# Patient Record
Sex: Female | Born: 1935 | Race: White | Hispanic: No | Marital: Single | Smoking: Former smoker
Health system: Southern US, Community
[De-identification: ages and names within clinical notes are randomized; demographics above are authoritative.]

## PROBLEM LIST (undated history)

## (undated) DIAGNOSIS — E079 Disorder of thyroid, unspecified: Secondary | ICD-10-CM

## (undated) DIAGNOSIS — T4145XA Adverse effect of unspecified anesthetic, initial encounter: Secondary | ICD-10-CM

## (undated) DIAGNOSIS — J9610 Chronic respiratory failure, unspecified whether with hypoxia or hypercapnia: Secondary | ICD-10-CM

## (undated) DIAGNOSIS — J189 Pneumonia, unspecified organism: Secondary | ICD-10-CM

## (undated) DIAGNOSIS — G10 Huntington's disease: Secondary | ICD-10-CM

## (undated) DIAGNOSIS — I4891 Unspecified atrial fibrillation: Secondary | ICD-10-CM

## (undated) DIAGNOSIS — K219 Gastro-esophageal reflux disease without esophagitis: Secondary | ICD-10-CM

## (undated) DIAGNOSIS — T8859XA Other complications of anesthesia, initial encounter: Secondary | ICD-10-CM

## (undated) HISTORY — PX: TONSILLECTOMY: SUR1361

## (undated) HISTORY — PX: APPENDECTOMY: SHX54

## (undated) HISTORY — DX: Disorder of thyroid, unspecified: E07.9

## (undated) HISTORY — PX: HEMORRHOID SURGERY: SHX153

## (undated) HISTORY — PX: ABDOMINAL HYSTERECTOMY: SHX81

## (undated) HISTORY — DX: Gastro-esophageal reflux disease without esophagitis: K21.9

## (undated) HISTORY — PX: COLON SURGERY: SHX602

---

## 1997-10-24 ENCOUNTER — Encounter (HOSPITAL_COMMUNITY): Admission: RE | Admit: 1997-10-24 | Discharge: 1998-01-22 | Payer: Self-pay | Admitting: Cardiology

## 1998-01-23 ENCOUNTER — Encounter (HOSPITAL_COMMUNITY): Admission: RE | Admit: 1998-01-23 | Discharge: 1998-04-23 | Payer: Self-pay | Admitting: Cardiology

## 1998-06-13 ENCOUNTER — Other Ambulatory Visit: Admission: RE | Admit: 1998-06-13 | Discharge: 1998-06-13 | Payer: Self-pay | Admitting: *Deleted

## 1998-08-03 ENCOUNTER — Other Ambulatory Visit: Admission: RE | Admit: 1998-08-03 | Discharge: 1998-08-03 | Payer: Self-pay | Admitting: Urology

## 1999-06-25 ENCOUNTER — Other Ambulatory Visit: Admission: RE | Admit: 1999-06-25 | Discharge: 1999-06-25 | Payer: Self-pay | Admitting: *Deleted

## 2001-04-28 ENCOUNTER — Ambulatory Visit (HOSPITAL_COMMUNITY): Admission: RE | Admit: 2001-04-28 | Discharge: 2001-04-28 | Payer: Self-pay | Admitting: Gastroenterology

## 2001-06-21 ENCOUNTER — Emergency Department (HOSPITAL_COMMUNITY): Admission: EM | Admit: 2001-06-21 | Discharge: 2001-06-21 | Payer: Self-pay | Admitting: Emergency Medicine

## 2001-06-21 ENCOUNTER — Encounter: Payer: Self-pay | Admitting: Emergency Medicine

## 2002-09-24 ENCOUNTER — Emergency Department (HOSPITAL_COMMUNITY): Admission: EM | Admit: 2002-09-24 | Discharge: 2002-09-25 | Payer: Self-pay | Admitting: Emergency Medicine

## 2002-09-24 ENCOUNTER — Encounter: Payer: Self-pay | Admitting: Emergency Medicine

## 2002-12-07 ENCOUNTER — Encounter: Admission: RE | Admit: 2002-12-07 | Discharge: 2002-12-07 | Payer: Self-pay | Admitting: Geriatric Medicine

## 2002-12-07 ENCOUNTER — Encounter: Payer: Self-pay | Admitting: Geriatric Medicine

## 2003-06-10 ENCOUNTER — Emergency Department (HOSPITAL_COMMUNITY): Admission: EM | Admit: 2003-06-10 | Discharge: 2003-06-11 | Payer: Self-pay | Admitting: Emergency Medicine

## 2005-12-23 ENCOUNTER — Encounter: Admission: RE | Admit: 2005-12-23 | Discharge: 2005-12-23 | Payer: Self-pay | Admitting: Geriatric Medicine

## 2007-02-16 ENCOUNTER — Ambulatory Visit (HOSPITAL_COMMUNITY): Admission: RE | Admit: 2007-02-16 | Discharge: 2007-02-16 | Payer: Self-pay | Admitting: Geriatric Medicine

## 2007-06-01 ENCOUNTER — Encounter: Admission: RE | Admit: 2007-06-01 | Discharge: 2007-06-01 | Payer: Self-pay | Admitting: Geriatric Medicine

## 2009-03-01 ENCOUNTER — Encounter: Admission: RE | Admit: 2009-03-01 | Discharge: 2009-03-01 | Payer: Self-pay | Admitting: Geriatric Medicine

## 2009-04-12 ENCOUNTER — Encounter: Admission: RE | Admit: 2009-04-12 | Discharge: 2009-04-12 | Payer: Self-pay | Admitting: Internal Medicine

## 2009-06-21 ENCOUNTER — Emergency Department (HOSPITAL_COMMUNITY): Admission: EM | Admit: 2009-06-21 | Discharge: 2009-06-21 | Payer: Self-pay | Admitting: Emergency Medicine

## 2010-02-25 ENCOUNTER — Emergency Department (HOSPITAL_BASED_OUTPATIENT_CLINIC_OR_DEPARTMENT_OTHER): Admission: EM | Admit: 2010-02-25 | Discharge: 2010-02-25 | Payer: Self-pay | Admitting: Emergency Medicine

## 2010-02-25 ENCOUNTER — Ambulatory Visit: Payer: Self-pay | Admitting: Diagnostic Radiology

## 2010-12-13 NOTE — Procedures (Signed)
[facility] [place]  Patient:    Sydney [name], Sydney [name] Visit Number: [id number] MRN: [id number]          Service Type: Attending:  Verlin Grills, M.D. Dictated by:   Verlin Grills, M.D. Proc. Date: 04/28/01   CC:         Hal T. Stoneking, M.D.  Julieanne Manson, M.D.  Gita Kudo, M.D.   Procedure Report  PROCEDURE:  Colonoscopy.  INDICATIONS FOR PROCEDURE:  The patient (date of birth, May 20, 1935) is a 76 year old female with Huntington chorea, and chronic atrial fibrillation. She chronically takes Coumadin.  In April 1999, she underwent a segmental sigmoid colon resection to remove a tubulovillous adenoma of the sigmoid colon.  She is due for surveillance colonoscopy with polypectomy to prevent colon cancer.  ENDOSCOPIST:  Verlin Grills, M.D.  PREMEDICATION:  Fentanyl 50 mcg and Versed 5 mg.  ENDOSCOPE:  Olympus pediatric colonoscope.  DESCRIPTION OF PROCEDURE:  After obtaining informed consent, the patient was placed in the left lateral decubitus position.  I administered intravenous Versed and intravenous fentanyl to achieve conscious sedation for the procedure.  The patients blood pressure, oxygen saturation, and cardiac rhythm were monitored throughout the procedure, and documented in the medical record.  Anal inspection was normal.  Digital rectal exam was normal.  The Olympus pediatric video colonoscope was introduced into the rectum and easily advanced to the cecum.  Colonic preparation was excellent for the exam today.  The patient consumed a Fleets Phospho-Soda prep.  Rectum normal.  Sigmoid colon and descending colon normal.  The surgical anastomosis was easily identified and appeared completely normal.  Splenic flexure normal.  Transverse colon normal.  Hepatic flexure normal.  Ascending colon normal.  Cecum and ileocecal valve normal.  ASSESSMENT:  Normal proctocolonoscopy to the cecum, post  removal of a sigmoid tubulovillous adenoma in April 1999.  RECOMMENDATIONS:  Repeat colonoscopy in approximately five years. Dictated by:   Verlin Grills, M.D. Attending:  Verlin Grills, M.D. DD:  04/28/01 TD:  04/28/01 Job: [id number]

## 2011-05-10 ENCOUNTER — Encounter: Payer: Self-pay | Admitting: *Deleted

## 2011-05-10 ENCOUNTER — Emergency Department (HOSPITAL_BASED_OUTPATIENT_CLINIC_OR_DEPARTMENT_OTHER)
Admission: EM | Admit: 2011-05-10 | Discharge: 2011-05-10 | Disposition: A | Discharge status: Home / Self Care | Payer: Medicare Other | Attending: Emergency Medicine | Admitting: Emergency Medicine

## 2011-05-10 DIAGNOSIS — I4891 Unspecified atrial fibrillation: Secondary | ICD-10-CM | POA: Insufficient documentation

## 2011-05-10 DIAGNOSIS — Z79899 Other long term (current) drug therapy: Secondary | ICD-10-CM | POA: Insufficient documentation

## 2011-05-10 DIAGNOSIS — S61209A Unspecified open wound of unspecified finger without damage to nail, initial encounter: Secondary | ICD-10-CM | POA: Insufficient documentation

## 2011-05-10 DIAGNOSIS — S61219A Laceration without foreign body of unspecified finger without damage to nail, initial encounter: Secondary | ICD-10-CM

## 2011-05-10 DIAGNOSIS — Y92009 Unspecified place in unspecified non-institutional (private) residence as the place of occurrence of the external cause: Secondary | ICD-10-CM | POA: Insufficient documentation

## 2011-05-10 DIAGNOSIS — W268XXA Contact with other sharp object(s), not elsewhere classified, initial encounter: Secondary | ICD-10-CM | POA: Insufficient documentation

## 2011-05-10 DIAGNOSIS — G10 Huntington's disease: Secondary | ICD-10-CM | POA: Insufficient documentation

## 2011-05-10 HISTORY — DX: Huntington's disease: G10

## 2011-05-10 HISTORY — DX: Unspecified atrial fibrillation: I48.91

## 2011-05-10 MED ORDER — "THROMBI-PAD 3""X3"" EX PADS"
MEDICATED_PAD | CUTANEOUS | Status: AC
  Filled 2011-05-10: qty 1

## 2011-05-10 MED ORDER — ACETAMINOPHEN 325 MG PO TABS
ORAL_TABLET | ORAL | Status: AC
  Administered 2011-05-10: 23:00:00
  Filled 2011-05-10: qty 2

## 2011-05-10 MED ORDER — "THROMBI-PAD 3""X3"" EX PADS"
1.0000 | MEDICATED_PAD | Freq: Once | CUTANEOUS | Status: AC
  Administered 2011-05-10: 1 via TOPICAL

## 2011-05-10 NOTE — ED Provider Notes (Signed)
Medical screening examination/treatment/procedure(s) were conducted as a shared visit with non-physician practitioner(s) and myself.  I personally evaluated the patient during the encounter  Patient with general choreoathetotic movements.  Hanley Seamen, MD 05/10/11 2325

## 2011-05-10 NOTE — ED Provider Notes (Signed)
History     CSN: [id number] Arrival date & time: 05/10/2011 10:02 PM  Chief Complaint  Patient presents with  . Finger Injury    (Consider location/radiation/quality/duration/timing/severity/associated sxs/prior treatment) HPI Comments: Caregiver states that she was trying to cut the pts nails and the patient moved and she cut the top of the finger  Patient is a 76 y.o. female presenting with skin laceration. The history is provided by the patient and a caregiver. No language interpreter was used.  Laceration  The incident occurred 1 to 2 hours ago. The laceration is located on the left hand. The laceration is 1 cm in size. Injury mechanism: having fingernails cut. The pain is mild. The pain has been constant since onset. She reports no foreign bodies present. Her tetanus status is UTD.    Past Medical History  Diagnosis Date  . Huntington disease   . Atrial fibrillation     Past Surgical History  Procedure Date  . Colon surgery   . Abdominal hysterectomy   . Hemorrhoid surgery   . Tonsillectomy   . Appendectomy     No family history on file.  History  Substance Use Topics  . Smoking status: Former Games developer  . Smokeless tobacco: Not on file  . Alcohol Use:     OB History    Grav Para Term Preterm Abortions TAB SAB Ect Mult Living                  Review of Systems  Constitutional: Negative.   Respiratory: Negative.   Cardiovascular: Negative.   Skin:       Skin laceration to left pinky finger    Allergies  Codeine and Penicillins cross reactors  Home Medications   Current Outpatient Rx  Name Route Sig Dispense Refill  . ALPRAZOLAM 0.25 MG PO TABS Oral Take 0.25 mg by mouth every 8 (eight) hours as needed. For anxiety     . ASPIRIN EC 81 MG PO TBEC Oral Take 81 mg by mouth daily.      Marland Kitchen CLONAZEPAM 0.5 MG PO TABS Oral Take 0.5 mg by mouth at bedtime.      Marland Kitchen COENZYME Q10 200 MG PO CAPS Oral Take 200 mg by mouth daily.      Marland Kitchen DIGOXIN 0.25 MG PO TABS Oral  Take 250 mcg by mouth daily.      Marland Kitchen ESTROGENS CONJUGATED 0.625 MG PO TABS Oral Take 0.625 mg by mouth daily. Take daily for 25 days.     Marland Kitchen FLUOXETINE HCL 20 MG PO CAPS Oral Take 20 mg by mouth daily.      Marland Kitchen HALOPERIDOL 0.5 MG PO TABS Oral Take 0.5 mg by mouth 2 (two) times daily.      . IBUPROFEN 800 MG PO TABS Oral Take 800 mg by mouth every 8 (eight) hours as needed. For pain     . LANSOPRAZOLE 30 MG PO CPDR Oral Take 30 mg by mouth daily.      Marland Kitchen LEVOTHYROXINE SODIUM 50 MCG PO TABS Oral Take 50 mcg by mouth daily.      Marland Kitchen MECLIZINE HCL 12.5 MG PO TABS Oral Take 12.5 mg by mouth 3 (three) times daily as needed. For vertigo        BP 116/67  Pulse 78  Temp(Src) 98.3 F (36.8 C) (Oral)  Resp 20  Ht 5\' 6"  (1.676 m)  Wt 110 lb (49.896 kg)  BMI 17.75 kg/m2  SpO2 97%  Physical Exam  Nursing note  and vitals reviewed. Constitutional: She appears well-developed and well-nourished.  HENT:  Head: Normocephalic.  Cardiovascular: Normal rate and regular rhythm.   Pulmonary/Chest: Effort normal and breath sounds normal.  Musculoskeletal:       Pt has jerking movements  Skin:       Pt has a laceration to the tip of the left fingernail    ED Course  LACERATION REPAIR Date/Time: 05/10/2011 10:46 PM Performed by: Teressa Lower Authorized by: Teressa Lower Consent: Verbal consent obtained. Risks and benefits: risks, benefits and alternatives were discussed Consent given by: patient Patient understanding: patient states understanding of the procedure being performed Body area: upper extremity Location details: left hand Laceration length: 1 cm Foreign bodies: no foreign bodies Tendon involvement: none Irrigation solution: saline Skin closure: glue and Steri-Strips   (including critical care time)  Labs Reviewed - No data to display No results found.   No diagnosis found.    MDM  Wound repaired without any problem        Teressa Lower, NP 05/10/11 2301

## 2011-05-10 NOTE — ED Notes (Signed)
Family member was cutting pt's fingernails and pt jerked, causing her to clip the end of her finger. Would not stop bleeding. EMS came and bandaged finger.

## 2011-09-23 ENCOUNTER — Emergency Department (HOSPITAL_COMMUNITY)

## 2011-09-23 ENCOUNTER — Inpatient Hospital Stay (HOSPITAL_COMMUNITY)
Admission: EM | Admit: 2011-09-23 | Discharge: 2011-10-02 | DRG: 444 | Disposition: A | Discharge status: Hospice | Attending: Family Medicine | Admitting: Family Medicine

## 2011-09-23 ENCOUNTER — Encounter (HOSPITAL_COMMUNITY): Payer: Self-pay | Admitting: Emergency Medicine

## 2011-09-23 ENCOUNTER — Other Ambulatory Visit: Payer: Self-pay

## 2011-09-23 DIAGNOSIS — R110 Nausea: Secondary | ICD-10-CM | POA: Diagnosis present

## 2011-09-23 DIAGNOSIS — F29 Unspecified psychosis not due to a substance or known physiological condition: Secondary | ICD-10-CM | POA: Diagnosis not present

## 2011-09-23 DIAGNOSIS — I4891 Unspecified atrial fibrillation: Secondary | ICD-10-CM | POA: Diagnosis present

## 2011-09-23 DIAGNOSIS — R627 Adult failure to thrive: Secondary | ICD-10-CM | POA: Diagnosis present

## 2011-09-23 DIAGNOSIS — J9610 Chronic respiratory failure, unspecified whether with hypoxia or hypercapnia: Secondary | ICD-10-CM | POA: Diagnosis present

## 2011-09-23 DIAGNOSIS — J189 Pneumonia, unspecified organism: Secondary | ICD-10-CM

## 2011-09-23 DIAGNOSIS — K8010 Calculus of gallbladder with chronic cholecystitis without obstruction: Secondary | ICD-10-CM | POA: Diagnosis present

## 2011-09-23 DIAGNOSIS — G10 Huntington's disease: Secondary | ICD-10-CM | POA: Diagnosis present

## 2011-09-23 DIAGNOSIS — Z87440 Personal history of urinary (tract) infections: Secondary | ICD-10-CM

## 2011-09-23 DIAGNOSIS — K5289 Other specified noninfective gastroenteritis and colitis: Secondary | ICD-10-CM | POA: Diagnosis present

## 2011-09-23 DIAGNOSIS — K560 Paralytic ileus: Secondary | ICD-10-CM | POA: Diagnosis not present

## 2011-09-23 DIAGNOSIS — K8000 Calculus of gallbladder with acute cholecystitis without obstruction: Principal | ICD-10-CM | POA: Diagnosis present

## 2011-09-23 DIAGNOSIS — R42 Dizziness and giddiness: Secondary | ICD-10-CM | POA: Diagnosis present

## 2011-09-23 DIAGNOSIS — Z66 Do not resuscitate: Secondary | ICD-10-CM | POA: Diagnosis present

## 2011-09-23 DIAGNOSIS — J690 Pneumonitis due to inhalation of food and vomit: Secondary | ICD-10-CM | POA: Diagnosis present

## 2011-09-23 DIAGNOSIS — D72829 Elevated white blood cell count, unspecified: Secondary | ICD-10-CM | POA: Diagnosis present

## 2011-09-23 HISTORY — DX: Other complications of anesthesia, initial encounter: T88.59XA

## 2011-09-23 HISTORY — DX: Chronic respiratory failure, unspecified whether with hypoxia or hypercapnia: J96.10

## 2011-09-23 HISTORY — DX: Adverse effect of unspecified anesthetic, initial encounter: T41.45XA

## 2011-09-23 LAB — TROPONIN I: Troponin I: <0.3 ng/mL (ref <0.30)

## 2011-09-23 LAB — URINALYSIS, ROUTINE W REFLEX MICROSCOPIC
Bilirubin Urine: NEGATIVE
Glucose, UA: NEGATIVE mg/dL
Urobilinogen, UA: 0.2 mg/dL (ref 0.0–1.0)
pH: 8 (ref 5.0–8.0)

## 2011-09-23 LAB — CBC
Hemoglobin: 13.7 g/dL (ref 12.0–15.0)
MCHC: 33.5 g/dL (ref 30.0–36.0)
MCV: 91.7 fL (ref 78.0–100.0)
Platelets: 372 10*3/uL (ref 150–400)
RDW: 13.5 % (ref 11.5–15.5)

## 2011-09-23 LAB — COMPREHENSIVE METABOLIC PANEL
AST: 10 U/L (ref 0–37)
CO2: 33 mEq/L — ABNORMAL HIGH (ref 19–32)
Calcium: 9.6 mg/dL (ref 8.4–10.5)
Chloride: 96 mEq/L (ref 96–112)
Glucose, Bld: 103 mg/dL — ABNORMAL HIGH (ref 70–99)
Sodium: 138 mEq/L (ref 135–145)
Total Bilirubin: 0.3 mg/dL (ref 0.3–1.2)

## 2011-09-23 LAB — DIFFERENTIAL
Basophils Relative: 0 % (ref 0–1)
Eosinophils Relative: 0 % (ref 0–5)
Lymphocytes Relative: 6 % — ABNORMAL LOW (ref 12–46)

## 2011-09-23 LAB — URINE MICROSCOPIC-ADD ON

## 2011-09-23 MED ORDER — IPRATROPIUM BROMIDE 0.02 % IN SOLN: 0.5000 mg | RESPIRATORY_TRACT | Status: DC | PRN

## 2011-09-23 MED ORDER — ONDANSETRON HCL 4 MG/2ML IJ SOLN
4.0000 mg | Freq: Once | INTRAMUSCULAR | Status: AC
  Administered 2011-09-23: 4 mg via INTRAVENOUS
  Filled 2011-09-23: qty 2

## 2011-09-23 MED ORDER — ALPRAZOLAM 0.25 MG PO TABS
0.2500 mg | ORAL_TABLET | Freq: Three times a day (TID) | ORAL | Status: DC | PRN
  Administered 2011-09-24 – 2011-09-28 (×3): 0.25 mg via ORAL
  Filled 2011-09-23 (×3): qty 1

## 2011-09-23 MED ORDER — ONDANSETRON 8 MG PO TBDP
8.0000 mg | ORAL_TABLET | Freq: Once | ORAL | Status: AC
  Administered 2011-09-23: 8 mg via ORAL
  Filled 2011-09-23: qty 1

## 2011-09-23 MED ORDER — CLONAZEPAM 1 MG PO TABS
1.0000 mg | ORAL_TABLET | Freq: Every day | ORAL | Status: DC
  Administered 2011-09-24 – 2011-10-01 (×8): 1 mg via ORAL
  Filled 2011-09-23 (×8): qty 1

## 2011-09-23 MED ORDER — SODIUM CHLORIDE 0.9 % IV BOLUS (SEPSIS)
500.0000 mL | Freq: Once | INTRAVENOUS | Status: AC
  Administered 2011-09-23: 500 mL via INTRAVENOUS

## 2011-09-23 MED ORDER — DIGOXIN 250 MCG PO TABS
250.0000 ug | ORAL_TABLET | Freq: Every day | ORAL | Status: DC
  Administered 2011-09-24 – 2011-10-02 (×9): 250 ug via ORAL
  Filled 2011-09-23 (×10): qty 1

## 2011-09-23 MED ORDER — FLUOXETINE HCL 20 MG PO CAPS
20.0000 mg | ORAL_CAPSULE | Freq: Every day | ORAL | Status: DC
  Administered 2011-09-24 – 2011-10-02 (×9): 20 mg via ORAL
  Filled 2011-09-23 (×10): qty 1

## 2011-09-23 MED ORDER — SENNOSIDES-DOCUSATE SODIUM 8.6-50 MG PO TABS
2.0000 | ORAL_TABLET | Freq: Two times a day (BID) | ORAL | Status: DC
Start: 2011-09-23 — End: 2011-10-02
  Administered 2011-09-24 – 2011-10-02 (×17): 2 via ORAL
  Filled 2011-09-23 (×21): qty 2

## 2011-09-23 MED ORDER — ASPIRIN EC 81 MG PO TBEC
81.0000 mg | DELAYED_RELEASE_TABLET | Freq: Every day | ORAL | Status: DC
  Administered 2011-09-24 – 2011-10-02 (×8): 81 mg via ORAL
  Filled 2011-09-23 (×10): qty 1

## 2011-09-23 MED ORDER — POTASSIUM CHLORIDE IN NACL 20-0.9 MEQ/L-% IV SOLN
INTRAVENOUS | Status: DC
  Administered 2011-09-24: 75 mL via INTRAVENOUS
  Administered 2011-09-24 – 2011-09-25 (×3): via INTRAVENOUS
  Administered 2011-09-25: 100 mL/h via INTRAVENOUS
  Administered 2011-09-26: 09:00:00 via INTRAVENOUS
  Filled 2011-09-23 (×8): qty 1000

## 2011-09-23 MED ORDER — ACETAMINOPHEN 325 MG PO TABS
650.0000 mg | ORAL_TABLET | Freq: Once | ORAL | Status: AC
  Administered 2011-09-23: 650 mg via ORAL
  Filled 2011-09-23: qty 2

## 2011-09-23 MED ORDER — ENOXAPARIN SODIUM 40 MG/0.4ML ~~LOC~~ SOLN
40.0000 mg | Freq: Every day | SUBCUTANEOUS | Status: DC
  Administered 2011-09-24 (×2): 40 mg via SUBCUTANEOUS
  Filled 2011-09-23 (×4): qty 0.4

## 2011-09-23 MED ORDER — SCOPOLAMINE 1 MG/3DAYS TD PT72
1.0000 | MEDICATED_PATCH | TRANSDERMAL | Status: DC
  Administered 2011-09-24 – 2011-09-29 (×3): 1.5 mg via TRANSDERMAL
  Filled 2011-09-23 (×5): qty 1

## 2011-09-23 MED ORDER — MORPHINE SULFATE 2 MG/ML IJ SOLN
2.0000 mg | Freq: Once | INTRAMUSCULAR | Status: AC
  Administered 2011-09-23: 2 mg via INTRAVENOUS
  Filled 2011-09-23: qty 1

## 2011-09-23 MED ORDER — POLYETHYLENE GLYCOL 3350 17 G PO PACK
17.0000 g | PACK | Freq: Every day | ORAL | Status: DC | PRN
  Administered 2011-09-28: 17 g via ORAL
  Filled 2011-09-23 (×2): qty 1

## 2011-09-23 MED ORDER — NITROGLYCERIN 0.4 MG SL SUBL: 0.4000 mg | SUBLINGUAL_TABLET | SUBLINGUAL | Status: DC | PRN

## 2011-09-23 MED ORDER — HALOPERIDOL 0.5 MG PO TABS
0.5000 mg | ORAL_TABLET | Freq: Two times a day (BID) | ORAL | Status: DC
  Administered 2011-09-24 – 2011-09-29 (×12): 0.5 mg via ORAL
  Filled 2011-09-23 (×14): qty 1

## 2011-09-23 MED ORDER — IBUPROFEN 800 MG PO TABS
800.0000 mg | ORAL_TABLET | Freq: Three times a day (TID) | ORAL | Status: DC | PRN
  Administered 2011-09-25 – 2011-09-26 (×2): 800 mg via ORAL
  Filled 2011-09-23 (×3): qty 1

## 2011-09-23 MED ORDER — ALBUTEROL SULFATE (5 MG/ML) 0.5% IN NEBU: 2.5000 mg | INHALATION_SOLUTION | RESPIRATORY_TRACT | Status: DC | PRN

## 2011-09-23 MED ORDER — MECLIZINE HCL 25 MG PO TABS
25.0000 mg | ORAL_TABLET | Freq: Three times a day (TID) | ORAL | Status: DC | PRN
  Administered 2011-09-27: 25 mg via ORAL
  Filled 2011-09-23 (×2): qty 1

## 2011-09-23 MED ORDER — ONDANSETRON HCL 4 MG/2ML IJ SOLN
4.0000 mg | Freq: Four times a day (QID) | INTRAMUSCULAR | Status: DC | PRN
Start: 2011-09-23 — End: 2011-10-02
  Administered 2011-09-24 – 2011-10-01 (×4): 4 mg via INTRAVENOUS
  Filled 2011-09-23 (×4): qty 2

## 2011-09-23 MED ORDER — LEVOFLOXACIN IN D5W 750 MG/150ML IV SOLN
750.0000 mg | INTRAVENOUS | Status: DC
  Administered 2011-09-23 – 2011-09-25 (×3): 750 mg via INTRAVENOUS
  Filled 2011-09-23 (×4): qty 150

## 2011-09-23 MED ORDER — LEVOTHYROXINE SODIUM 50 MCG PO TABS
50.0000 ug | ORAL_TABLET | Freq: Every day | ORAL | Status: DC
  Administered 2011-09-24 – 2011-10-02 (×8): 50 ug via ORAL
  Filled 2011-09-23 (×10): qty 1

## 2011-09-23 MED ORDER — PANTOPRAZOLE SODIUM 40 MG PO TBEC
40.0000 mg | DELAYED_RELEASE_TABLET | Freq: Every day | ORAL | Status: DC
  Administered 2011-09-24 – 2011-10-02 (×8): 40 mg via ORAL
  Filled 2011-09-23 (×10): qty 1

## 2011-09-23 MED ORDER — ESTROGENS CONJUGATED 0.625 MG PO TABS
0.6250 mg | ORAL_TABLET | Freq: Every day | ORAL | Status: DC
  Administered 2011-09-26 – 2011-10-02 (×7): 0.625 mg via ORAL
  Filled 2011-09-23 (×8): qty 1

## 2011-09-23 MED ORDER — IOHEXOL 300 MG/ML  SOLN
80.0000 mL | Freq: Once | INTRAMUSCULAR | Status: AC | PRN
  Administered 2011-09-23: 80 mL via INTRAVENOUS

## 2011-09-23 MED ORDER — ONDANSETRON 8 MG PO TBDP
8.0000 mg | ORAL_TABLET | Freq: Three times a day (TID) | ORAL | Status: DC | PRN
  Filled 2011-09-23: qty 1

## 2011-09-23 NOTE — ED Notes (Signed)
1st attempt made to call report to 3E. RN unavailable at this time. Plan to call back in 5-10 minutes.

## 2011-09-23 NOTE — ED Provider Notes (Signed)
History     CSN: [id number]  Arrival date & time 09/23/11  1523   First MD Initiated Contact with Patient 09/23/11 1625      Chief Complaint  Patient presents with  . Nausea    vomitting     Patient is a 77 y.o. female presenting with vomiting. The history is provided by the patient and a caregiver.  Emesis  This is a new problem. The current episode started yesterday. The problem occurs 5 to 10 times per day. The problem has been gradually worsening. The emesis has an appearance of stomach contents. The maximum temperature recorded prior to her arrival was 100 to 100.9 F. Associated symptoms include abdominal pain, chills and cough. Pertinent negatives include no diarrhea.  Patient arrives from home She has h/o huntington disease, she is on hospice and is a DNR She has a home caregiver She has had vomiting, low grade fever for past 24 hours She also reports abdominal pain  Past Medical History  Diagnosis Date  . Huntington disease   . Atrial fibrillation     Past Surgical History  Procedure Date  . Colon surgery   . Abdominal hysterectomy   . Hemorrhoid surgery   . Tonsillectomy   . Appendectomy     No family history on file.  History  Substance Use Topics  . Smoking status: Former Games developer  . Smokeless tobacco: Not on file  . Alcohol Use:     OB History    Grav Para Term Preterm Abortions TAB SAB Ect Mult Living                  Review of Systems  Constitutional: Positive for chills.  Respiratory: Positive for cough.   Gastrointestinal: Positive for vomiting and abdominal pain. Negative for diarrhea.  All other systems reviewed and are negative.    Allergies  Codeine and Penicillins cross reactors  Home Medications   Current Outpatient Rx  Name Route Sig Dispense Refill  . ALPRAZOLAM 0.25 MG PO TABS Oral Take 0.25 mg by mouth every 8 (eight) hours as needed. For anxiety     . ASPIRIN EC 81 MG PO TBEC Oral Take 81 mg by mouth daily.      Marland Kitchen  CLONAZEPAM 0.5 MG PO TABS Oral Take 1 mg by mouth at bedtime.     Marland Kitchen DIGOXIN 0.25 MG PO TABS Oral Take 250 mcg by mouth daily.      Marland Kitchen ESTROGENS CONJUGATED 0.625 MG PO TABS Oral Take 0.625 mg by mouth See admin instructions. Taken daily for first 25 days of each month.    . FLUOXETINE HCL 20 MG PO CAPS Oral Take 20 mg by mouth daily.      Marland Kitchen HALOPERIDOL 0.5 MG PO TABS Oral Take 0.5 mg by mouth 2 (two) times daily.      . IBUPROFEN 800 MG PO TABS Oral Take 800 mg by mouth every 8 (eight) hours as needed. For pain     . LANSOPRAZOLE 30 MG PO CPDR Oral Take 30 mg by mouth 2 (two) times daily.    Marland Kitchen LEVOTHYROXINE SODIUM 50 MCG PO TABS Oral Take 50 mcg by mouth daily.      Marland Kitchen MECLIZINE HCL 25 MG PO TABS Oral Take 25 mg by mouth 3 (three) times daily as needed. For dizziness/nausea.    Marland Kitchen NITROGLYCERIN 0.4 MG SL SUBL Sublingual Place 0.4 mg under the tongue every 5 (five) minutes x 3 doses as needed.    Marland Kitchen  ONDANSETRON 8 MG PO TBDP Oral Take 8 mg by mouth every 8 (eight) hours as needed. For nausea.    Marland Kitchen POLYETHYLENE GLYCOL 3350 PO PACK Oral Take 17 g by mouth daily as needed. For constipation.    Marland Kitchen PROMETHAZINE HCL 25 MG RE SUPP Rectal Place 25 mg rectally every 8 (eight) hours as needed. For nausea.    . SCOPOLAMINE BASE 1.5 MG TD PT72 Transdermal Place 1 patch onto the skin every 3 (three) days.    Bernadette Hoit SODIUM 8.6-50 MG PO TABS Oral Take 2 tablets by mouth 2 (two) times daily.      BP 143/80  Pulse 87  Temp 100.1 F (37.8 C)  Resp 20  SpO2 91% BP 137/81  Pulse 80  Temp(Src) 99 F (37.2 C) (Axillary)  Resp 20  SpO2 98%   Physical Exam CONSTITUTIONAL: Well developed/well nourished HEAD AND FACE: Normocephalic/atraumatic EYES: EOMI/PERRL, no scleral icterus ENMT: Mucous membranes dry NECK: supple no meningeal signs CV: no loud murmurs noted LUNGS: Lungs are clear to auscultation bilaterally, no apparent distress ABDOMEN: soft, diffuse tenderness noted with voluntary  guarding.  No rigidity is noted GU:no cva tenderness NEURO: Pt is awake/alert, moves all extremitiesx4 EXTREMITIES: pulses normal, full ROM SKIN: warm, color normal PSYCH:flat affect  ED Course  Procedures   Labs Reviewed  CBC - Abnormal; Notable for the following:    WBC 21.0 (*)    All other components within normal limits  DIFFERENTIAL - Abnormal; Notable for the following:    Neutrophils Relative 88 (*)    Neutro Abs 18.5 (*)    Lymphocytes Relative 6 (*)    Monocytes Absolute 1.2 (*)    All other components within normal limits  COMPREHENSIVE METABOLIC PANEL  LIPASE, BLOOD  URINALYSIS, ROUTINE W REFLEX MICROSCOPIC  DIGOXIN LEVEL   Dg Chest 2 View  09/23/2011  *RADIOLOGY REPORT*  Clinical Data: Confusion and weakness.  CHEST - 2 VIEW  Comparison: None.  Findings: There is cardiomegaly.  Lung volumes are low with small bilateral pleural effusions basilar airspace disease, worse on the left.  No pneumothorax is identified.  IMPRESSION: Small bilateral pleural effusions and basilar airspace disease, worse on the left, could reflect atelectasis or pneumonia.  Original Report Authenticated By: Bernadene Bell. D'ALESSIO, M.D.    5:39 PM Pt poor historian but has had vomiting/abd pain for past 24 hrs and her abdomen continues to be tender Will get CT imaging Patient/caregiver updated  9:29 PM CT shows no acute findings, abd soft and vomiting improved She did have pneumonia on CXR Will admit, pt requests admission Pt stabilized in the ED Pt lives at home, this is likely community acquired pneumonia   MDM  Nursing notes reviewed and considered in documentation All labs/vitals reviewed and considered xrays reviewed and considered      Date: 09/23/2011  Rate: 76  Rhythm: normal sinus rhythm  QRS Axis: normal  Intervals: normal  ST/T Wave abnormalities: ST depressions laterally  Conduction Disutrbances:none  Narrative Interpretation:   Old EKG Reviewed: none available at  time of interpretation      Joya Gaskins, MD 09/23/11 2130

## 2011-09-23 NOTE — ED Notes (Signed)
Per EMS-N/V for past 24 hours-unable to keep anything down-was given 4 suppositories since midnight by family with no relief

## 2011-09-23 NOTE — Progress Notes (Signed)
ANTIBIOTIC CONSULT NOTE - INITIAL  Pharmacy Consult for levofloxacin Indication: pneumonia  Allergies  Allergen Reactions  . Codeine Itching  . Penicillins Cross Reactors Hives    Patient Measurements: Height: 5\' 6"  (167.6 cm) Weight: 127 lb (57.607 kg) IBW/kg (Calculated) : 59.3  Adjusted Body Weight:   Vital Signs: Temp: 98.4 F (36.9 C) (02/26 2326) Temp src: Oral (02/26 2326) BP: 119/64 mmHg (02/26 2326) Pulse Rate: 73  (02/26 2326) Intake/Output from previous day:   Intake/Output from this shift:    Labs:  Basename 09/23/11 1550  WBC 21.0*  HGB 13.7  PLT 372  LABCREA --  CREATININE 0.67   Estimated Creatinine Clearance: 55.3 ml/min (by C-G formula based on Cr of 0.67). No results found for this basename: VANCOTROUGH:2,VANCOPEAK:2,VANCORANDOM:2,GENTTROUGH:2,GENTPEAK:2,GENTRANDOM:2,TOBRATROUGH:2,TOBRAPEAK:2,TOBRARND:2,AMIKACINPEAK:2,AMIKACINTROU:2,AMIKACIN:2, in the last 72 hours   Microbiology: No results found for this or any previous visit (from the past 720 hour(s)).  Medical History: Past Medical History  Diagnosis Date  . Huntington disease   . Atrial fibrillation   . Chronic respiratory failure     nocturnal oxygen  . Complication of anesthesia     felt surgery with epidural    Medications:  Anti-infectives     Start     Dose/Rate Route Frequency Ordered Stop   09/23/11 2030   Levofloxacin (LEVAQUIN) IVPB 750 mg        750 mg 100 mL/hr over 90 Minutes Intravenous Every 24 hours 09/23/11 2018 10/01/11 2029         Assessment: Patient with PNA.  MD order for levofloxacin per pharmacy.  CrCl > 53mL/min  Goal of Therapy:  Levofloxacin for 8 days, dose based on renal function  Plan:  Levofloxacin 750mg  iv q24hr x 8 days, follow up plan with MD   Sydney [name], Sydney [name] 09/23/2011,11:49 PM

## 2011-09-23 NOTE — ED Notes (Signed)
Admitting RN at bedside at this time.

## 2011-09-23 NOTE — ED Notes (Signed)
Patient transported to CT 

## 2011-09-23 NOTE — H&P (Addendum)
PCP:   Dr. Langston Masker Neurologist [place] walker at [facility] Complaint:  Nausea and vomiting  HPI: This is a 77 year old female with known history of Huntington's disease. Approximately 3 weeks ago she developed nausea and vomiting and dizziness. It has been intermittent. She was diagnosed with a UTI and treated with 10 days of Cipro. There was a question of aspiration with her emesis and her Huntington's disease. As stated her nausea and vomiting has been intermittent, however, last night it came back very severe. It is bilious in nature. Patient has no diarrhea. She does complain of stomach pain generalized. There is report of subjective fevers and chills. She was prescribed to meclizine, Zofran and Phenergan suppositories by her PCP. These are not controlling her symptoms. History provided by the patient and her power of attorney Eugenio Hoes [phone].  Review of Systems:  anorexia, fever, weight loss,, vision loss, decreased hearing, hoarseness, chest pain, syncope, dyspnea on exertion, peripheral edema, balance deficits, hemoptysis, abdominal pain, melena, hematochezia, severe indigestion/heartburn, hematuria, incontinence, genital sores, muscle weakness, suspicious skin lesions, transient blindness, difficulty walking, depression, unusual weight change, abnormal bleeding, enlarged lymph nodes, angioedema, and breast masses.  Past Medical History: Past Medical History  Diagnosis Date  . Huntington disease   . Atrial fibrillation   . Chronic respiratory failure     nocturnal oxygen  . Complication of anesthesia     felt surgery with epidural   Past Surgical History  Procedure Date  . Colon surgery   . Abdominal hysterectomy   . Hemorrhoid surgery   . Tonsillectomy   . Appendectomy     Medications: Prior to Admission medications   Medication Sig Start Date End Date Taking? Authorizing Provider  ALPRAZolam (XANAX) 0.25 MG tablet Take 0.25 mg by mouth every 8 (eight) hours  as needed. For anxiety    Yes Historical Provider, MD  aspirin EC 81 MG tablet Take 81 mg by mouth daily.     Yes Historical Provider, MD  clonazePAM (KLONOPIN) 0.5 MG tablet Take 1 mg by mouth at bedtime.    Yes Historical Provider, MD  digoxin (LANOXIN) 0.25 MG tablet Take 250 mcg by mouth daily.     Yes Historical Provider, MD  estrogens, conjugated, (PREMARIN) 0.625 MG tablet Take 0.625 mg by mouth See admin instructions. Taken daily for first 25 days of each month.   Yes Historical Provider, MD  FLUoxetine (PROZAC) 20 MG capsule Take 20 mg by mouth daily.     Yes Historical Provider, MD  haloperidol (HALDOL) 0.5 MG tablet Take 0.5 mg by mouth 2 (two) times daily.     Yes Historical Provider, MD  ibuprofen (ADVIL,MOTRIN) 800 MG tablet Take 800 mg by mouth every 8 (eight) hours as needed. For pain    Yes Historical Provider, MD  lansoprazole (PREVACID) 30 MG capsule Take 30 mg by mouth 2 (two) times daily.   Yes Historical Provider, MD  levothyroxine (SYNTHROID, LEVOTHROID) 50 MCG tablet Take 50 mcg by mouth daily.     Yes Historical Provider, MD  meclizine (ANTIVERT) 25 MG tablet Take 25 mg by mouth 3 (three) times daily as needed. For dizziness/nausea.   Yes Historical Provider, MD  nitroGLYCERIN (NITROSTAT) 0.4 MG SL tablet Place 0.4 mg under the tongue every 5 (five) minutes x 3 doses as needed.   Yes Historical Provider, MD  ondansetron (ZOFRAN-ODT) 8 MG disintegrating tablet Take 8 mg by mouth every 8 (eight) hours as needed. For nausea.   Yes  Historical Provider, MD  polyethylene glycol (MIRALAX / GLYCOLAX) packet Take 17 g by mouth daily as needed. For constipation.   Yes Historical Provider, MD  promethazine (PHENERGAN) 25 MG suppository Place 25 mg rectally every 8 (eight) hours as needed. For nausea.   Yes Historical Provider, MD  scopolamine (TRANSDERM-SCOP) 1.5 MG Place 1 patch onto the skin every 3 (three) days.   Yes Historical Provider, MD  senna-docusate (SENOKOT-S) 8.6-50 MG per  tablet Take 2 tablets by mouth 2 (two) times daily.   Yes Historical Provider, MD    Allergies:   Allergies  Allergen Reactions  . Codeine Itching  . Penicillins Cross Reactors Hives    Social History:  reports that she quit smoking about 13 years ago. She has never used smokeless tobacco. She reports that she does not drink alcohol or use illicit drugs. patient lives at home with her power of attorney, she is completely wheelchair-bound, she uses nocturnal oxygen  Family History: Family History  Problem Relation Age of Onset  . Huntington's disease    . Diabetes type II    . Coronary artery disease      Physical Exam: Filed Vitals:   09/23/11 1535 09/23/11 1830  BP: 143/80 137/81  Pulse: 87 80  Temp: 100.1 F (37.8 C) 99 F (37.2 C)  TempSrc:  Axillary  Resp: 20   SpO2: 91% 98%    General:  Alert and oriented times three, well developed and nourished, no acute distress Eyes: PERRLA, pink conjunctiva, no scleral icterus ENT: Moist oral mucosa, neck supple, no thyromegaly Lungs: clear to ascultation, no wheeze, no crackles, no use of accessory muscles Cardiovascular: regular rate and rhythm, no regurgitation, no gallops, no murmurs. No carotid bruits, no JVD Abdomen: soft, positive BS, nonspecific tenderness to palpation, no organomegaly, not an acute abdomen GU: not examined Neuro: CN II - XII grossly intact, sensation intact, Musculoskeletal: strength 5/5 all extremities, no clubbing, cyanosis or edema Skin: no rash, no subcutaneous crepitation, no decubitus Psych: appropriate patient   Labs on Admission:   [facility] 09/23/11 1550  NA 138  K 3.9  CL 96  CO2 33*  GLUCOSE 103*  BUN 15  CREATININE 0.67  CALCIUM 9.6  MG --  PHOS --    Basename 09/23/11 1550  AST 10  ALT 11  ALKPHOS 129*  BILITOT 0.3  PROT 7.6  ALBUMIN 3.4*    Basename 09/23/11 1550  LIPASE 14  AMYLASE --    Basename 09/23/11 1550  WBC 21.0*  NEUTROABS 18.5*  HGB 13.7  HCT  40.9  MCV 91.7  PLT 372    Basename 09/23/11 1802  CKTOTAL --  CKMB --  CKMBINDEX --  TROPONINI <0.30   No components found with this basename: POCBNP:3 No results found for this basename: DDIMER:2 in the last 72 hours No results found for this basename: HGBA1C:2 in the last 72 hours No results found for this basename: CHOL:2,HDL:2,LDLCALC:2,TRIG:2,CHOLHDL:2,LDLDIRECT:2 in the last 72 hours No results found for this basename: TSH,T4TOTAL,FREET3,T3FREE,THYROIDAB in the last 72 hours No results found for this basename: VITAMINB12:2,FOLATE:2,FERRITIN:2,TIBC:2,IRON:2,RETICCTPCT:2 in the last 72 hours  Micro Results: No results found for this or any previous visit (from the past 240 hour(s)). Results for SHEKIRA, DRUMMER (MRN [id number]) as of 09/23/2011 21:53  Ref. Range 09/23/2011 19:11  Color, Urine Latest Range: YELLOW  YELLOW  APPearance Latest Range: CLEAR  CLOUDY (A)  Specific Gravity, Urine Latest Range: 1.005-1.030  1.025  pH Latest Range: 5.0-8.0  8.0  Glucose, UA Latest Range: NEGATIVE mg/dL NEGATIVE  Bilirubin Urine Latest Range: NEGATIVE  NEGATIVE  Ketones, ur Latest Range: NEGATIVE mg/dL NEGATIVE  Protein Latest Range: NEGATIVE mg/dL 30 (A)  Urobilinogen, UA Latest Range: 0.0-1.0 mg/dL 0.2  Nitrite Latest Range: NEGATIVE  NEGATIVE  Leukocytes, UA Latest Range: NEGATIVE  NEGATIVE  Hgb urine dipstick Latest Range: NEGATIVE  TRACE (A)  WBC, UA Latest Range: <3 WBC/hpf 0-2  RBC / HPF Latest Range: <3 RBC/hpf 0-2  Squamous Epithelial / LPF Latest Range: RARE  MANY (A)  Bacteria, UA Latest Range: RARE  RARE    Radiological Exams on Admission: Dg Chest 2 View  09/23/2011  *RADIOLOGY REPORT*  Clinical Data: Confusion and weakness.  CHEST - 2 VIEW  Comparison: None.  Findings: There is cardiomegaly.  Lung volumes are low with small bilateral pleural effusions basilar airspace disease, worse on the left.  No pneumothorax is identified.  IMPRESSION: Small bilateral pleural  effusions and basilar airspace disease, worse on the left, could reflect atelectasis or pneumonia.  Original Report Authenticated By: Bernadene Bell. Maricela Curet, M.D.   Ct Abdomen Pelvis W Contrast  09/23/2011  *RADIOLOGY REPORT*  Clinical Data: Abdominal pain, nausea and vomiting, history of Huntington's disease, hysterectomy, appendectomy  CT ABDOMEN AND PELVIS WITH CONTRAST  Technique:  Multidetector CT imaging of the abdomen and pelvis was performed following the standard protocol during bolus administration of intravenous contrast.  Contrast:  75 ml Omnipaque-300  Comparison: CT abdomen pelvis - 12/07/2002; pelvic ultrasound - 06/01/2007.  Findings:  Normal hepatic contour.  7 mm hypoattenuating lesion within the right lobe of the liver (image 6, series 2) is too small to accurately characterize though favored to represent a hepatic cyst. The gallbladder and cystic duct are distended (the cystic duct measures approximately 1.3 mm in greatest coronal dimension - image 16, series five), however there is no gallbladder wall thickening or pericholecystic fluid.  No radiopaque gallstones.  The common bile duct is of normal caliber measuring 6 mm in diameter (image 33, series 2). No ascites.  There is symmetric enhancement and excretion of the bilateral kidneys.  No urinary obstruction.  No renal lesions.  The bilateral adrenal glands, pancreas and spleen are normal.  Ingested enteric contrast extends to the level of the mid small bowel.  There is mild dilatation of the proximal small bowel with index loop within the left mid hemiabdomen measuring 2.6 mm in diameter (image 46, series 2) without definite evidence of enteric obstruction.  Moderate colonic stool burden.  Scattered colonic diverticulosis without evidence of diverticulitis.  No pneumoperitoneum, pneumatosis or portal venous gas.  Scattered atherosclerotic calcifications of a normal caliber abdominal aorta.  The major branch vessels of the abdominal aorta,  including the IMA, are patent.  No retroperitoneal, mesenteric, pelvic or inguinal lymphadenopathy.  Interval development of bilateral cystic adnexal lesions with left adnexal lesion measuring approximately 4.1 x 2.4 cm (image 73, series 2) and right adnexal lesion measuring approximate 1.8 x 2.1 cm.  Post hysterectomy.  Surgical clips are again seen within the presacral space.  No free fluid in the pelvis.  Limited visualization of the lower thorax demonstrates small bilateral pleural effusions and bibasilar opacities, possibly atelectasis.  Possible air space opacity within the medial segment of the right middle lobe (image six, series four) worrisome for infection.  There is a possible 1.0 x 0.6 cm nodule within the basilar aspect of the right lower lobe (image five), not definitely seen on prior remote examination.  Stable  findings of pectus deformity of the visualized thorax.  No acute aggressive osseous abnormalities.  Degenerative change of the L5 - S1.  IMPRESSION: 1.  Mild dilatation of the proximal small bowel without definite evidence of enteric obstruction. 2.  The gallbladder and cystic duct are distended but otherwise normal.  This may be secondary to reported history of patient's fasting state.  Further evaluation may be obtained with a gallbladder ultrasound as clinically indicated. 3.  Interval development of bilateral cystic adnexal lesions which is an atypical finding in this postmenopausal female.  Further evaluation with pelvic ultrasound may be obtained as clinically indicated.  4.  Colonic diverticulosis without evidence of diverticulitis.  5. Small bilateral effusions with possible airspace opacities within the middle lobe, worrisome for infection.  Clinical correlation is advised. 6.  Possible 1 cm nodule within the basilar aspect of the right lower lobe.  Further evaluation with dedicated chest CT may be obtained as clinically indicated.  Original Report Authenticated By: Waynard Reeds,  M.D.    Assessment/Plan Present on Admission:  .pneumonia -possible aspiration Blood cultures and urine cultures ordered Antibiotics ordered Oxygen and nebulizers as needed Gastroenteritis Antiemetics ordered when necessary IV Protonix ordered Right upper quadrant ultrasound to evaluate gallbladder Huntington's disease Atrial fibrillation Chronic respiratory failure nocturnal oxygen Stable resume home medications Nutrition Will order a regular diet - chopped Patient needs complete assistance with feeding Aspiration precautions  DO NOT RESUSCITATE DVT prophylaxis Team 2/Dr. Rolena Infante, Isidoro Santillana 09/23/2011, 9:53 PM

## 2011-09-23 NOTE — Progress Notes (Signed)
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  Pt is DNR code.    Pt alert & oriented, lying in bed, with complaints of nausea.   2 friends including [name]/PCG present.   Pt to be admitted.  Dr. Pete Glatter is PCP.  Please call [facility] @ [phone] with any needs.  Thank you.  Joneen Boers, RN  [facility]  Hospice Liaison

## 2011-09-24 ENCOUNTER — Inpatient Hospital Stay (HOSPITAL_COMMUNITY)

## 2011-09-24 DIAGNOSIS — R109 Unspecified abdominal pain: Secondary | ICD-10-CM

## 2011-09-24 DIAGNOSIS — R42 Dizziness and giddiness: Secondary | ICD-10-CM | POA: Diagnosis present

## 2011-09-24 DIAGNOSIS — R112 Nausea with vomiting, unspecified: Secondary | ICD-10-CM

## 2011-09-24 DIAGNOSIS — K8010 Calculus of gallbladder with chronic cholecystitis without obstruction: Secondary | ICD-10-CM | POA: Diagnosis present

## 2011-09-24 LAB — PROTIME-INR
INR: 0.97 (ref 0.00–1.49)
Prothrombin Time: 13.1 seconds (ref 11.6–15.2)

## 2011-09-24 LAB — BASIC METABOLIC PANEL
Chloride: 100 mEq/L (ref 96–112)
GFR calc Af Amer: >90 mL/min (ref >90)
GFR calc non Af Amer: 81 mL/min — ABNORMAL LOW (ref >90)
Potassium: 3.7 mEq/L (ref 3.5–5.1)
Sodium: 136 mEq/L (ref 135–145)

## 2011-09-24 LAB — HEPATIC FUNCTION PANEL
ALT: 9 U/L (ref 0–35)
AST: 8 U/L (ref 0–37)
Albumin: 2.8 g/dL — ABNORMAL LOW (ref 3.5–5.2)
Alkaline Phosphatase: 117 U/L (ref 39–117)
Total Protein: 6.5 g/dL (ref 6.0–8.3)

## 2011-09-24 LAB — CBC
HCT: 37.5 % (ref 36.0–46.0)
Hemoglobin: 12.1 g/dL (ref 12.0–15.0)
MCHC: 32.3 g/dL (ref 30.0–36.0)
RBC: 4.09 MIL/uL (ref 3.87–5.11)
WBC: 13.2 10*3/uL — ABNORMAL HIGH (ref 4.0–10.5)

## 2011-09-24 LAB — APTT: aPTT: 37 seconds (ref 24–37)

## 2011-09-24 MED ORDER — ENSURE IMMUNE HEALTH PO LIQD
237.0000 mL | Freq: Three times a day (TID) | ORAL | Status: DC
  Administered 2011-09-24: 13:00:00 via ORAL
  Administered 2011-09-26 – 2011-10-02 (×9): 237 mL via ORAL

## 2011-09-24 NOTE — Progress Notes (Signed)
[facility] [place]  Appears pt has Cholelelithiasis  Causing her N&V, fever  So this will be an unrelated admission as her hospice diagnosis is [place]'s Chorea per Dr Alphonsus Sias.  Pt is sitting up in bed with POA Eugenio Hoes and another friend present. She has had no more vomiting since last evening in the ED after receiving IV Zofran. She is eating small amts and drinking Ensure and tolerating this. Temp this AM 98.2. WBC last night 21,000 down this morning to 13.2. Abd U/S reveals Cholelithiasis with gallbladder wall thickening and mild intrahepatic biliary duct dilitation. She also has mild PNA and UTI which is causing some urinary retention requiring I&O cath this AM and nurse is calling MD now about placing a foley as she has bladder distention and cannot void. She denies any pain except to say "my pee box is broken". Family and pt awaiting return of FPTS to discuss U/S results and discuss POC. Will continue to follow pt and please call with any updates or concerns. [phone]

## 2011-09-24 NOTE — Progress Notes (Signed)
[facility] MSW note: MSW met with Patient(pt), poa/hcpoa-[name], and sue's sister -Dois Davenport. Pt denied pain. Pt fell asleep during visit. Pt is DNR. Pt lives with Fannie Knee. Fannie Knee has has cared for pt for several years as pt's condition has declined. Fannie Knee has reported that pt has had reported some recent problems with pt swallowing at times. MSW explored care plans. Fannie Knee would like to take pt home if she is able to care for pt. Fannie Knee asked about the appropriateness of pt having a catheter at home. MSW encouraged Fannie Knee to discuss this with the doctor. MSW to follow up on care issues and options. MSW offered support and reassurance of continued hospice support.  Elijio Miles, MSW

## 2011-09-24 NOTE — Progress Notes (Signed)
Patient continues to have difficulty urinating despite I+O cath done earlier.  Called MD to notify.  Place foley and if patient has residual keep foley cath in place.

## 2011-09-24 NOTE — Progress Notes (Addendum)
INITIAL ADULT NUTRITION ASSESSMENT Date: 09/24/2011   Time: 10:39 AM Reason for Assessment: Nutrition risk, consult, dysphagia   ASSESSMENT: Female 77 y.o.  Dx: Nausea and vomiting  Hx:  Past Medical History  Diagnosis Date  . Huntington disease   . Atrial fibrillation   . Chronic respiratory failure     nocturnal oxygen  . Complication of anesthesia     felt surgery with epidural   Related Meds:  Scheduled Meds:   . acetaminophen  650 mg Oral Once  . aspirin EC  81 mg Oral Daily  . clonazePAM  1 mg Oral QHS  . digoxin  250 mcg Oral Daily  . enoxaparin  40 mg Subcutaneous QHS  . estrogens (conjugated)  0.625 mg Oral Daily  . FLUoxetine  20 mg Oral Daily  . haloperidol  0.5 mg Oral BID  . levofloxacin (LEVAQUIN) IV  750 mg Intravenous Q24H  . levothyroxine  50 mcg Oral Daily  .  morphine injection  2 mg Intravenous Once  . ondansetron  4 mg Intravenous Once  . ondansetron  8 mg Oral Once  . pantoprazole  40 mg Oral Daily  . scopolamine  1 patch Transdermal Q72H  . senna-docusate  2 tablet Oral BID  . sodium chloride  500 mL Intravenous Once   Continuous Infusions:   . 0.9 % NaCl with KCl 20 mEq / L 75 mL/hr at 09/24/11 0109   PRN Meds:.albuterol, ALPRAZolam, ibuprofen, iohexol, ipratropium, meclizine, nitroGLYCERIN, ondansetron (ZOFRAN) IV, ondansetron, polyethylene glycol  Ht: 5\' 6"  (167.6 cm)  Wt: 127 lb (57.607 kg)  Ideal Wt: 59kg % Ideal Wt: 97  Usual Wt: 54.5kg % Usual Wt: 105  Body mass index is 20.50 kg/(m^2).  Food/Nutrition Related Hx: Pt from home with hospice. Pt unable to answer questions appropriately. Contacted POA to obtain nutrition history, Eugenio Hoes. Fannie Knee reports pt with on/off appetite/intake at home, usual weight of 120 pounds, eating what she wants to at home, no thickened liquids, typically soft foods as pt with only a few teeth. Fannie Knee states pt with 3 week history of intermittent nausea and vomiting with dizziness. Fannie Knee states pt on Ensure  at home, drinks 2-3/day. Fannie Knee states pt weighed 92 pounds last year but has been gaining weight r/t limited mobility. Pt with dysphagia from Huntington disease. RN reports pt had some coughing with breakfast but no difficulty observing when pt taking medications. No vomiting so far today.   [place] of abdomen on 2/27 showed: 1. Cholelithiasis with edema in the gallbladder fossa and  gallbladder wall thickening. Although no sonographic Murphy's sign  is present, acute cholecystitis is considered.  2. Mild intrahepatic biliary duct dilatation.  3. Small right pleural effusion.  CT of abdomen/pelvis on 2/26 showed: 1. Mild dilatation of the proximal small bowel without definite  evidence of enteric obstruction.  2. The gallbladder and cystic duct are distended but otherwise  normal. This may be secondary to reported history of patient's  fasting state. Further evaluation may be obtained with a  gallbladder ultrasound as clinically indicated.  3. Interval development of bilateral cystic adnexal lesions which  is an atypical finding in this postmenopausal female. Further  evaluation with pelvic ultrasound may be obtained as clinically  indicated.  4. Colonic diverticulosis without evidence of diverticulitis.  5. Small bilateral effusions with possible airspace opacities  within the middle lobe, worrisome for infection. Clinical  correlation is advised.  6. Possible 1 cm nodule within the basilar aspect of the right  lower lobe. Further evaluation with dedicated chest CT may be  obtained as clinically indicated.  Labs:  CMP     Component Value Date/Time   NA 136 09/24/2011 0325   K 3.7 09/24/2011 0325   CL 100 09/24/2011 0325   CO2 31 09/24/2011 0325   GLUCOSE 101* 09/24/2011 0325   BUN 12 09/24/2011 0325   CREATININE 0.74 09/24/2011 0325   CALCIUM 8.6 09/24/2011 0325   PROT 7.6 09/23/2011 1550   ALBUMIN 3.4* 09/23/2011 1550   AST 10 09/23/2011 1550   ALT 11 09/23/2011 1550   ALKPHOS 129*  09/23/2011 1550   BILITOT 0.3 09/23/2011 1550   GFRNONAA 81* 09/24/2011 0325   GFRAA >90 09/24/2011 0325    Intake/Output Summary (Last 24 hours) at 09/24/11 1048 Last data filed at 09/24/11 0935  Gross per 24 hour  Intake    120 ml  Output    800 ml  Net   -680 ml   Last BM - 09/23/11  Diet Order: Dysphagia 3, nectar thick   Supplements/Tube Feeding:  IVF:    0.9 % NaCl with KCl 20 mEq / L Last Rate: 75 mL/hr at 09/24/11 0109    Estimated Nutritional Needs:   Kcal:1600-1850 Protein:70-90g Fluid:1.6-1.8L  NUTRITION DIAGNOSIS: -Inadequate oral intake (NI-2.1).  Status: Ongoing -Pt underweight for age and build AEB BMI of 20.5, pt appears cachetic   RELATED TO: on/off appetite, nausea/vomiting on admission  AS EVIDENCE BY: <50% meal intake   MONITORING/EVALUATION(Goals): Nutrition per pt and POA wishes.   EDUCATION NEEDS: -No education needs identified at this time  INTERVENTION: Chocolate Ensure Immune Health TID. PO intake as desired by pt. Diet per MD, if comfort feeds desired pt may enjoy thin liquids as she was not on thickened liquids at home, MD to discuss with POA.   Dietitian #: [phone]  DOCUMENTATION CODES Per approved criteria  -Underweight    Marshall Cork 09/24/2011, 10:39 AM

## 2011-09-24 NOTE — Progress Notes (Signed)
Pt had a small incontinent episode. Did I&O cath with UOP. Eugene Garnet [facility] 09/24/2011 6:24 AM

## 2011-09-24 NOTE — Progress Notes (Signed)
Pt is more confused than when originally assessed on the floor. She is disoriented to place, time and situation. Pt says she feels the urge to void but is unable to. She states that she forgets how to void. Bladder scan showed >765mL. Will notify on call provider and continue to monitor pt. Eugene Garnet [facility] 09/24/2011 4:12 AM

## 2011-09-24 NOTE — Progress Notes (Signed)
PCP: STONEKING,HAL THOMAS, MD, MD  Brief HPI:  This is a 77-year-old female with known history of Huntington's disease. Approximately 3 weeks ago she developed nausea and vomiting and dizziness. It has been intermittent. She was diagnosed with a UTI and treated with 10 days of Cipro. There was a question of aspiration with her emesis and her Huntington's disease. As stated her nausea and vomiting has been intermittent, however, last night it came back very severe. It is bilious in nature. Patient has no diarrhea. She does complain of stomach pain generalized. There is report of subjective fevers and chills. She was prescribed to meclizine, Zofran and Phenergan suppositories by her PCP. These are not controlling her symptoms. History provided by the patient and her power of attorney [name] Andrews [phone].  Past medical history:  Past Medical History   Diagnosis  Date   .  Huntington disease    .  Atrial fibrillation    .  Chronic respiratory failure      nocturnal oxygen   .  Complication of anesthesia      felt surgery with epidural    Past Surgical History   Procedure  Date   .  Colon surgery    .  Abdominal hysterectomy    .  Hemorrhoid surgery    .  Tonsillectomy    .  Appendectomy      Consultants: [facility]  Procedures: None so far  Subjective: Patient complaining of dizziness, more specifically she feels as if the room is spinning around. She does have a history of vertigo. Also with nausea. No diarrhea. She has abdominal pain as well.  Objective: Vital signs in last 24 hours: Temp:  [97.7 F (36.5 C)-99 F (37.2 C)] 98.4 F (36.9 C) (02/27 1353) Pulse Rate:  [63-104] 69  (02/27 1353) Resp:  [16-20] 18  (02/27 1353) BP: (118-140)/(61-81) 140/79 mmHg (02/27 1353) SpO2:  [91 %-99 %] 95 % (02/27 1353) Weight:  [57.607 kg (127 lb)] 57.607 kg (127 lb) (02/26 2325) Weight change:  Last BM Date: 09/23/11  Intake/Output from previous day: 02/26 0701 - 02/27 0700 In: -  Out:  800 [Urine:800] Intake/Output this shift: Total I/O In: 360 [P.O.:360] Out: -   General appearance: alert, cooperative and no distress Eyes: conjunctivae/corneas clear. PERRL, EOM's intact.  Resp: clear to auscultation bilaterally Cardio: regular rate and rhythm, S1, S2 normal, no murmur, click, rub or gallop GI: Soft, tender in RUQ with possible positive Murphy's sign. No masses or organomegaly. Extremities: extremities normal, atraumatic, no cyanosis or edema Pulses: 2+ and symmetric Skin: Skin color, texture, turgor normal. No rashes or lesions Lymph nodes: Cervical, supraclavicular, and axillary nodes normal. Neurologic: Grossly normal. Has huntington chorea.  Lab Results:  Basename 09/24/11 0325 09/23/11 1550  WBC 13.2* 21.0*  HGB 12.1 13.7  HCT 37.5 40.9  PLT 339 372   BMET  Basename 09/24/11 0325 09/23/11 1550  NA 136 138  K 3.7 3.9  CL 100 96  CO2 31 33*  GLUCOSE 101* 103*  BUN 12 15  CREATININE 0.74 0.67  CALCIUM 8.6 9.6    Studies/Results: Dg Chest 2 View  09/23/2011  *RADIOLOGY REPORT*  Clinical Data: Confusion and weakness.  CHEST - 2 VIEW  Comparison: None.  Findings: There is cardiomegaly.  Lung volumes are low with small bilateral pleural effusions basilar airspace disease, worse on the left.  No pneumothorax is identified.  IMPRESSION: Small bilateral pleural effusions and basilar airspace disease, worse on the left, could reflect   atelectasis or pneumonia.  Original Report Authenticated By: THOMAS L. D'ALESSIO, M.D.   Us Abdomen Complete  09/24/2011   *RADIOLOGY REPORT*  Clinical Data:  Nausea and vomiting.  COMPLETE ABDOMINAL ULTRASOUND  Comparison:  CT abdomen pelvis 09/23/2011 and ultrasound abdomen 12/23/2005.  Findings:  Gallbladder:  Shadowing echogenic stones measure up to 7 mm. Gallbladder wall measures 5 mm.  Edema is seen in the gallbladder fossa.  No sonographic Murphy's sign.  Common bile duct:  Measures 6 mm, within normal limits for age.   Liver:  Subtle intrahepatic biliary duct dilatation.  Parenchymal echogenicity is otherwise uniform.  IVC:  Appears normal.  Pancreas:  No focal abnormality seen.  Spleen:  Measures 6.7 cm, negative.  Right Kidney:  Measures 10.5 cm with uniform parenchymal echogenicity.  A 7 mm cyst is seen in the upper pole.  No hydronephrosis.  Left Kidney:  Measures 10.1 cm with uniform parenchymal echogenicity.  No hydronephrosis.  Abdominal aorta:  Distal abdominal aorta is obscured by bowel gas. There is atherosclerotic plaque.  No aneurysm in the visualized portions.  Additional finding:  Small right pleural effusion.  IMPRESSION:  1.  Cholelithiasis with edema in the gallbladder fossa and gallbladder wall thickening.  Although no sonographic Murphy's sign is present, acute cholecystitis is considered. 2.  Mild intrahepatic biliary duct dilatation. 3.  Small right pleural effusion.  Original Report Authenticated By: MELINDA A. BLIETZ, M.D.   Ct Abdomen Pelvis W Contrast  09/23/2011  *RADIOLOGY REPORT*  Clinical Data: Abdominal pain, nausea and vomiting, history of Huntington's disease, hysterectomy, appendectomy  CT ABDOMEN AND PELVIS WITH CONTRAST  Technique:  Multidetector CT imaging of the abdomen and pelvis was performed following the standard protocol during bolus administration of intravenous contrast.  Contrast:  75 ml Omnipaque-300  Comparison: CT abdomen pelvis - 12/07/2002; pelvic ultrasound - 06/01/2007.  Findings:  Normal hepatic contour.  7 mm hypoattenuating lesion within the right lobe of the liver (image 6, series 2) is too small to accurately characterize though favored to represent a hepatic cyst. The gallbladder and cystic duct are distended (the cystic duct measures approximately 1.3 mm in greatest coronal dimension - image 16, series five), however there is no gallbladder wall thickening or pericholecystic fluid.  No radiopaque gallstones.  The common bile duct is of normal caliber measuring 6 mm in  diameter (image 33, series 2). No ascites.  There is symmetric enhancement and excretion of the bilateral kidneys.  No urinary obstruction.  No renal lesions.  The bilateral adrenal glands, pancreas and spleen are normal.  Ingested enteric contrast extends to the level of the mid small bowel.  There is mild dilatation of the proximal small bowel with index loop within the left mid hemiabdomen measuring 2.6 mm in diameter (image 46, series 2) without definite evidence of enteric obstruction.  Moderate colonic stool burden.  Scattered colonic diverticulosis without evidence of diverticulitis.  No pneumoperitoneum, pneumatosis or portal venous gas.  Scattered atherosclerotic calcifications of a normal caliber abdominal aorta.  The major branch vessels of the abdominal aorta, including the IMA, are patent.  No retroperitoneal, mesenteric, pelvic or inguinal lymphadenopathy.  Interval development of bilateral cystic adnexal lesions with left adnexal lesion measuring approximately 4.1 x 2.4 cm (image 73, series 2) and right adnexal lesion measuring approximate 1.8 x 2.1 cm.  Post hysterectomy.  Surgical clips are again seen within the presacral space.  No free fluid in the pelvis.  Limited visualization of the lower thorax demonstrates small bilateral pleural   effusions and bibasilar opacities, possibly atelectasis.  Possible air space opacity within the medial segment of the right middle lobe (image six, series four) worrisome for infection.  There is a possible 1.0 x 0.6 cm nodule within the basilar aspect of the right lower lobe (image five), not definitely seen on prior remote examination.  Stable findings of pectus deformity of the visualized thorax.  No acute aggressive osseous abnormalities.  Degenerative change of the L5 - S1.  IMPRESSION: 1.  Mild dilatation of the proximal small bowel without definite evidence of enteric obstruction. 2.  The gallbladder and cystic duct are distended but otherwise normal.  This  may be secondary to reported history of patient's fasting state.  Further evaluation may be obtained with a gallbladder ultrasound as clinically indicated. 3.  Interval development of bilateral cystic adnexal lesions which is an atypical finding in this postmenopausal female.  Further evaluation with pelvic ultrasound may be obtained as clinically indicated.  4.  Colonic diverticulosis without evidence of diverticulitis.  5. Small bilateral effusions with possible airspace opacities within the middle lobe, worrisome for infection.  Clinical correlation is advised. 6.  Possible 1 cm nodule within the basilar aspect of the right lower lobe.  Further evaluation with dedicated chest CT may be obtained as clinically indicated.  Original Report Authenticated By: JOHN A. WATTS V, M.D.    Medications:  Scheduled:    . acetaminophen  650 mg Oral Once  . aspirin EC  81 mg Oral Daily  . clonazePAM  1 mg Oral QHS  . digoxin  250 mcg Oral Daily  . enoxaparin  40 mg Subcutaneous QHS  . estrogens (conjugated)  0.625 mg Oral Daily  . feeding supplement  237 mL Oral TID WC  . FLUoxetine  20 mg Oral Daily  . haloperidol  0.5 mg Oral BID  . levofloxacin (LEVAQUIN) IV  750 mg Intravenous Q24H  . levothyroxine  50 mcg Oral Daily  .  morphine injection  2 mg Intravenous Once  . ondansetron  4 mg Intravenous Once  . ondansetron  8 mg Oral Once  . pantoprazole  40 mg Oral Daily  . scopolamine  1 patch Transdermal Q72H  . senna-docusate  2 tablet Oral BID  . sodium chloride  500 mL Intravenous Once    Assessment/Plan:  Principal Problem:  *Cholecystitis with cholelithiasis Active Problems:  Aspiration pneumonia  Huntington's disease  Atrial fibrillation  Vertigo    1. Cholecystitis with Cholelithiasis: Will consult Gen surgery. NPO. Continue Levaquin for now. Check LFT's.  2. Dizzines: Likely due to Vertigo. Meclizine.  3. Atrial Fibrillation: rate controlled. Continue Digoxin. Not on  anticoagulation.  4. H/O Huntington's Disease: Under hospice care.  5. Aspiration Pneumonia: Stable  6. DNR  Discussed with Dr. Stoneking per patient request.    LOS: 1 day   Colin Ellers  [facility] Pager [phone] 09/24/2011, 4:41 PM    

## 2011-09-24 NOTE — Progress Notes (Signed)
[facility] Chaplain Visit: Met with pt and family (POA Fannie Knee and her sister Dois Davenport, along with Sandra's daughter April).  Pt in bed awake and pleasantly interactive with her usual short replies and comments, smiling and appearing comfortable.  Discussed how she was feeling miserable before, including irritability, from infection.  Pt and family told of possibility of having gall bladder surgery, and were glad to have prayer for wisdom about care decisions and for comfort.  Pt also agreed to have church updated about hospitalization. and call was made to assoc pastor Salvatore Marvel. Lovenia Shuck, [facility] Chaplain

## 2011-09-24 NOTE — Progress Notes (Signed)
UR completed 

## 2011-09-24 NOTE — Consult Note (Signed)
(Delayed entry- pt seen from 5:20-6:05pm)  Reason for Consult:Cholecystitis Referring Physician: Dr Clearnce Sorrel Sydney [name] is an 77 y.o. female.  HPI: 77 yo WF with Huntington disease who was admitted yesterday with nausea, vomiting, abdominal pain, leukocytosis, aspiration pneumonia, and failure to thrive was found to have radiological evidence of acute cholecystitis. Pt had a change in her chronic medical condition around three weeks ago when she started developing intermittent nausea, vomiting and vertigo. She was diagnosed with an UTI and given a course of antibiotics.  She has had ongoing intermittent episodes of vomiting. Her POA thinks she probably aspirated some of her emesis several times because she noticed that the pt would cough/choke/breath-in when she would vomit. Apparently several days ago she began having abdominal pain mainly around her umbilicus.  Her POA states that the pt generally doesn't complain about problems unless she is specifically asked questions.  The discomfort is constant. It is in her upper abdomen and right side now. She has had pain in the past - this is different. She generally has a hard time having stools. She is on stool softners and routinely takes a laxative. No melena, hematochezia, acholic stools, jaundice. +subjective fever and chills. Some NSAID use. Some gradual wt loss.   Past Medical History  Diagnosis Date  . Huntington disease   . Atrial fibrillation   . Chronic respiratory failure     nocturnal oxygen  . Complication of anesthesia     felt surgery with epidural    Past Surgical History  Procedure Date  . Colon surgery   . Abdominal hysterectomy   . Hemorrhoid surgery   . Tonsillectomy   . Appendectomy     Family History  Problem Relation Age of Onset  . Huntington's disease    . Diabetes type II    . Coronary artery disease      Social History:  reports that she quit smoking about 13 years ago. She has never used smokeless  tobacco. She reports that she does not drink alcohol or use illicit drugs.  Allergies:  Allergies  Allergen Reactions  . Codeine Itching  . Penicillins Cross Reactors Hives    Medications:reviewed  Results for orders placed during the hospital encounter of 09/23/11 (from the past 48 hour(s))  CBC     Status: Abnormal   Collection Time   09/23/11  3:50 PM      Component Value Range Comment   WBC 21.0 (*) 4.0 - 10.5 (K/uL)    RBC 4.46  3.87 - 5.11 (MIL/uL)    Hemoglobin 13.7  12.0 - 15.0 (g/dL)    HCT 40.9  81.1 - 91.4 (%)    MCV 91.7  78.0 - 100.0 (fL)    MCH 30.7  26.0 - 34.0 (pg)    MCHC 33.5  30.0 - 36.0 (g/dL)    RDW 78.2  95.6 - 21.3 (%)    Platelets 372  150 - 400 (K/uL)   DIFFERENTIAL     Status: Abnormal   Collection Time   09/23/11  3:50 PM      Component Value Range Comment   Neutrophils Relative 88 (*) 43 - 77 (%)    Neutro Abs 18.5 (*) 1.7 - 7.7 (K/uL)    Lymphocytes Relative 6 (*) 12 - 46 (%)    Lymphs Abs 1.2  0.7 - 4.0 (K/uL)    Monocytes Relative 6  3 - 12 (%)    Monocytes Absolute 1.2 (*) 0.1 - 1.0 (K/uL)  Eosinophils Relative 0  0 - 5 (%)    Eosinophils Absolute 0.0  0.0 - 0.7 (K/uL)    Basophils Relative 0  0 - 1 (%)    Basophils Absolute 0.0  0.0 - 0.1 (K/uL)   COMPREHENSIVE METABOLIC PANEL     Status: Abnormal   Collection Time   09/23/11  3:50 PM      Component Value Range Comment   Sodium 138  135 - 145 (mEq/L)    Potassium 3.9  3.5 - 5.1 (mEq/L)    Chloride 96  96 - 112 (mEq/L)    CO2 33 (*) 19 - 32 (mEq/L)    Glucose, Bld 103 (*) 70 - 99 (mg/dL)    BUN 15  6 - 23 (mg/dL)    Creatinine, Ser 1.61  0.50 - 1.10 (mg/dL)    Calcium 9.6  8.4 - 10.5 (mg/dL)    Total Protein 7.6  6.0 - 8.3 (g/dL)    Albumin 3.4 (*) 3.5 - 5.2 (g/dL)    AST 10  0 - 37 (U/L)    ALT 11  0 - 35 (U/L)    Alkaline Phosphatase 129 (*) 39 - 117 (U/L)    Total Bilirubin 0.3  0.3 - 1.2 (mg/dL)    GFR calc non Af Amer 84 (*) >90 (mL/min)    GFR calc Af Amer >90  >90  (mL/min)   LIPASE, BLOOD     Status: Normal   Collection Time   09/23/11  3:50 PM      Component Value Range Comment   Lipase 14  11 - 59 (U/L)   DIGOXIN LEVEL     Status: Normal   Collection Time   09/23/11  3:50 PM      Component Value Range Comment   Digoxin Level 1.4  0.8 - 2.0 (ng/mL)   TROPONIN I     Status: Normal   Collection Time   09/23/11  6:02 PM      Component Value Range Comment   Troponin I <0.30  <0.30 (ng/mL)   URINALYSIS, ROUTINE W REFLEX MICROSCOPIC     Status: Abnormal   Collection Time   09/23/11  7:11 PM      Component Value Range Comment   Color, Urine YELLOW  YELLOW     APPearance CLOUDY (*) CLEAR     Specific Gravity, Urine 1.025  1.005 - 1.030     pH 8.0  5.0 - 8.0     Glucose, UA NEGATIVE  NEGATIVE (mg/dL)    Hgb urine dipstick TRACE (*) NEGATIVE     Bilirubin Urine NEGATIVE  NEGATIVE     Ketones, ur NEGATIVE  NEGATIVE (mg/dL)    Protein, ur 30 (*) NEGATIVE (mg/dL)    Urobilinogen, UA 0.2  0.0 - 1.0 (mg/dL)    Nitrite NEGATIVE  NEGATIVE     Leukocytes, UA NEGATIVE  NEGATIVE    URINE MICROSCOPIC-ADD ON     Status: Abnormal   Collection Time   09/23/11  7:11 PM      Component Value Range Comment   Squamous Epithelial / LPF MANY (*) RARE     WBC, UA 0-2  <3 (WBC/hpf)    RBC / HPF 0-2  <3 (RBC/hpf)    Bacteria, UA RARE  RARE    BASIC METABOLIC PANEL     Status: Abnormal   Collection Time   09/24/11  3:25 AM      Component Value Range Comment   Sodium 136  135 - 145 (mEq/L)    Potassium 3.7  3.5 - 5.1 (mEq/L)    Chloride 100  96 - 112 (mEq/L)    CO2 31  19 - 32 (mEq/L)    Glucose, Bld 101 (*) 70 - 99 (mg/dL)    BUN 12  6 - 23 (mg/dL)    Creatinine, Ser 4.09  0.50 - 1.10 (mg/dL)    Calcium 8.6  8.4 - 10.5 (mg/dL)    GFR calc non Af Amer 81 (*) >90 (mL/min)    GFR calc Af Amer >90  >90 (mL/min)   CBC     Status: Abnormal   Collection Time   09/24/11  3:25 AM      Component Value Range Comment   WBC 13.2 (*) 4.0 - 10.5 (K/uL)    RBC 4.09  3.87 -  5.11 (MIL/uL)    Hemoglobin 12.1  12.0 - 15.0 (g/dL)    HCT 81.1  91.4 - 78.2 (%)    MCV 91.7  78.0 - 100.0 (fL)    MCH 29.6  26.0 - 34.0 (pg)    MCHC 32.3  30.0 - 36.0 (g/dL)    RDW 95.6  21.3 - 08.6 (%)    Platelets 339  150 - 400 (K/uL)   HEPATIC FUNCTION PANEL     Status: Abnormal   Collection Time   09/24/11  5:18 PM      Component Value Range Comment   Total Protein 6.5  6.0 - 8.3 (g/dL)    Albumin 2.8 (*) 3.5 - 5.2 (g/dL)    AST 8  0 - 37 (U/L)    ALT 9  0 - 35 (U/L)    Alkaline Phosphatase 117  39 - 117 (U/L)    Total Bilirubin 0.2 (*) 0.3 - 1.2 (mg/dL)    Bilirubin, Direct <5.7  0.0 - 0.3 (mg/dL)    Indirect Bilirubin NOT CALCULATED  0.3 - 0.9 (mg/dL)   PROTIME-INR     Status: Normal   Collection Time   09/24/11  5:18 PM      Component Value Range Comment   Prothrombin Time 13.1  11.6 - 15.2 (seconds)    INR 0.97  0.00 - 1.49    APTT     Status: Normal   Collection Time   09/24/11  5:18 PM      Component Value Range Comment   aPTT 37  24 - 37 (seconds)     Dg Chest 2 View  09/23/2011  *RADIOLOGY REPORT*  Clinical Data: Confusion and weakness.  CHEST - 2 VIEW  Comparison: None.  Findings: There is cardiomegaly.  Lung volumes are low with small bilateral pleural effusions basilar airspace disease, worse on the left.  No pneumothorax is identified.  IMPRESSION: Small bilateral pleural effusions and basilar airspace disease, worse on the left, could reflect atelectasis or pneumonia.  Original Report Authenticated By: Bernadene Bell. Maricela Curet, M.D.   US Abdomen Complete  09/24/2011   *RADIOLOGY REPORT*  Clinical Data:  Nausea and vomiting.  COMPLETE ABDOMINAL ULTRASOUND  Comparison:  CT abdomen pelvis 09/23/2011 and ultrasound abdomen 12/23/2005.  Findings:  Gallbladder:  Shadowing echogenic stones measure up to 7 mm. Gallbladder wall measures 5 mm.  Edema is seen in the gallbladder fossa.  No sonographic Murphy's sign.  Common bile duct:  Measures 6 mm, within normal limits for age.   Liver:  Subtle intrahepatic biliary duct dilatation.  Parenchymal echogenicity is otherwise uniform.  IVC:  Appears normal.  Pancreas:  No  focal abnormality seen.  Spleen:  Measures 6.7 cm, negative.  Right Kidney:  Measures 10.5 cm with uniform parenchymal echogenicity.  A 7 mm cyst is seen in the upper pole.  No hydronephrosis.  Left Kidney:  Measures 10.1 cm with uniform parenchymal echogenicity.  No hydronephrosis.  Abdominal aorta:  Distal abdominal aorta is obscured by bowel gas. There is atherosclerotic plaque.  No aneurysm in the visualized portions.  Additional finding:  Small right pleural effusion.  IMPRESSION:  1.  Cholelithiasis with edema in the gallbladder fossa and gallbladder wall thickening.  Although no sonographic Murphy's sign is present, acute cholecystitis is considered. 2.  Mild intrahepatic biliary duct dilatation. 3.  Small right pleural effusion.  Original Report Authenticated By: Reyes Ivan, M.D.   Ct Abdomen Pelvis W Contrast  09/23/2011  *RADIOLOGY REPORT*  Clinical Data: Abdominal pain, nausea and vomiting, history of Huntington's disease, hysterectomy, appendectomy  CT ABDOMEN AND PELVIS WITH CONTRAST  Technique:  Multidetector CT imaging of the abdomen and pelvis was performed following the standard protocol during bolus administration of intravenous contrast.  Contrast:  75 ml Omnipaque-300  Comparison: CT abdomen pelvis - 12/07/2002; pelvic ultrasound - 06/01/2007.  Findings:  Normal hepatic contour.  7 mm hypoattenuating lesion within the right lobe of the liver (image 6, series 2) is too small to accurately characterize though favored to represent a hepatic cyst. The gallbladder and cystic duct are distended (the cystic duct measures approximately 1.3 mm in greatest coronal dimension - image 16, series five), however there is no gallbladder wall thickening or pericholecystic fluid.  No radiopaque gallstones.  The common bile duct is of normal caliber measuring 6 mm in  diameter (image 33, series 2). No ascites.  There is symmetric enhancement and excretion of the bilateral kidneys.  No urinary obstruction.  No renal lesions.  The bilateral adrenal glands, pancreas and spleen are normal.  Ingested enteric contrast extends to the level of the mid small bowel.  There is mild dilatation of the proximal small bowel with index loop within the left mid hemiabdomen measuring 2.6 mm in diameter (image 46, series 2) without definite evidence of enteric obstruction.  Moderate colonic stool burden.  Scattered colonic diverticulosis without evidence of diverticulitis.  No pneumoperitoneum, pneumatosis or portal venous gas.  Scattered atherosclerotic calcifications of a normal caliber abdominal aorta.  The major branch vessels of the abdominal aorta, including the IMA, are patent.  No retroperitoneal, mesenteric, pelvic or inguinal lymphadenopathy.  Interval development of bilateral cystic adnexal lesions with left adnexal lesion measuring approximately 4.1 x 2.4 cm (image 73, series 2) and right adnexal lesion measuring approximate 1.8 x 2.1 cm.  Post hysterectomy.  Surgical clips are again seen within the presacral space.  No free fluid in the pelvis.  Limited visualization of the lower thorax demonstrates small bilateral pleural effusions and bibasilar opacities, possibly atelectasis.  Possible air space opacity within the medial segment of the right middle lobe (image six, series four) worrisome for infection.  There is a possible 1.0 x 0.6 cm nodule within the basilar aspect of the right lower lobe (image five), not definitely seen on prior remote examination.  Stable findings of pectus deformity of the visualized thorax.  No acute aggressive osseous abnormalities.  Degenerative change of the L5 - S1.  IMPRESSION: 1.  Mild dilatation of the proximal small bowel without definite evidence of enteric obstruction. 2.  The gallbladder and cystic duct are distended but otherwise normal.  This  may be  secondary to reported history of patient's fasting state.  Further evaluation may be obtained with a gallbladder ultrasound as clinically indicated. 3.  Interval development of bilateral cystic adnexal lesions which is an atypical finding in this postmenopausal female.  Further evaluation with pelvic ultrasound may be obtained as clinically indicated.  4.  Colonic diverticulosis without evidence of diverticulitis.  5. Small bilateral effusions with possible airspace opacities within the middle lobe, worrisome for infection.  Clinical correlation is advised. 6.  Possible 1 cm nodule within the basilar aspect of the right lower lobe.  Further evaluation with dedicated chest CT may be obtained as clinically indicated.  Original Report Authenticated By: Waynard Reeds, M.D.    Review of Systems  Constitutional: Positive for weight loss.  HENT: Negative for hearing loss.        Some voice changes.   Eyes: Negative for blurred vision and double vision.       Some vision changes  Respiratory: Positive for cough. Negative for wheezing.        On O2 at night  Cardiovascular: Negative for chest pain and palpitations.       Gets short of breath if lays flat; +DOE  Gastrointestinal: Positive for nausea, vomiting, abdominal pain and constipation (on chronic stool softners, will need to take miralax frequently). Negative for diarrhea, blood in stool and melena.  Genitourinary: Negative for dysuria.       Has some urinary infrequency-no incontinence  Musculoskeletal:       Huntington disease; very limited mobility, primarily in wheelchair.   Skin: Negative for itching and rash.  Neurological: Positive for weakness. Negative for seizures, loss of consciousness and headaches.       Has some speech difficulty. Questionable aspiration over the past several weeks with the multitude episodes of emesis  Psychiatric/Behavioral: Positive for depression.   Blood pressure 140/79, pulse 69, temperature 98.4 F  (36.9 C), temperature source Oral, resp. rate 18, height 5\' 6"  (1.676 m), weight 127 lb (57.607 kg), SpO2 95.00%. Physical Exam  Vitals reviewed. Constitutional: She appears cachectic. She is cooperative. No distress.  HENT:  Head: Normocephalic and atraumatic.  Right Ear: External ear normal.  Left Ear: External ear normal.  Eyes: Conjunctivae are normal. No scleral icterus.  Neck: No JVD present. No tracheal deviation present.  Cardiovascular: Normal rate, regular rhythm and normal heart sounds.   Respiratory: Effort normal and breath sounds normal. No respiratory distress. She has no wheezes.  GI: Soft. Bowel sounds are normal. She exhibits no distension. There is tenderness in the right upper quadrant and epigastric area. There is no rigidity, no rebound and no guarding. No hernia.    Musculoskeletal: She exhibits no edema and no tenderness.       Has some contracture in rt hand and left foot; moves all extremities  Neurological: She is alert. She exhibits abnormal muscle tone.       +chorea; grossly moves all extremities.   Skin: Skin is warm and dry. She is not diaphoretic. No erythema.  Psychiatric: Her behavior is normal. Thought content normal.    Assessment/Plan: Patient Active Problem List  Diagnoses  . Aspiration pneumonia  . Huntington's disease  . Atrial fibrillation  . Cholecystitis with cholelithiasis  . Vertigo  B/l adnexal cysts   Exam and radiological findings are consistent with acute cholecystitis. It is unclear exactly how long the patient's symptoms have been ongoing. This is a difficult situation given the patient's underlying Huntington's disease and chronic respiratory  issues which pose an increased risk for general anesthesia (prolonged vent dependence).   We discussed gallbladder disease. We discussed non-operative (such as the use of a percutaneous cholecystostomy tube) and operative management.   I discussed laparoscopic cholecystectomy and open  cholecystectomy in detail.  The patient was given a diagram detailing the procedure.  We discussed the risks and benefits of a laparoscopic cholecystectomy including, but not limited to bleeding, infection, injury to surrounding structures such as the intestine or liver, bile leak, retained gallstones, need to convert to an open procedure, prolonged diarrhea, blood clots such as  DVT, common bile duct injury, anesthesia risks, and possible need for additional procedures.    Given the unknown duration of her symptoms I think there is a significant chance of having to convert to an open procedure. She is also at increased risk for bile duct injury, bile leak due to the infection, and anesthesia risks. With her underlying Huntington's disease it would be ideal to avoid a paralytic agent during surgery which could make it difficult to achieve adequate pneumoperitoneum.  Her anesthesia risk and overall risk is also increased by her current aspiration pneumonia.  Therefore, I recommend placement of a percutaneous cholecystostomy tube by IR. The drain would stay in long term. (these drains typically stay in longer than 12 weeks in order to let the gallbladder and surrounding inflammation decrease).  If the patient could be 'optimized' (resolution of pneumonia, adequate nutritional status, etc) during this time then it is possible she would be a candidate for cholecystectomy.    i will order percutaneous drain of gallbladder in the morning. Npo after midnight.  Mary Sella. Andrey Campanile, MD, FACS General, Bariatric, & Minimally Invasive Surgery [facility], [place]   [facility] M 09/24/2011, 10:41 PM

## 2011-09-25 ENCOUNTER — Inpatient Hospital Stay (HOSPITAL_COMMUNITY)

## 2011-09-25 LAB — COMPREHENSIVE METABOLIC PANEL
AST: 7 U/L (ref 0–37)
Albumin: 2.5 g/dL — ABNORMAL LOW (ref 3.5–5.2)
Alkaline Phosphatase: 99 U/L (ref 39–117)
Chloride: 103 mEq/L (ref 96–112)
Potassium: 3.8 mEq/L (ref 3.5–5.1)
Total Bilirubin: 0.3 mg/dL (ref 0.3–1.2)
Total Protein: 6 g/dL (ref 6.0–8.3)

## 2011-09-25 LAB — CBC
Platelets: 330 10*3/uL (ref 150–400)
RDW: 13.9 % (ref 11.5–15.5)
WBC: 9 10*3/uL (ref 4.0–10.5)

## 2011-09-25 MED ORDER — HYDROMORPHONE HCL PF 1 MG/ML IJ SOLN
1.0000 mg | INTRAMUSCULAR | Status: DC | PRN
  Administered 2011-09-25 – 2011-09-27 (×3): 1 mg via INTRAVENOUS
  Filled 2011-09-25 (×4): qty 1

## 2011-09-25 MED ORDER — MEPERIDINE HCL 25 MG/ML IJ SOLN
INTRAMUSCULAR | Status: AC | PRN
  Administered 2011-09-25: 25 mg via INTRAVENOUS

## 2011-09-25 MED ORDER — MIDAZOLAM HCL 5 MG/5ML IJ SOLN
INTRAMUSCULAR | Status: AC | PRN
  Administered 2011-09-25: 1 mg via INTRAVENOUS

## 2011-09-25 MED ORDER — CIPROFLOXACIN IN D5W 400 MG/200ML IV SOLN
400.0000 mg | Freq: Once | INTRAVENOUS | Status: AC
  Administered 2011-09-25: 400 mg via INTRAVENOUS

## 2011-09-25 MED ORDER — ENOXAPARIN SODIUM 40 MG/0.4ML ~~LOC~~ SOLN
40.0000 mg | Freq: Every day | SUBCUTANEOUS | Status: DC
  Administered 2011-09-25 – 2011-10-01 (×7): 40 mg via SUBCUTANEOUS
  Filled 2011-09-25 (×9): qty 0.4

## 2011-09-25 MED ORDER — FENTANYL CITRATE 0.05 MG/ML IJ SOLN
INTRAMUSCULAR | Status: AC | PRN
  Administered 2011-09-25: 50 ug via INTRAVENOUS

## 2011-09-25 NOTE — H&P (View-Only) (Signed)
PCP: Ginette Otto, MD, MD  Brief HPI:  This is a 77 year old female with known history of Huntington's disease. Approximately 3 weeks ago she developed nausea and vomiting and dizziness. It has been intermittent. She was diagnosed with a UTI and treated with 10 days of Cipro. There was a question of aspiration with her emesis and her Huntington's disease. As stated her nausea and vomiting has been intermittent, however, last night it came back very severe. It is bilious in nature. Patient has no diarrhea. She does complain of stomach pain generalized. There is report of subjective fevers and chills. She was prescribed to meclizine, Zofran and Phenergan suppositories by her PCP. These are not controlling her symptoms. History provided by the patient and her power of attorney Eugenio Hoes [phone].  Past medical history:  Past Medical History   Diagnosis  Date   .  Huntington disease    .  Atrial fibrillation    .  Chronic respiratory failure      nocturnal oxygen   .  Complication of anesthesia      felt surgery with epidural    Past Surgical History   Procedure  Date   .  Colon surgery    .  Abdominal hysterectomy    .  Hemorrhoid surgery    .  Tonsillectomy    .  Appendectomy      Consultants: [facility]  Procedures: None so far  Subjective: Patient complaining of dizziness, more specifically she feels as if the room is spinning around. She does have a history of vertigo. Also with nausea. No diarrhea. She has abdominal pain as well.  Objective: Vital signs in last 24 hours: Temp:  [97.7 F (36.5 C)-99 F (37.2 C)] 98.4 F (36.9 C) (02/27 1353) Pulse Rate:  [63-104] 69  (02/27 1353) Resp:  [16-20] 18  (02/27 1353) BP: (118-140)/(61-81) 140/79 mmHg (02/27 1353) SpO2:  [91 %-99 %] 95 % (02/27 1353) Weight:  [57.607 kg (127 lb)] 57.607 kg (127 lb) (02/26 2325) Weight change:  Last BM Date: 09/23/11  Intake/Output from previous day: 02/26 0701 - 02/27 0700 In: -  Out:  800 [Urine:800] Intake/Output this shift: Total I/O In: 360 [P.O.:360] Out: -   General appearance: alert, cooperative and no distress Eyes: conjunctivae/corneas clear. PERRL, EOM's intact.  Resp: clear to auscultation bilaterally Cardio: regular rate and rhythm, S1, S2 normal, no murmur, click, rub or gallop GI: Soft, tender in RUQ with possible positive Murphy's sign. No masses or organomegaly. Extremities: extremities normal, atraumatic, no cyanosis or edema Pulses: 2+ and symmetric Skin: Skin color, texture, turgor normal. No rashes or lesions Lymph nodes: Cervical, supraclavicular, and axillary nodes normal. Neurologic: Grossly normal. Has huntington chorea.  Lab Results:  Basename 09/24/11 0325 09/23/11 1550  WBC 13.2* 21.0*  HGB 12.1 13.7  HCT 37.5 40.9  PLT 339 372   BMET  Basename 09/24/11 0325 09/23/11 1550  NA 136 138  K 3.7 3.9  CL 100 96  CO2 31 33*  GLUCOSE 101* 103*  BUN 12 15  CREATININE 0.74 0.67  CALCIUM 8.6 9.6    Studies/Results: Dg Chest 2 View  09/23/2011  *RADIOLOGY REPORT*  Clinical Data: Confusion and weakness.  CHEST - 2 VIEW  Comparison: None.  Findings: There is cardiomegaly.  Lung volumes are low with small bilateral pleural effusions basilar airspace disease, worse on the left.  No pneumothorax is identified.  IMPRESSION: Small bilateral pleural effusions and basilar airspace disease, worse on the left, could reflect  atelectasis or pneumonia.  Original Report Authenticated By: Bernadene Bell. Maricela Curet, M.D.   US Abdomen Complete  09/24/2011   *RADIOLOGY REPORT*  Clinical Data:  Nausea and vomiting.  COMPLETE ABDOMINAL ULTRASOUND  Comparison:  CT abdomen pelvis 09/23/2011 and ultrasound abdomen 12/23/2005.  Findings:  Gallbladder:  Shadowing echogenic stones measure up to 7 mm. Gallbladder wall measures 5 mm.  Edema is seen in the gallbladder fossa.  No sonographic Murphy's sign.  Common bile duct:  Measures 6 mm, within normal limits for age.   Liver:  Subtle intrahepatic biliary duct dilatation.  Parenchymal echogenicity is otherwise uniform.  IVC:  Appears normal.  Pancreas:  No focal abnormality seen.  Spleen:  Measures 6.7 cm, negative.  Right Kidney:  Measures 10.5 cm with uniform parenchymal echogenicity.  A 7 mm cyst is seen in the upper pole.  No hydronephrosis.  Left Kidney:  Measures 10.1 cm with uniform parenchymal echogenicity.  No hydronephrosis.  Abdominal aorta:  Distal abdominal aorta is obscured by bowel gas. There is atherosclerotic plaque.  No aneurysm in the visualized portions.  Additional finding:  Small right pleural effusion.  IMPRESSION:  1.  Cholelithiasis with edema in the gallbladder fossa and gallbladder wall thickening.  Although no sonographic Murphy's sign is present, acute cholecystitis is considered. 2.  Mild intrahepatic biliary duct dilatation. 3.  Small right pleural effusion.  Original Report Authenticated By: Reyes Ivan, M.D.   Ct Abdomen Pelvis W Contrast  09/23/2011  *RADIOLOGY REPORT*  Clinical Data: Abdominal pain, nausea and vomiting, history of Huntington's disease, hysterectomy, appendectomy  CT ABDOMEN AND PELVIS WITH CONTRAST  Technique:  Multidetector CT imaging of the abdomen and pelvis was performed following the standard protocol during bolus administration of intravenous contrast.  Contrast:  75 ml Omnipaque-300  Comparison: CT abdomen pelvis - 12/07/2002; pelvic ultrasound - 06/01/2007.  Findings:  Normal hepatic contour.  7 mm hypoattenuating lesion within the right lobe of the liver (image 6, series 2) is too small to accurately characterize though favored to represent a hepatic cyst. The gallbladder and cystic duct are distended (the cystic duct measures approximately 1.3 mm in greatest coronal dimension - image 16, series five), however there is no gallbladder wall thickening or pericholecystic fluid.  No radiopaque gallstones.  The common bile duct is of normal caliber measuring 6 mm in  diameter (image 33, series 2). No ascites.  There is symmetric enhancement and excretion of the bilateral kidneys.  No urinary obstruction.  No renal lesions.  The bilateral adrenal glands, pancreas and spleen are normal.  Ingested enteric contrast extends to the level of the mid small bowel.  There is mild dilatation of the proximal small bowel with index loop within the left mid hemiabdomen measuring 2.6 mm in diameter (image 46, series 2) without definite evidence of enteric obstruction.  Moderate colonic stool burden.  Scattered colonic diverticulosis without evidence of diverticulitis.  No pneumoperitoneum, pneumatosis or portal venous gas.  Scattered atherosclerotic calcifications of a normal caliber abdominal aorta.  The major branch vessels of the abdominal aorta, including the IMA, are patent.  No retroperitoneal, mesenteric, pelvic or inguinal lymphadenopathy.  Interval development of bilateral cystic adnexal lesions with left adnexal lesion measuring approximately 4.1 x 2.4 cm (image 73, series 2) and right adnexal lesion measuring approximate 1.8 x 2.1 cm.  Post hysterectomy.  Surgical clips are again seen within the presacral space.  No free fluid in the pelvis.  Limited visualization of the lower thorax demonstrates small bilateral pleural  effusions and bibasilar opacities, possibly atelectasis.  Possible air space opacity within the medial segment of the right middle lobe (image six, series four) worrisome for infection.  There is a possible 1.0 x 0.6 cm nodule within the basilar aspect of the right lower lobe (image five), not definitely seen on prior remote examination.  Stable findings of pectus deformity of the visualized thorax.  No acute aggressive osseous abnormalities.  Degenerative change of the L5 - S1.  IMPRESSION: 1.  Mild dilatation of the proximal small bowel without definite evidence of enteric obstruction. 2.  The gallbladder and cystic duct are distended but otherwise normal.  This  may be secondary to reported history of patient's fasting state.  Further evaluation may be obtained with a gallbladder ultrasound as clinically indicated. 3.  Interval development of bilateral cystic adnexal lesions which is an atypical finding in this postmenopausal female.  Further evaluation with pelvic ultrasound may be obtained as clinically indicated.  4.  Colonic diverticulosis without evidence of diverticulitis.  5. Small bilateral effusions with possible airspace opacities within the middle lobe, worrisome for infection.  Clinical correlation is advised. 6.  Possible 1 cm nodule within the basilar aspect of the right lower lobe.  Further evaluation with dedicated chest CT may be obtained as clinically indicated.  Original Report Authenticated By: Waynard Reeds, M.D.    Medications:  Scheduled:    . acetaminophen  650 mg Oral Once  . aspirin EC  81 mg Oral Daily  . clonazePAM  1 mg Oral QHS  . digoxin  250 mcg Oral Daily  . enoxaparin  40 mg Subcutaneous QHS  . estrogens (conjugated)  0.625 mg Oral Daily  . feeding supplement  237 mL Oral TID WC  . FLUoxetine  20 mg Oral Daily  . haloperidol  0.5 mg Oral BID  . levofloxacin (LEVAQUIN) IV  750 mg Intravenous Q24H  . levothyroxine  50 mcg Oral Daily  .  morphine injection  2 mg Intravenous Once  . ondansetron  4 mg Intravenous Once  . ondansetron  8 mg Oral Once  . pantoprazole  40 mg Oral Daily  . scopolamine  1 patch Transdermal Q72H  . senna-docusate  2 tablet Oral BID  . sodium chloride  500 mL Intravenous Once    Assessment/Plan:  Principal Problem:  *Cholecystitis with cholelithiasis Active Problems:  Aspiration pneumonia  Huntington's disease  Atrial fibrillation  Vertigo    1. Cholecystitis with Cholelithiasis: Will consult Gen surgery. NPO. Continue Levaquin for now. Check LFT's.  2. Dizzines: Likely due to Vertigo. Meclizine.  3. Atrial Fibrillation: rate controlled. Continue Digoxin. Not on  anticoagulation.  4. H/O Huntington's Disease: Under hospice care.  5. Aspiration Pneumonia: Stable  6. DNR  Discussed with Dr. Pete Glatter per patient request.    LOS: 1 day   [facility]  [facility] Pager [phone] 09/24/2011, 4:41 PM

## 2011-09-25 NOTE — Procedures (Signed)
Successful perc cholecystostomy No comp Stable Full report to follow in PACS

## 2011-09-25 NOTE — Progress Notes (Signed)
[facility] MSW note: MSW met with caregiver/POA-[name]. Pt slept during visit. Pt had cholecystectomy drain placed today. Pt appeared comfortable. No grimace on face. MSW explored with caregiver care options and issues. MSW educated POA on snf or hospice home (if medically appropriate). POA unclear at this time what she will do at time of hospital discharge. POA aware of continued hospice services at home if pt goes home. POA's main goal is for pt's comfort and no suffering. MSW provided active listening as POA shared her grief/loss issues.  Elijio Miles, MSW

## 2011-09-25 NOTE — Progress Notes (Signed)
Patient ID: Sydney [name], female   DOB: 04-14-1935, 77 y.o.   MRN: [id number]    Subjective: Pt is getting her bed changed right now.  She is very aggitated.  Denies abdominal pain, but then says she hurts everywhere.  Objective: Vital signs in last 24 hours: Temp:  [98.4 F (36.9 C)-99.5 F (37.5 C)] 98.9 F (37.2 C) (02/28 0623) Pulse Rate:  [68-75] 68  (02/28 0623) Resp:  [16-18] 16  (02/28 0623) BP: (140-145)/(78-79) 145/78 mmHg (02/28 0623) SpO2:  [92 %-95 %] 92 % (02/28 0623) Last BM Date: 09/23/11  Intake/Output from previous day: 02/27 0701 - 02/28 0700 In: 960 [P.O.:360; I.V.:600] Out: 1525 [Urine:1525] Intake/Output this shift:    PE: Abd: soft, mild RUQ tenderness, +BS, ND  Lab Results:   [facility] 09/25/11 0347 09/24/11 0325  WBC 9.0 13.2*  HGB 12.2 12.1  HCT 38.1 37.5  PLT 330 339   BMET  Basename 09/25/11 0347 09/24/11 0325  NA 136 136  K 3.8 3.7  CL 103 100  CO2 27 31  GLUCOSE 94 101*  BUN 7 12  CREATININE 0.71 0.74  CALCIUM 8.3* 8.6   PT/INR  Basename 09/24/11 1718  LABPROT 13.1  INR 0.97     Studies/Results: Dg Chest 2 View  09/23/2011  *RADIOLOGY REPORT*  Clinical Data: Confusion and weakness.  CHEST - 2 VIEW  Comparison: None.  Findings: There is cardiomegaly.  Lung volumes are low with small bilateral pleural effusions basilar airspace disease, worse on the left.  No pneumothorax is identified.  IMPRESSION: Small bilateral pleural effusions and basilar airspace disease, worse on the left, could reflect atelectasis or pneumonia.  Original Report Authenticated By: Bernadene Bell. Maricela Curet, M.D.   US Abdomen Complete  09/24/2011   *RADIOLOGY REPORT*  Clinical Data:  Nausea and vomiting.  COMPLETE ABDOMINAL ULTRASOUND  Comparison:  CT abdomen pelvis 09/23/2011 and ultrasound abdomen 12/23/2005.  Findings:  Gallbladder:  Shadowing echogenic stones measure up to 7 mm. Gallbladder wall measures 5 mm.  Edema is seen in the gallbladder fossa.  No  sonographic Murphy's sign.  Common bile duct:  Measures 6 mm, within normal limits for age.  Liver:  Subtle intrahepatic biliary duct dilatation.  Parenchymal echogenicity is otherwise uniform.  IVC:  Appears normal.  Pancreas:  No focal abnormality seen.  Spleen:  Measures 6.7 cm, negative.  Right Kidney:  Measures 10.5 cm with uniform parenchymal echogenicity.  A 7 mm cyst is seen in the upper pole.  No hydronephrosis.  Left Kidney:  Measures 10.1 cm with uniform parenchymal echogenicity.  No hydronephrosis.  Abdominal aorta:  Distal abdominal aorta is obscured by bowel gas. There is atherosclerotic plaque.  No aneurysm in the visualized portions.  Additional finding:  Small right pleural effusion.  IMPRESSION:  1.  Cholelithiasis with edema in the gallbladder fossa and gallbladder wall thickening.  Although no sonographic Murphy's sign is present, acute cholecystitis is considered. 2.  Mild intrahepatic biliary duct dilatation. 3.  Small right pleural effusion.  Original Report Authenticated By: Reyes Ivan, M.D.   Ct Abdomen Pelvis W Contrast  09/23/2011  *RADIOLOGY REPORT*  Clinical Data: Abdominal pain, nausea and vomiting, history of Huntington's disease, hysterectomy, appendectomy  CT ABDOMEN AND PELVIS WITH CONTRAST  Technique:  Multidetector CT imaging of the abdomen and pelvis was performed following the standard protocol during bolus administration of intravenous contrast.  Contrast:  75 ml Omnipaque-300  Comparison: CT abdomen pelvis - 12/07/2002; pelvic ultrasound - 06/01/2007.  Findings:  Normal hepatic contour.  7 mm hypoattenuating lesion within the right lobe of the liver (image 6, series 2) is too small to accurately characterize though favored to represent a hepatic cyst. The gallbladder and cystic duct are distended (the cystic duct measures approximately 1.3 mm in greatest coronal dimension - image 16, series five), however there is no gallbladder wall thickening or pericholecystic  fluid.  No radiopaque gallstones.  The common bile duct is of normal caliber measuring 6 mm in diameter (image 33, series 2). No ascites.  There is symmetric enhancement and excretion of the bilateral kidneys.  No urinary obstruction.  No renal lesions.  The bilateral adrenal glands, pancreas and spleen are normal.  Ingested enteric contrast extends to the level of the mid small bowel.  There is mild dilatation of the proximal small bowel with index loop within the left mid hemiabdomen measuring 2.6 mm in diameter (image 46, series 2) without definite evidence of enteric obstruction.  Moderate colonic stool burden.  Scattered colonic diverticulosis without evidence of diverticulitis.  No pneumoperitoneum, pneumatosis or portal venous gas.  Scattered atherosclerotic calcifications of a normal caliber abdominal aorta.  The major branch vessels of the abdominal aorta, including the IMA, are patent.  No retroperitoneal, mesenteric, pelvic or inguinal lymphadenopathy.  Interval development of bilateral cystic adnexal lesions with left adnexal lesion measuring approximately 4.1 x 2.4 cm (image 73, series 2) and right adnexal lesion measuring approximate 1.8 x 2.1 cm.  Post hysterectomy.  Surgical clips are again seen within the presacral space.  No free fluid in the pelvis.  Limited visualization of the lower thorax demonstrates small bilateral pleural effusions and bibasilar opacities, possibly atelectasis.  Possible air space opacity within the medial segment of the right middle lobe (image six, series four) worrisome for infection.  There is a possible 1.0 x 0.6 cm nodule within the basilar aspect of the right lower lobe (image five), not definitely seen on prior remote examination.  Stable findings of pectus deformity of the visualized thorax.  No acute aggressive osseous abnormalities.  Degenerative change of the L5 - S1.  IMPRESSION: 1.  Mild dilatation of the proximal small bowel without definite evidence of  enteric obstruction. 2.  The gallbladder and cystic duct are distended but otherwise normal.  This may be secondary to reported history of patient's fasting state.  Further evaluation may be obtained with a gallbladder ultrasound as clinically indicated. 3.  Interval development of bilateral cystic adnexal lesions which is an atypical finding in this postmenopausal female.  Further evaluation with pelvic ultrasound may be obtained as clinically indicated.  4.  Colonic diverticulosis without evidence of diverticulitis.  5. Small bilateral effusions with possible airspace opacities within the middle lobe, worrisome for infection.  Clinical correlation is advised. 6.  Possible 1 cm nodule within the basilar aspect of the right lower lobe.  Further evaluation with dedicated chest CT may be obtained as clinically indicated.  Original Report Authenticated By: Waynard Reeds, M.D.    Anti-infectives: Anti-infectives     Start     Dose/Rate Route Frequency Ordered Stop   09/23/11 2030   Levofloxacin (LEVAQUIN) IVPB 750 mg        750 mg 100 mL/hr over 90 Minutes Intravenous Every 24 hours 09/23/11 2018 10/01/11 2029           Assessment/Plan  1. Acute cholecystitis, not a surgical candidate at this time  Plan: 1. Will have IR place a percutaneous cholecystostomy drain. 2. Continue  NPO until seen by IR for procedure 3. Hold DVT prophylaxis.   LOS: 2 days    Marabella Popiel E 09/25/2011

## 2011-09-25 NOTE — Progress Notes (Signed)
Patient back to rm, from IR, asleep,cholecystectomy drain intact, comfortable at this time

## 2011-09-25 NOTE — Progress Notes (Signed)
Pt asleep. Exam deferred.   Cont with plans for IR cholecystostomy tube placement  Cont IV abx for cholecystitis.  Sydney [name]. Andrey Campanile, MD, FACS General, Bariatric, & Minimally Invasive Surgery [facility], [place]

## 2011-09-25 NOTE — Progress Notes (Signed)
PCP: Ginette Otto, MD, MD  Brief HPI:  This is a 77 year old female with known history of Huntington's disease. Approximately 3 weeks ago she developed nausea and vomiting and dizziness. It has been intermittent. She was diagnosed with a UTI and treated with 10 days of Cipro. There was a question of aspiration with her emesis and her Huntington's disease. As stated her nausea and vomiting has been intermittent, however, last night it came back very severe. It is bilious in nature. Patient has no diarrhea. She does complain of stomach pain generalized. There is report of subjective fevers and chills. She was prescribed to meclizine, Zofran and Phenergan suppositories by her PCP. These are not controlling her symptoms. History provided by the patient and her power of attorney Eugenio Hoes [phone].  Past medical history:  Past Medical History   Diagnosis  Date   .  Huntington disease    .  Atrial fibrillation    .  Chronic respiratory failure      nocturnal oxygen   .  Complication of anesthesia      felt surgery with epidural    Past Surgical History   Procedure  Date   .  Colon surgery    .  Abdominal hysterectomy    .  Hemorrhoid surgery    .  Tonsillectomy    .  Appendectomy     Consultants: [facility]CS and IR  Procedures: Scheduled for Cholecystostomy tube 2/28  Subjective: Patient admits to pain in abdomen and nausea along with dry heaves. Denies pain elsewhere.  Objective: Vital signs in last 24 hours: Temp:  [98.4 F (36.9 C)-99.5 F (37.5 C)] 98.9 F (37.2 C) (02/28 0623) Pulse Rate:  [67-75] 67  (02/28 0926) Resp:  [16-18] 16  (02/28 0623) BP: (140-145)/(78-79) 145/78 mmHg (02/28 0623) SpO2:  [92 %-95 %] 92 % (02/28 4401) Weight change:  Last BM Date: 09/23/11  Intake/Output from previous day: 02/27 0701 - 02/28 0700 In: 960 [P.O.:360; I.V.:600] Out: 1525 [Urine:1525] Intake/Output this shift:    General appearance: alert, cooperative and no distress    Resp: clear to auscultation bilaterally  Cardio: regular rate and rhythm, S1, S2 normal, no murmur, click, rub or gallop  Neurologic: Grossly normal. Has huntington chorea.   Lab Results:  Basename 09/25/11 0347 09/24/11 0325  WBC 9.0 13.2*  HGB 12.2 12.1  HCT 38.1 37.5  PLT 330 339   BMET  Basename 09/25/11 0347 09/24/11 1718 09/24/11 0325  NA 136 -- 136  K 3.8 -- 3.7  CL 103 -- 100  CO2 27 -- 31  GLUCOSE 94 -- 101*  BUN 7 -- 12  CREATININE 0.71 -- 0.74  CALCIUM 8.3* -- 8.6  ALT 8 9 --    Studies/Results: Dg Chest 2 View  09/23/2011  *RADIOLOGY REPORT*  Clinical Data: Confusion and weakness.  CHEST - 2 VIEW  Comparison: None.  Findings: There is cardiomegaly.  Lung volumes are low with small bilateral pleural effusions basilar airspace disease, worse on the left.  No pneumothorax is identified.  IMPRESSION: Small bilateral pleural effusions and basilar airspace disease, worse on the left, could reflect atelectasis or pneumonia.  Original Report Authenticated By: Bernadene Bell. Maricela Curet, M.D.   US Abdomen Complete  09/24/2011   *RADIOLOGY REPORT*  Clinical Data:  Nausea and vomiting.  COMPLETE ABDOMINAL ULTRASOUND  Comparison:  CT abdomen pelvis 09/23/2011 and ultrasound abdomen 12/23/2005.  Findings:  Gallbladder:  Shadowing echogenic stones measure up to 7 mm. Gallbladder wall measures 5  mm.  Edema is seen in the gallbladder fossa.  No sonographic Murphy's sign.  Common bile duct:  Measures 6 mm, within normal limits for age.  Liver:  Subtle intrahepatic biliary duct dilatation.  Parenchymal echogenicity is otherwise uniform.  IVC:  Appears normal.  Pancreas:  No focal abnormality seen.  Spleen:  Measures 6.7 cm, negative.  Right Kidney:  Measures 10.5 cm with uniform parenchymal echogenicity.  A 7 mm cyst is seen in the upper pole.  No hydronephrosis.  Left Kidney:  Measures 10.1 cm with uniform parenchymal echogenicity.  No hydronephrosis.  Abdominal aorta:  Distal abdominal aorta is  obscured by bowel gas. There is atherosclerotic plaque.  No aneurysm in the visualized portions.  Additional finding:  Small right pleural effusion.  IMPRESSION:  1.  Cholelithiasis with edema in the gallbladder fossa and gallbladder wall thickening.  Although no sonographic Murphy's sign is present, acute cholecystitis is considered. 2.  Mild intrahepatic biliary duct dilatation. 3.  Small right pleural effusion.  Original Report Authenticated By: Reyes Ivan, M.D.   Ct Abdomen Pelvis W Contrast  09/23/2011  *RADIOLOGY REPORT*  Clinical Data: Abdominal pain, nausea and vomiting, history of Huntington's disease, hysterectomy, appendectomy  CT ABDOMEN AND PELVIS WITH CONTRAST  Technique:  Multidetector CT imaging of the abdomen and pelvis was performed following the standard protocol during bolus administration of intravenous contrast.  Contrast:  75 ml Omnipaque-300  Comparison: CT abdomen pelvis - 12/07/2002; pelvic ultrasound - 06/01/2007.  Findings:  Normal hepatic contour.  7 mm hypoattenuating lesion within the right lobe of the liver (image 6, series 2) is too small to accurately characterize though favored to represent a hepatic cyst. The gallbladder and cystic duct are distended (the cystic duct measures approximately 1.3 mm in greatest coronal dimension - image 16, series five), however there is no gallbladder wall thickening or pericholecystic fluid.  No radiopaque gallstones.  The common bile duct is of normal caliber measuring 6 mm in diameter (image 33, series 2). No ascites.  There is symmetric enhancement and excretion of the bilateral kidneys.  No urinary obstruction.  No renal lesions.  The bilateral adrenal glands, pancreas and spleen are normal.  Ingested enteric contrast extends to the level of the mid small bowel.  There is mild dilatation of the proximal small bowel with index loop within the left mid hemiabdomen measuring 2.6 mm in diameter (image 46, series 2) without definite  evidence of enteric obstruction.  Moderate colonic stool burden.  Scattered colonic diverticulosis without evidence of diverticulitis.  No pneumoperitoneum, pneumatosis or portal venous gas.  Scattered atherosclerotic calcifications of a normal caliber abdominal aorta.  The major branch vessels of the abdominal aorta, including the IMA, are patent.  No retroperitoneal, mesenteric, pelvic or inguinal lymphadenopathy.  Interval development of bilateral cystic adnexal lesions with left adnexal lesion measuring approximately 4.1 x 2.4 cm (image 73, series 2) and right adnexal lesion measuring approximate 1.8 x 2.1 cm.  Post hysterectomy.  Surgical clips are again seen within the presacral space.  No free fluid in the pelvis.  Limited visualization of the lower thorax demonstrates small bilateral pleural effusions and bibasilar opacities, possibly atelectasis.  Possible air space opacity within the medial segment of the right middle lobe (image six, series four) worrisome for infection.  There is a possible 1.0 x 0.6 cm nodule within the basilar aspect of the right lower lobe (image five), not definitely seen on prior remote examination.  Stable findings of pectus deformity of  the visualized thorax.  No acute aggressive osseous abnormalities.  Degenerative change of the L5 - S1.  IMPRESSION: 1.  Mild dilatation of the proximal small bowel without definite evidence of enteric obstruction. 2.  The gallbladder and cystic duct are distended but otherwise normal.  This may be secondary to reported history of patient's fasting state.  Further evaluation may be obtained with a gallbladder ultrasound as clinically indicated. 3.  Interval development of bilateral cystic adnexal lesions which is an atypical finding in this postmenopausal female.  Further evaluation with pelvic ultrasound may be obtained as clinically indicated.  4.  Colonic diverticulosis without evidence of diverticulitis.  5. Small bilateral effusions with  possible airspace opacities within the middle lobe, worrisome for infection.  Clinical correlation is advised. 6.  Possible 1 cm nodule within the basilar aspect of the right lower lobe.  Further evaluation with dedicated chest CT may be obtained as clinically indicated.  Original Report Authenticated By: Waynard Reeds, M.D.    Medications:  Scheduled:   . aspirin EC  81 mg Oral Daily  . clonazePAM  1 mg Oral QHS  . digoxin  250 mcg Oral Daily  . enoxaparin  40 mg Subcutaneous QHS  . estrogens (conjugated)  0.625 mg Oral Daily  . feeding supplement  237 mL Oral TID WC  . FLUoxetine  20 mg Oral Daily  . haloperidol  0.5 mg Oral BID  . levofloxacin (LEVAQUIN) IV  750 mg Intravenous Q24H  . levothyroxine  50 mcg Oral Daily  . pantoprazole  40 mg Oral Daily  . scopolamine  1 patch Transdermal Q72H  . senna-docusate  2 tablet Oral BID  . DISCONTD: enoxaparin  40 mg Subcutaneous QHS    Assessment/Plan:  Principal Problem:  *Cholecystitis with cholelithiasis Active Problems:  Aspiration pneumonia  Huntington's disease  Atrial fibrillation  Vertigo    1. Cholecystitis with Cholelithiasis: Appreciate surgery input. Plan is for cholecystostomy tube placement by IR today. Patient stable but still symptomatic. Should improve once procedure is performed. LFT's normal. This has probably been ongoing for 2-3 weeks.  2. Dizzines: Likely due to Vertigo. Meclizine.   3. Atrial Fibrillation: rate controlled. Continue Digoxin. Not on anticoagulation.   4. H/O Huntington's Disease: Under hospice care.   5. Aspiration Pneumonia: Stable on treatment. Monitor closely.  6. DNR   Disposition: Await procedure. May need SNF if roommate/POA is unable to care for her, especially with the cholecystostomy tube.    LOS: 2 days   [facility] Pager [phone] 09/25/2011, 1:21 PM

## 2011-09-25 NOTE — Interval H&P Note (Cosign Needed)
History and Physical Interval Note:  09/25/2011 10:44 AM  Sydney [name] is tentatively scheduled for percutaneous cholecystostomy today.The various methods of treatment have been discussed with the patient/patient's POA. After consideration of risks, benefits and other options for treatment, the patient/patient's POA have consented to the above procedure.  The patients' history has been reviewed, patient examined, no change in status, stable for the above procedure.  I have reviewed the patients' chart and labs.  Questions were answered to the patient's satisfaction.   Past Medical History  Diagnosis Date  . Huntington disease   . Atrial fibrillation   . Chronic respiratory failure     nocturnal oxygen  . Complication of anesthesia     felt surgery with epidural   Past Surgical History  Procedure Date  . Colon surgery   . Abdominal hysterectomy   . Hemorrhoid surgery   . Tonsillectomy   . Appendectomy    Dg Chest 2 View  09/23/2011  *RADIOLOGY REPORT*  Clinical Data: Confusion and weakness.  CHEST - 2 VIEW  Comparison: None.  Findings: There is cardiomegaly.  Lung volumes are low with small bilateral pleural effusions basilar airspace disease, worse on the left.  No pneumothorax is identified.  IMPRESSION: Small bilateral pleural effusions and basilar airspace disease, worse on the left, could reflect atelectasis or pneumonia.  Original Report Authenticated By: Bernadene Bell. Maricela Curet, M.D.   US Abdomen Complete  09/24/2011   *RADIOLOGY REPORT*  Clinical Data:  Nausea and vomiting.  COMPLETE ABDOMINAL ULTRASOUND  Comparison:  CT abdomen pelvis 09/23/2011 and ultrasound abdomen 12/23/2005.  Findings:  Gallbladder:  Shadowing echogenic stones measure up to 7 mm. Gallbladder wall measures 5 mm.  Edema is seen in the gallbladder fossa.  No sonographic Murphy's sign.  Common bile duct:  Measures 6 mm, within normal limits for age.  Liver:  Subtle intrahepatic biliary duct dilatation.  Parenchymal  echogenicity is otherwise uniform.  IVC:  Appears normal.  Pancreas:  No focal abnormality seen.  Spleen:  Measures 6.7 cm, negative.  Right Kidney:  Measures 10.5 cm with uniform parenchymal echogenicity.  A 7 mm cyst is seen in the upper pole.  No hydronephrosis.  Left Kidney:  Measures 10.1 cm with uniform parenchymal echogenicity.  No hydronephrosis.  Abdominal aorta:  Distal abdominal aorta is obscured by bowel gas. There is atherosclerotic plaque.  No aneurysm in the visualized portions.  Additional finding:  Small right pleural effusion.  IMPRESSION:  1.  Cholelithiasis with edema in the gallbladder fossa and gallbladder wall thickening.  Although no sonographic Murphy's sign is present, acute cholecystitis is considered. 2.  Mild intrahepatic biliary duct dilatation. 3.  Small right pleural effusion.  Original Report Authenticated By: Reyes Ivan, M.D.   Ct Abdomen Pelvis W Contrast  09/23/2011  *RADIOLOGY REPORT*  Clinical Data: Abdominal pain, nausea and vomiting, history of Huntington's disease, hysterectomy, appendectomy  CT ABDOMEN AND PELVIS WITH CONTRAST  Technique:  Multidetector CT imaging of the abdomen and pelvis was performed following the standard protocol during bolus administration of intravenous contrast.  Contrast:  75 ml Omnipaque-300  Comparison: CT abdomen pelvis - 12/07/2002; pelvic ultrasound - 06/01/2007.  Findings:  Normal hepatic contour.  7 mm hypoattenuating lesion within the right lobe of the liver (image 6, series 2) is too small to accurately characterize though favored to represent a hepatic cyst. The gallbladder and cystic duct are distended (the cystic duct measures approximately 1.3 mm in greatest coronal dimension - image 16, series five),  however there is no gallbladder wall thickening or pericholecystic fluid.  No radiopaque gallstones.  The common bile duct is of normal caliber measuring 6 mm in diameter (image 33, series 2). No ascites.  There is symmetric  enhancement and excretion of the bilateral kidneys.  No urinary obstruction.  No renal lesions.  The bilateral adrenal glands, pancreas and spleen are normal.  Ingested enteric contrast extends to the level of the mid small bowel.  There is mild dilatation of the proximal small bowel with index loop within the left mid hemiabdomen measuring 2.6 mm in diameter (image 46, series 2) without definite evidence of enteric obstruction.  Moderate colonic stool burden.  Scattered colonic diverticulosis without evidence of diverticulitis.  No pneumoperitoneum, pneumatosis or portal venous gas.  Scattered atherosclerotic calcifications of a normal caliber abdominal aorta.  The major branch vessels of the abdominal aorta, including the IMA, are patent.  No retroperitoneal, mesenteric, pelvic or inguinal lymphadenopathy.  Interval development of bilateral cystic adnexal lesions with left adnexal lesion measuring approximately 4.1 x 2.4 cm (image 73, series 2) and right adnexal lesion measuring approximate 1.8 x 2.1 cm.  Post hysterectomy.  Surgical clips are again seen within the presacral space.  No free fluid in the pelvis.  Limited visualization of the lower thorax demonstrates small bilateral pleural effusions and bibasilar opacities, possibly atelectasis.  Possible air space opacity within the medial segment of the right middle lobe (image six, series four) worrisome for infection.  There is a possible 1.0 x 0.6 cm nodule within the basilar aspect of the right lower lobe (image five), not definitely seen on prior remote examination.  Stable findings of pectus deformity of the visualized thorax.  No acute aggressive osseous abnormalities.  Degenerative change of the L5 - S1.  IMPRESSION: 1.  Mild dilatation of the proximal small bowel without definite evidence of enteric obstruction. 2.  The gallbladder and cystic duct are distended but otherwise normal.  This may be secondary to reported history of patient's fasting state.   Further evaluation may be obtained with a gallbladder ultrasound as clinically indicated. 3.  Interval development of bilateral cystic adnexal lesions which is an atypical finding in this postmenopausal female.  Further evaluation with pelvic ultrasound may be obtained as clinically indicated.  4.  Colonic diverticulosis without evidence of diverticulitis.  5. Small bilateral effusions with possible airspace opacities within the middle lobe, worrisome for infection.  Clinical correlation is advised. 6.  Possible 1 cm nodule within the basilar aspect of the right lower lobe.  Further evaluation with dedicated chest CT may be obtained as clinically indicated.  Original Report Authenticated By: Waynard Reeds, M.D.  Results for orders placed during the hospital encounter of 09/23/11  CBC      Component Value Range   WBC 21.0 (*) 4.0 - 10.5 (K/uL)   RBC 4.46  3.87 - 5.11 (MIL/uL)   Hemoglobin 13.7  12.0 - 15.0 (g/dL)   HCT 56.2  13.0 - 86.5 (%)   MCV 91.7  78.0 - 100.0 (fL)   MCH 30.7  26.0 - 34.0 (pg)   MCHC 33.5  30.0 - 36.0 (g/dL)   RDW 78.4  69.6 - 29.5 (%)   Platelets 372  150 - 400 (K/uL)  DIFFERENTIAL      Component Value Range   Neutrophils Relative 88 (*) 43 - 77 (%)   Neutro Abs 18.5 (*) 1.7 - 7.7 (K/uL)   Lymphocytes Relative 6 (*) 12 - 46 (%)  Lymphs Abs 1.2  0.7 - 4.0 (K/uL)   Monocytes Relative 6  3 - 12 (%)   Monocytes Absolute 1.2 (*) 0.1 - 1.0 (K/uL)   Eosinophils Relative 0  0 - 5 (%)   Eosinophils Absolute 0.0  0.0 - 0.7 (K/uL)   Basophils Relative 0  0 - 1 (%)   Basophils Absolute 0.0  0.0 - 0.1 (K/uL)  COMPREHENSIVE METABOLIC PANEL      Component Value Range   Sodium 138  135 - 145 (mEq/L)   Potassium 3.9  3.5 - 5.1 (mEq/L)   Chloride 96  96 - 112 (mEq/L)   CO2 33 (*) 19 - 32 (mEq/L)   Glucose, Bld 103 (*) 70 - 99 (mg/dL)   BUN 15  6 - 23 (mg/dL)   Creatinine, Ser 1.61  0.50 - 1.10 (mg/dL)   Calcium 9.6  8.4 - 09.6 (mg/dL)   Total Protein 7.6  6.0 - 8.3 (g/dL)    Albumin 3.4 (*) 3.5 - 5.2 (g/dL)   AST 10  0 - 37 (U/L)   ALT 11  0 - 35 (U/L)   Alkaline Phosphatase 129 (*) 39 - 117 (U/L)   Total Bilirubin 0.3  0.3 - 1.2 (mg/dL)   GFR calc non Af Amer 84 (*) >90 (mL/min)   GFR calc Af Amer >90  >90 (mL/min)  LIPASE, BLOOD      Component Value Range   Lipase 14  11 - 59 (U/L)  URINALYSIS, ROUTINE W REFLEX MICROSCOPIC      Component Value Range   Color, Urine YELLOW  YELLOW    APPearance CLOUDY (*) CLEAR    Specific Gravity, Urine 1.025  1.005 - 1.030    pH 8.0  5.0 - 8.0    Glucose, UA NEGATIVE  NEGATIVE (mg/dL)   Hgb urine dipstick TRACE (*) NEGATIVE    Bilirubin Urine NEGATIVE  NEGATIVE    Ketones, ur NEGATIVE  NEGATIVE (mg/dL)   Protein, ur 30 (*) NEGATIVE (mg/dL)   Urobilinogen, UA 0.2  0.0 - 1.0 (mg/dL)   Nitrite NEGATIVE  NEGATIVE    Leukocytes, UA NEGATIVE  NEGATIVE   DIGOXIN LEVEL      Component Value Range   Digoxin Level 1.4  0.8 - 2.0 (ng/mL)  TROPONIN I      Component Value Range   Troponin I <0.30  <0.30 (ng/mL)  URINE MICROSCOPIC-ADD ON      Component Value Range   Squamous Epithelial / LPF MANY (*) RARE    WBC, UA 0-2  <3 (WBC/hpf)   RBC / HPF 0-2  <3 (RBC/hpf)   Bacteria, UA RARE  RARE   CULTURE, BLOOD (ROUTINE X 2)      Component Value Range   Specimen Description BLOOD RIGHT ANTECUBITAL     Special Requests BOTTLES DRAWN AEROBIC ONLY 4CC     Culture  Setup Time [id number]     Culture       Value:        BLOOD CULTURE RECEIVED NO GROWTH TO DATE CULTURE WILL BE HELD FOR 5 DAYS BEFORE ISSUING A FINAL NEGATIVE REPORT   Report Status PENDING    CULTURE, BLOOD (ROUTINE X 2)      Component Value Range   Specimen Description BLOOD RIGHT ANTECUBITAL     Special Requests BOTTLES DRAWN AEROBIC AND ANAEROBIC 10CC EACH     Culture  Setup Time [id number]     Culture       Value:  BLOOD CULTURE RECEIVED NO GROWTH TO DATE CULTURE WILL BE HELD FOR 5 DAYS BEFORE ISSUING A FINAL NEGATIVE REPORT   Report Status PENDING     BASIC METABOLIC PANEL      Component Value Range   Sodium 136  135 - 145 (mEq/L)   Potassium 3.7  3.5 - 5.1 (mEq/L)   Chloride 100  96 - 112 (mEq/L)   CO2 31  19 - 32 (mEq/L)   Glucose, Bld 101 (*) 70 - 99 (mg/dL)   BUN 12  6 - 23 (mg/dL)   Creatinine, Ser 1.61  0.50 - 1.10 (mg/dL)   Calcium 8.6  8.4 - 09.6 (mg/dL)   GFR calc non Af Amer 81 (*) >90 (mL/min)   GFR calc Af Amer >90  >90 (mL/min)  CBC      Component Value Range   WBC 13.2 (*) 4.0 - 10.5 (K/uL)   RBC 4.09  3.87 - 5.11 (MIL/uL)   Hemoglobin 12.1  12.0 - 15.0 (g/dL)   HCT 04.5  40.9 - 81.1 (%)   MCV 91.7  78.0 - 100.0 (fL)   MCH 29.6  26.0 - 34.0 (pg)   MCHC 32.3  30.0 - 36.0 (g/dL)   RDW 91.4  78.2 - 95.6 (%)   Platelets 339  150 - 400 (K/uL)  HEPATIC FUNCTION PANEL      Component Value Range   Total Protein 6.5  6.0 - 8.3 (g/dL)   Albumin 2.8 (*) 3.5 - 5.2 (g/dL)   AST 8  0 - 37 (U/L)   ALT 9  0 - 35 (U/L)   Alkaline Phosphatase 117  39 - 117 (U/L)   Total Bilirubin 0.2 (*) 0.3 - 1.2 (mg/dL)   Bilirubin, Direct <2.1  0.0 - 0.3 (mg/dL)   Indirect Bilirubin NOT CALCULATED  0.3 - 0.9 (mg/dL)  PROTIME-INR      Component Value Range   Prothrombin Time 13.1  11.6 - 15.2 (seconds)   INR 0.97  0.00 - 1.49   APTT      Component Value Range   aPTT 37  24 - 37 (seconds)  CBC      Component Value Range   WBC 9.0  4.0 - 10.5 (K/uL)   RBC 4.10  3.87 - 5.11 (MIL/uL)   Hemoglobin 12.2  12.0 - 15.0 (g/dL)   HCT 30.8  65.7 - 84.6 (%)   MCV 92.9  78.0 - 100.0 (fL)   MCH 29.8  26.0 - 34.0 (pg)   MCHC 32.0  30.0 - 36.0 (g/dL)   RDW 96.2  95.2 - 84.1 (%)   Platelets 330  150 - 400 (K/uL)  COMPREHENSIVE METABOLIC PANEL      Component Value Range   Sodium 136  135 - 145 (mEq/L)   Potassium 3.8  3.5 - 5.1 (mEq/L)   Chloride 103  96 - 112 (mEq/L)   CO2 27  19 - 32 (mEq/L)   Glucose, Bld 94  70 - 99 (mg/dL)   BUN 7  6 - 23 (mg/dL)   Creatinine, Ser 3.24  0.50 - 1.10 (mg/dL)   Calcium 8.3 (*) 8.4 - 10.5 (mg/dL)   Total  Protein 6.0  6.0 - 8.3 (g/dL)   Albumin 2.5 (*) 3.5 - 5.2 (g/dL)   AST 7  0 - 37 (U/L)   ALT 8  0 - 35 (U/L)   Alkaline Phosphatase 99  39 - 117 (U/L)   Total Bilirubin 0.3  0.3 - 1.2 (mg/dL)  GFR calc non Af Amer 82 (*) >90 (mL/min)   GFR calc Af Amer >90  >90 (mL/min)     Aubrey Voong,D Caryn Bee

## 2011-09-26 LAB — COMPREHENSIVE METABOLIC PANEL
ALT: 7 U/L (ref 0–35)
Albumin: 2.6 g/dL — ABNORMAL LOW (ref 3.5–5.2)
Alkaline Phosphatase: 99 U/L (ref 39–117)
BUN: 10 mg/dL (ref 6–23)
Chloride: 105 mEq/L (ref 96–112)
Potassium: 5 mEq/L (ref 3.5–5.1)
Sodium: 138 mEq/L (ref 135–145)
Total Bilirubin: 0.3 mg/dL (ref 0.3–1.2)
Total Protein: 6.1 g/dL (ref 6.0–8.3)

## 2011-09-26 LAB — CBC
HCT: 38.3 % (ref 36.0–46.0)
Hemoglobin: 12.1 g/dL (ref 12.0–15.0)
RDW: 14.1 % (ref 11.5–15.5)
WBC: 10.4 10*3/uL (ref 4.0–10.5)

## 2011-09-26 MED ORDER — LEVOFLOXACIN IN D5W 750 MG/150ML IV SOLN
750.0000 mg | INTRAVENOUS | Status: DC
  Administered 2011-09-27: 750 mg via INTRAVENOUS
  Filled 2011-09-26: qty 150

## 2011-09-26 MED ORDER — SODIUM CHLORIDE 0.9 % IV SOLN
INTRAVENOUS | Status: DC
  Administered 2011-09-26 – 2011-09-28 (×5): via INTRAVENOUS
  Administered 2011-09-28: 1000 mL via INTRAVENOUS
  Administered 2011-09-29: 14:00:00 via INTRAVENOUS
  Administered 2011-10-01: 1000 mL via INTRAVENOUS

## 2011-09-26 NOTE — Evaluation (Signed)
Physical Therapy Evaluation Patient Details Name: Sydney [name] MRN: [id number] DOB: 14-Sep-1934 Today's Date: 09/26/2011  Problem List:  Patient Active Problem List  Diagnoses  . Aspiration pneumonia  . Huntington's disease  . Atrial fibrillation  . Cholecystitis with cholelithiasis  . Vertigo    Past Medical History:  Past Medical History  Diagnosis Date  . Huntington disease   . Atrial fibrillation   . Chronic respiratory failure     nocturnal oxygen  . Complication of anesthesia     felt surgery with epidural   Past Surgical History:  Past Surgical History  Procedure Date  . Colon surgery   . Abdominal hysterectomy   . Hemorrhoid surgery   . Tonsillectomy   . Appendectomy     PT Assessment/Plan/Recommendation PT Assessment Clinical Impression Statement: Patient with Huntingtons admitted with abdominal pain now s/p percutaneous chole drain.  May benefit from skilled PT in acute setting for mobilization to limit decline in function.  Per sister of caregiver, her mobility is at current baeline, Do not recommend follow up PT at discharge.  Per sister of caregiver there are no equipment needs. PT Recommendation/Assessment: Patient will need skilled PT in the acute care venue PT Problem List: Decreased activity tolerance;Pain;Decreased mobility PT Therapy Diagnosis : Generalized weakness;Acute pain PT Plan PT Frequency: Min 3X/week PT Treatment/Interventions: DME instruction;Patient/family education;Functional mobility training;Therapeutic activities PT Recommendation Follow Up Recommendations: No PT follow up Equipment Recommended: None recommended by PT PT Goals  Acute Rehab PT Goals PT Goal Formulation: With patient/family Time For Goal Achievement: 2 weeks Pt will go Supine/Side to Sit: with mod assist PT Goal: Supine/Side to Sit - Progress: Goal set today Pt will go Sit to Supine/Side: with mod assist PT Goal: Sit to Supine/Side - Progress: Goal set today Pt  will Transfer Bed to Chair/Chair to Bed: with mod assist PT Transfer Goal: Bed to Chair/Chair to Bed - Progress: Goal set today  PT Evaluation Precautions/Restrictions  Precautions Precautions: Fall Precaution Comments: esp due to uncontrolled movements with Huntingtons Prior Functioning  Home Living Receives Help From: Personal care attendant Home Layout: One level Home Access: Stairs to enter [employer] of Steps: lift for into house Bathroom Shower/Tub:  (garden tub) Home Adaptive Equipment: Hospital bed;Wheelchair - manual Prior Function Level of Independence: Needs assistance with tranfers;Needs assistance with ADLs Cognition Cognition Arousal/Alertness: Awake/alert Overall Cognitive Status: History of cognitive impairments History of Cognitive Impairment: Appears at baseline functioning Sensation/Coordination   Extremity Assessment RLE Assessment RLE Assessment: Exceptions to [facility] RLE AROM (degrees) Overall AROM Right Lower Extremity: Within functional limits for tasks assessed RLE Strength RLE Overall Strength Comments: able to move antigravity; but cannot assess strength due to Huntingtons LLE Assessment LLE Assessment: Exceptions to WFL LLE AROM (degrees) Overall AROM Left Lower Extremity: Within functional limits for tasks assessed LLE Strength LLE Overall Strength Comments: able to move antigravity; but cannot assess strength due to Huntingtons Mobility (including Balance) Bed Mobility Bed Mobility: Yes Supine to Sit: 2: Max assist Supine to Sit Details (indicate cue type and reason): asssit for legs over bed and to lift trunk; attempted to assist to roll to side then sit, but patient resistant Sitting - Scoot to Edge of Bed: 3: Mod assist Sit to Supine: 1: +2 Total assist Sit to Supine - Details (indicate cue type and reason): sister of caregiver in room and assisting with mobility Transfers Transfers: Yes Stand Pivot Transfers: 2: Max  assist Stand Pivot Transfer Details (indicate cue type and reason): patient  able to stand, needing assist to control and guide movements to allow safe transfer bed to 3:1 and back to bed. Ambulation/Gait Ambulation/Gait: No    Exercise    End of Session PT - End of Session Activity Tolerance: Patient limited by pain Patient left: in bed;with call bell in reach;with family/visitor present General Behavior During Session:  (mostly WFL, but agitated when in pain) Cognition: Impaired, at baseline  [facility] 09/26/2011, 3:28 PM

## 2011-09-26 NOTE — Progress Notes (Signed)
ANTIBIOTIC CONSULT NOTE - FOLLOW UP  Pharmacy Consult for  Levaquin Indication: acute cholecystitis (s/p perc drain), Aspiration PNA  Allergies  Allergen Reactions  . Codeine Itching  . Penicillins Cross Reactors Hives    Patient Measurements: Height: 5\' 6"  (167.6 cm) Weight: 127 lb (57.607 kg) IBW/kg (Calculated) : 59.3    Vital Signs: Temp: 98.3 F (36.8 C) (03/01 0455) Temp src: Oral (03/01 0455) BP: 98/62 mmHg (03/01 0455) Pulse Rate: 89  (03/01 0455) Intake/Output from previous day: 02/28 0701 - 03/01 0700 In: 3143 [I.V.:3143] Out: 860 [Urine:800; Drains:60] Intake/Output from this shift:    Labs:  Basename 09/26/11 0330 09/25/11 0347 09/24/11 0325  WBC 10.4 9.0 13.2*  HGB 12.1 12.2 12.1  PLT 343 330 339  LABCREA -- -- --  CREATININE 1.29* 0.71 0.74   Estimated Creatinine Clearance: 34.3 ml/min (by C-G formula based on Cr of 1.29).    Microbiology: Recent Results (from the past 720 hour(s))  CULTURE, BLOOD (ROUTINE X 2)     Status: Normal (Preliminary result)   Collection Time   09/23/11  9:10 PM      Component Value Range Status Comment   Specimen Description BLOOD RIGHT ANTECUBITAL   Final    Special Requests BOTTLES DRAWN AEROBIC AND ANAEROBIC 10CC EACH   Final    Culture  Setup Time [id number]   Final    Culture     Final    Value:        BLOOD CULTURE RECEIVED NO GROWTH TO DATE CULTURE WILL BE HELD FOR 5 DAYS BEFORE ISSUING A FINAL NEGATIVE REPORT   Report Status PENDING   Incomplete   CULTURE, BLOOD (ROUTINE X 2)     Status: Normal (Preliminary result)   Collection Time   09/23/11  9:15 PM      Component Value Range Status Comment   Specimen Description BLOOD RIGHT ANTECUBITAL   Final    Special Requests BOTTLES DRAWN AEROBIC ONLY 4CC   Final    Culture  Setup Time [id number]   Final    Culture     Final    Value:        BLOOD CULTURE RECEIVED NO GROWTH TO DATE CULTURE WILL BE HELD FOR 5 DAYS BEFORE ISSUING A FINAL NEGATIVE REPORT   Report  Status PENDING   Incomplete   CULTURE, ROUTINE-ABSCESS     Status: Normal (Preliminary result)   Collection Time   09/25/11  2:46 PM      Component Value Range Status Comment   Specimen Description GALL BLADDER ASPIRATE   Final    Special Requests Normal   Final    Gram Stain     Final    Value: MODERATE WBC PRESENT,BOTH PMN AND MONONUCLEAR     NO SQUAMOUS EPITHELIAL CELLS SEEN     NO ORGANISMS SEEN   Culture NO GROWTH 1 DAY   Final    Report Status PENDING   Incomplete     Anti-infectives     Start     Dose/Rate Route Frequency Ordered Stop   09/25/11 1430   ciprofloxacin (CIPRO) IVPB 400 mg        400 mg 200 mL/hr over 60 Minutes Intravenous  Once 09/25/11 1417 09/25/11 1520   09/23/11 2030   Levofloxacin (LEVAQUIN) IVPB 750 mg        750 mg 100 mL/hr over 90 Minutes Intravenous Every 24 hours 09/23/11 2018 10/01/11 2029  Assessment:  77 YOF on Day # 4 Levaquin 750mg  IV q24 for aspiration PNA/acute choleystitis  Had perc chole drain placed in IR 2/28, 60cc drained so far. Drain cx NGTD. Bcx x 2 NGTD. Afebrile.  Scr has increased, CrCl now <50 (34.3) ml/min  Goal of Therapy:  Appropriate renal dose of Levaquin  Plan:   Change levaquin to 750mg  IV q48h  Continue to monitor and adjust dose as necessary.  Would recommend if appropriate to add another abx with better anaerobic coverage such as Flagyl or clindamycin in addition to Levaquin for better anaerobic coverage in pt w/ aspiration PNA  Gwen Her PharmD  [phone] 09/26/2011 10:38 AM

## 2011-09-26 NOTE — Progress Notes (Signed)
CSW received call from [facility] Social Worker, Sharlette Dense (ph#: [phone]) informing CSW that patient's Sydney [name] (home#: [phone] cell#: [phone]) and patient expressed interest in [facility] residential hospice facility. CSW made Forrestine Him, [facility] Liaison aware who will check for eligibility. CSW will follow up on Monday re: discharge plans.   Unice Bailey, LCSWA [phone]

## 2011-09-26 NOTE — Progress Notes (Signed)
Pt lying in bed awake.  Just finished eating lunch.  Per her sister Dois Davenport the pt has started to get back her appetite since the biliary tube was inserted.  Pt denies pain and or SOB.  Last BM 09/23/11.   WBC- 10.4 (down from admission 13.2,) Creatinine 1.29, calcium 8.1.  VS today are 98.3, P-89, BP 98/62.  Biliary drain day 1, Foley catheter day 1.  Anticipated discharge 2-3 days.   Please Call [facility] with any pt movement and or concerns at [phone].  Elijah Birk, RN, Homecare

## 2011-09-26 NOTE — Progress Notes (Signed)
Subjective: Had cholecystostomy tube placed yesterday. POA reports pt was agitated overnight. States pt had no N/v  Objective: Vital signs in last 24 hours: Temp:  [96.8 F (36 C)-98.8 F (37.1 C)] 98.3 F (36.8 C) (03/01 0455) Pulse Rate:  [67-89] 89  (03/01 0455) Resp:  [13-20] 18  (03/01 0455) BP: (98-166)/(62-83) 98/62 mmHg (03/01 0455) SpO2:  [90 %-99 %] 93 % (03/01 0455) Last BM Date: 09/23/11  Intake/Output from previous day: 02/28 0701 - 03/01 0700 In: 3143 [I.V.:3143] Out: 860 [Urine:800; Drains:60] Intake/Output this shift:    Pt asleep. abd soft, nd, nt. Drain in place - light bile in drain (slept thru exam)  Lab Results:   Basename 09/26/11 0330 09/25/11 0347  WBC 10.4 9.0  HGB 12.1 12.2  HCT 38.3 38.1  PLT 343 330   BMET  Basename 09/26/11 0330 09/25/11 0347  NA 138 136  K 5.0 3.8  CL 105 103  CO2 28 27  GLUCOSE 92 94  BUN 10 7  CREATININE 1.29* 0.71  CALCIUM 8.1* 8.3*   PT/INR  Basename 09/24/11 1718  LABPROT 13.1  INR 0.97   ABG No results found for this basename: PHART:2,PCO2:2,PO2:2,HCO3:2 in the last 72 hours  Studies/Results: Ir Perc Cholecystostomy  09/25/2011  *RADIOLOGY REPORT*  Clinical Data: Acute cholecystitis, nonoperative candidate  ULTRASOUND AND FLUOROSCOPIC TRANSHEPATIC PERCUTANEOUS CHOLECYSTOSTOMY  Date:  09/25/2011 13:45:00  Radiologist:  M. Ruel Favors, M.D.  Medications:  400 mg Cipro administered within 1 hour of the procedure, 1 mg Versed, 50 mcg Fentanyl  Guidance:  Ultrasound fluoroscopic  Fluoroscopy time:  0.8 minutes  Sedation time:  15 minutes  Contrast volume:  5 ml Omnipaque-300  Complications:  No immediate  PROCEDURE/FINDINGS:  Informed consent was obtained from the patient following explanation of the procedure, risks, benefits and alternatives. The patient understands, agrees and consents for the procedure. All questions were addressed.  A time out was performed.  Maximal barrier sterile technique utilized  including caps, mask, sterile gowns, sterile gloves, large sterile drape, hand hygiene, and betadine  The previous imaging was reviewed.  Acute cholecystitis evident by CT and ultrasound.  The patient was positioned supine.  Preliminary ultrasound performed of the right upper quadrant.  Abnormal gallbladder demonstrated.  Under sterile conditions and local anesthesia, a 20 gauge 15 cm access needle was advanced from a right intercostal transhepatic approach into the gallbladder.  Needle position confirmed with ultrasound.  Images obtained for documentation. Guide wire advanced followed by the Accustick dilator set.  This allowed exchange for an Amplatz guide wire.  Tract dilatation performed to advance a 10-French catheter with the retention loop formed in the gallbladder.  Syringe aspiration yielded 35 ml exudative bile.  Sample sent for Gram stain and culture.  Small injection performed to confirm position.  Diffuse filling defects noted compatible with cholelithiasis.  Images obtained for documentation.  Catheter secured with a Prolene suture and connected to external gravity drainage bag.  Sterile dressing applied to the site.  No immediate complication.  The patient tolerated the procedure well.  IMPRESSION:  Successful ultrasound and fluoroscopic percutaneous transhepatic 10- French cholecystostomy.  Original Report Authenticated By: Judie Petit. Ruel Favors, M.D.    Anti-infectives: Anti-infectives     Start     Dose/Rate Route Frequency Ordered Stop   09/25/11 1430   ciprofloxacin (CIPRO) IVPB 400 mg        400 mg 200 mL/hr over 60 Minutes Intravenous  Once 09/25/11 1417 09/25/11 1520   09/23/11 2030  Levofloxacin (LEVAQUIN) IVPB 750 mg        750 mg 100 mL/hr over 90 Minutes Intravenous Every 24 hours 09/23/11 2018 10/01/11 2029          Assessment/Plan: Patient Active Problem List  Diagnoses  . Aspiration pneumonia  . Huntington's disease  . Atrial fibrillation  . Cholecystitis with  cholelithiasis  . Vertigo   S/p cholecystostomy tube placement (appreciate IR assistance)  Pt will need total of 2 weeks of abx for GB - can be converted to oral Can resume clear diet - however, would recommend considering speech path consult to assess pt's swallow  GB drain needs to stay in for 8-12 weeks. As stated before, this is a difficult situation and there is no good answer of how to manage this long-term in a pt with Huntingtons.  With Drain removal after resolution of acute cholecystitis, she would be at risk for recurrent cholecystitis in the future.  Will need to see how pt improves overall before deciding whether or not surgery is a realistic option in the future.   Sydney [name]. Andrey Campanile, MD, FACS General, Bariatric, & Minimally Invasive Surgery [facility], [place]    LOS: 3 days    Sydney [name] 09/26/2011

## 2011-09-26 NOTE — Evaluation (Signed)
Clinical/Bedside Swallow Evaluation Patient Details  Name: Sydney [name] MRN: [id number] DOB: 09/25/1934 Today's Date: 09/26/2011 [id number]  Past Medical History:  Past Medical History  Diagnosis Date  . Huntington disease   . Atrial fibrillation   . Chronic respiratory failure     nocturnal oxygen  . Complication of anesthesia     felt surgery with epidural   Past Surgical History:  Past Surgical History  Procedure Date  . Colon surgery   . Abdominal hysterectomy   . Hemorrhoid surgery   . Tonsillectomy   . Appendectomy    HPI:  77 yo female with h/o Huntington's disease adm to [facility] after n/v x3 weeks noted.  Pt found to have bilateral airspace disease-worse on left and small bilateral ple effusion concerning for asp pna or pneumonitis 09/23/11.  Pt has a good appetite at home per family present but does have dysphagia and will occasionally aspirate.  Heimlich manuever has been required previously and most notably in the last few weeks and she choked on eggs in the hospital today.  Pt normally eats soft foods at home and she enjoys 7 up, icecream and ensure.    Pt is s/p cholecystostomy tube as she is not a candidate for operation.  Pt has not had recurent pneumonias per family and is followed by hospice at home.     Assessment/Recommendations/Treatment Plan Suspected Esophageal Findings Suspected Esophageal Findings: Belching (family reports pt belches with sodas) and pt takes reflux medications  SLP Assessment Clinical Impression Statement: Multifactorial dysphagia with oropharyngeal and suspected esophageal issues present due to pt's huntington's chorea.  Delayed oral transiting, decr coordination noted but overall functional for pt.  Pt without s/s of aspiration with po observed but family does acknowledge h/o heimlich manuever required and choking.  SLP did not locate in EPIC system where pt has had pna before this hospitalization and agree that asp pna may be due to emesis  aspiration prior to admission.  Pt although relying on others to feed her, is controlling her eating and thus maximizing her airway protection by indicating when she is ready for another bite (opening mouth or stating so), and when she needs a break *"breathe" as she states.   Ongoing aspiration and aspiration pna risk present, however this family appears to manage pt's swallow dyfunction welll and she is followed by hospice at home with goals of comfort  SLP provided written and verbal tips for diet modifiers, tips to ease swallow or discomfort if coughing with po.  Also a family friend stated that pt's daughter will "walk" pt at times when she is feeling something "stuck", SLP questions contribution of esophageal issues as well.  Family and pt verbalized understanding to information.  Of note, pt used thickener when admitted and disliked it per her family, advised to use if increased comfort by decr overt coughing.    Swallow Evaluation Recommendations Solid Consistency: Dysphagia 3 (Mechanical soft) Liquid Consistency: Thin Liquid Administration via: Straw Medication Administration:  (as tolerated) Supervision: Staff feed patient;Full supervision/cueing for compensatory strategies Compensations: Slow rate;Small sips/bites;Check for pocketing Oral Care Recommendations: Oral care BID Follow up Recommendations: None      Individuals Consulted Consulted and Agree with Results and Recommendations: Patient;Family member/caregiver  Swallow Study Prior Functional Status  Receives Help From: Personal care attendant  General  Date of Onset: 09/26/11 HPI: 77 yo female with h/o Huntington's disease adm to [facility] after n/v x3 weeks noted.  Pt found to have bilateral airspace disease-worse on left  and small bilateral ple effusion concerning for asp pna or pneumonitis 09/23/11.  Pt has a good appetite at home per family present but does have dysphagia and will occasionally aspirate.  Heimlich manuever has  been required previously and most notably in the last few weeks and she choked on eggs in the hospital today.  Pt normally eats soft foods at home and she enjoys 7 up, icecream and ensure.    Pt is s/p cholecystostomy tube as she is not a candidate for operation.  Pt has not had recurent pneumonias per family and is followed by hospice at home.   Type of Study: Bedside swallow evaluation Diet Prior to this Study: Dysphagia 3 (soft);Thin liquids Respiratory Status: Room air Behavior/Cognition: Alert;Cooperative Oral Cavity - Dentition: Adequate natural dentition;Poor condition (family brushes pt's teeth) Vision:  (pt requires someone to feed her d/t huntington's ) Patient Positioning: Postural control interferes with function (Huntington's chorea) Baseline Vocal Quality: Clear Volitional Cough: Strong Volitional Swallow: Unable to elicit  Oral Motor/Sensory Function  Overall Oral Motor/Sensory Function: Impaired at baseline (huntington's)  Consistency Results  Ice Chips Ice chips: Not tested  Thin Liquid Thin Liquid: Impaired Presentation: Straw Oral Phase Impairments: Reduced lingual movement/coordination Pharyngeal  Phase Impairments: Multiple swallows;Delayed Swallow Other Comments: fed by family member  Nectar Thick Liquid Nectar Thick Liquid: Impaired Presentation: Straw Oral Phase Impairments: Reduced lingual movement/coordination Pharyngeal Phase Impairments: Delayed Swallow;Multiple swallows Other Comments: audible swallow- Ensure  Honey Thick Liquid Honey Thick Liquid: Not tested  Puree Puree: Impaired Presentation: Spoon Pharyngeal Phase Impairments: Delayed Swallow Other Comments: icecream:   audible swallow  Solid Solid: Not tested Other Comments: pt needed to have a bowel movement and stated she could not masticate the cracker provided-no soft food options present   Chales Abrahams 09/26/2011,4:08 PM

## 2011-09-26 NOTE — Progress Notes (Signed)
Subjective: Perc chole drain placed 2/28 Pt feels better; up in bed eating   Objective: Vital signs in last 24 hours: Temp:  [96.8 F (36 C)-98.8 F (37.1 C)] 98.3 F (36.8 C) (03/01 0455) Pulse Rate:  [71-89] 89  (03/01 0455) Resp:  [13-20] 18  (03/01 0455) BP: (98-166)/(62-83) 98/62 mmHg (03/01 0455) SpO2:  [90 %-99 %] 93 % (03/01 0455) Last BM Date: 09/23/11  Intake/Output from previous day: 02/28 0701 - 03/01 0700 In: 3143 [I.V.:3143] Out: 860 [Urine:800; Drains:60] Intake/Output this shift:    PE:  Afeb; VSS Wbc stable Site of drain is clean and dry NT; no bleeding Output 60 cc 2/28 Serous now; 10 cc in bag   Lab Results:   [facility] 09/26/11 0330 09/25/11 0347  WBC 10.4 9.0  HGB 12.1 12.2  HCT 38.3 38.1  PLT 343 330   BMET  Basename 09/26/11 0330 09/25/11 0347  NA 138 136  K 5.0 3.8  CL 105 103  CO2 28 27  GLUCOSE 92 94  BUN 10 7  CREATININE 1.29* 0.71  CALCIUM 8.1* 8.3*   PT/INR  Basename 09/24/11 1718  LABPROT 13.1  INR 0.97   ABG No results found for this basename: PHART:2,PCO2:2,PO2:2,HCO3:2 in the last 72 hours  Studies/Results: Ir Perc Cholecystostomy  09/25/2011  *RADIOLOGY REPORT*  Clinical Data: Acute cholecystitis, nonoperative candidate  ULTRASOUND AND FLUOROSCOPIC TRANSHEPATIC PERCUTANEOUS CHOLECYSTOSTOMY  Date:  09/25/2011 13:45:00  Radiologist:  M. Ruel Favors, M.D.  Medications:  400 mg Cipro administered within 1 hour of the procedure, 1 mg Versed, 50 mcg Fentanyl  Guidance:  Ultrasound fluoroscopic  Fluoroscopy time:  0.8 minutes  Sedation time:  15 minutes  Contrast volume:  5 ml Omnipaque-300  Complications:  No immediate  PROCEDURE/FINDINGS:  Informed consent was obtained from the patient following explanation of the procedure, risks, benefits and alternatives. The patient understands, agrees and consents for the procedure. All questions were addressed.  A time out was performed.  Maximal barrier sterile technique utilized  including caps, mask, sterile gowns, sterile gloves, large sterile drape, hand hygiene, and betadine  The previous imaging was reviewed.  Acute cholecystitis evident by CT and ultrasound.  The patient was positioned supine.  Preliminary ultrasound performed of the right upper quadrant.  Abnormal gallbladder demonstrated.  Under sterile conditions and local anesthesia, a 20 gauge 15 cm access needle was advanced from a right intercostal transhepatic approach into the gallbladder.  Needle position confirmed with ultrasound.  Images obtained for documentation. Guide wire advanced followed by the Accustick dilator set.  This allowed exchange for an Amplatz guide wire.  Tract dilatation performed to advance a 10-French catheter with the retention loop formed in the gallbladder.  Syringe aspiration yielded 35 ml exudative bile.  Sample sent for Gram stain and culture.  Small injection performed to confirm position.  Diffuse filling defects noted compatible with cholelithiasis.  Images obtained for documentation.  Catheter secured with a Prolene suture and connected to external gravity drainage bag.  Sterile dressing applied to the site.  No immediate complication.  The patient tolerated the procedure well.  IMPRESSION:  Successful ultrasound and fluoroscopic percutaneous transhepatic 10- French cholecystostomy.  Original Report Authenticated By: Judie Petit. Ruel Favors, M.D.    Anti-infectives: Anti-infectives     Start     Dose/Rate Route Frequency Ordered Stop   09/27/11 2100   Levofloxacin (LEVAQUIN) IVPB 750 mg        750 mg 100 mL/hr over 90 Minutes Intravenous Every 48 hours  09/26/11 1040 10/03/11 2059   09/25/11 1430   ciprofloxacin (CIPRO) IVPB 400 mg        400 mg 200 mL/hr over 60 Minutes Intravenous  Once 09/25/11 1417 09/25/11 1520   09/23/11 2030   Levofloxacin (LEVAQUIN) IVPB 750 mg  Status:  Discontinued        750 mg 100 mL/hr over 90 Minutes Intravenous Every 24 hours 09/23/11 2018 09/26/11 1040            Assessment/Plan: s/p percutaneous cholecystostomy drain placed 2/28 Draining well Pt better Will follow few days Drain needs to stay in place 6-8 weeks unless goes to OR   Sydney [name] A 09/26/2011

## 2011-09-26 NOTE — Progress Notes (Addendum)
PCP: Ginette Otto, MD, MD  Brief HPI:  This is a 77 year old female with known history of Huntington's disease. Approximately 3 weeks ago she developed nausea and vomiting and dizziness. It has been intermittent. She was diagnosed with a UTI and treated with 10 days of Cipro. There was a question of aspiration with her emesis and her Huntington's disease. As stated her nausea and vomiting has been intermittent, however, last night it came back very severe. It is bilious in nature. Patient has no diarrhea. She does complain of stomach pain generalized. There is report of subjective fevers and chills. She was prescribed to meclizine, Zofran and Phenergan suppositories by her PCP. These are not controlling her symptoms. History was provided by the patient and her power of attorney Eugenio Hoes [phone].  Past medical history:  Past Medical History   Diagnosis  Date   .  Huntington disease    .  Atrial fibrillation    .  Chronic respiratory failure      nocturnal oxygen   .  Complication of anesthesia      felt surgery with epidural    Past Surgical History   Procedure  Date   .  Colon surgery    .  Abdominal hysterectomy    .  Hemorrhoid surgery    .  Tonsillectomy    .  Appendectomy     Consultants: CCS and IR  Procedures: Cholecystostomy tube placement 2/28  Subjective: Patient confused overnight. Denies pain. Per POA she has not complained of pain. Confused after she received narcotics.  Objective: Vital signs in last 24 hours: Temp:  [96.8 F (36 C)-98.8 F (37.1 C)] 98.3 F (36.8 C) (03/01 0455) Pulse Rate:  [71-89] 89  (03/01 0455) Resp:  [13-20] 18  (03/01 0455) BP: (98-166)/(62-83) 98/62 mmHg (03/01 0455) SpO2:  [90 %-99 %] 93 % (03/01 0455) Weight change:  Last BM Date: 09/23/11  Intake/Output from previous day: 02/28 0701 - 03/01 0700 In: 3143 [I.V.:3143] Out: 860 [Urine:800; Drains:60] Intake/Output this shift:    General appearance: alert,  cooperative and no distress  Resp: clear to auscultation bilaterally  Cardio: regular rate and rhythm, S1, S2 normal, no murmur, click, rub or gallop  Abdomen: Soft, non tender, non distended, BS present, drain from RUQ Neurologic: Grossly normal. Has huntington chorea.   Lab Results:  Basename 09/26/11 0330 09/25/11 0347  WBC 10.4 9.0  HGB 12.1 12.2  HCT 38.3 38.1  PLT 343 330   BMET  Basename 09/26/11 0330 09/25/11 0347  NA 138 136  K 5.0 3.8  CL 105 103  CO2 28 27  GLUCOSE 92 94  BUN 10 7  CREATININE 1.29* 0.71  CALCIUM 8.1* 8.3*  ALT 7 8    Studies/Results: Ir Perc Cholecystostomy  09/25/2011  *RADIOLOGY REPORT*  Clinical Data: Acute cholecystitis, nonoperative candidate  ULTRASOUND AND FLUOROSCOPIC TRANSHEPATIC PERCUTANEOUS CHOLECYSTOSTOMY  Date:  09/25/2011 13:45:00  Radiologist:  M. Ruel Favors, M.D.  Medications:  400 mg Cipro administered within 1 hour of the procedure, 1 mg Versed, 50 mcg Fentanyl  Guidance:  Ultrasound fluoroscopic  Fluoroscopy time:  0.8 minutes  Sedation time:  15 minutes  Contrast volume:  5 ml Omnipaque-300  Complications:  No immediate  PROCEDURE/FINDINGS:  Informed consent was obtained from the patient following explanation of the procedure, risks, benefits and alternatives. The patient understands, agrees and consents for the procedure. All questions were addressed.  A time out was performed.  Maximal barrier sterile technique utilized  including caps, mask, sterile gowns, sterile gloves, large sterile drape, hand hygiene, and betadine  The previous imaging was reviewed.  Acute cholecystitis evident by CT and ultrasound.  The patient was positioned supine.  Preliminary ultrasound performed of the right upper quadrant.  Abnormal gallbladder demonstrated.  Under sterile conditions and local anesthesia, a 20 gauge 15 cm access needle was advanced from a right intercostal transhepatic approach into the gallbladder.  Needle position confirmed with  ultrasound.  Images obtained for documentation. Guide wire advanced followed by the Accustick dilator set.  This allowed exchange for an Amplatz guide wire.  Tract dilatation performed to advance a 10-French catheter with the retention loop formed in the gallbladder.  Syringe aspiration yielded 35 ml exudative bile.  Sample sent for Gram stain and culture.  Small injection performed to confirm position.  Diffuse filling defects noted compatible with cholelithiasis.  Images obtained for documentation.  Catheter secured with a Prolene suture and connected to external gravity drainage bag.  Sterile dressing applied to the site.  No immediate complication.  The patient tolerated the procedure well.  IMPRESSION:  Successful ultrasound and fluoroscopic percutaneous transhepatic 10- French cholecystostomy.  Original Report Authenticated By: Judie Petit. Ruel Favors, M.D.    Medications:  Scheduled:    . aspirin EC  81 mg Oral Daily  . ciprofloxacin  400 mg Intravenous Once  . clonazePAM  1 mg Oral QHS  . digoxin  250 mcg Oral Daily  . enoxaparin  40 mg Subcutaneous QHS  . estrogens (conjugated)  0.625 mg Oral Daily  . feeding supplement  237 mL Oral TID WC  . FLUoxetine  20 mg Oral Daily  . haloperidol  0.5 mg Oral BID  . levofloxacin (LEVAQUIN) IV  750 mg Intravenous Q24H  . levothyroxine  50 mcg Oral Daily  . pantoprazole  40 mg Oral Daily  . scopolamine  1 patch Transdermal Q72H  . senna-docusate  2 tablet Oral BID    Assessment/Plan:  Principal Problem:  *Cholecystitis with cholelithiasis Active Problems:  Aspiration pneumonia  Huntington's disease  Atrial fibrillation  Vertigo    1. Cholecystitis with Cholelithiasis: Status post cholecystostomy tube placement by IR. Surgery is following as well. Continue antibiotics. Change to PO in a day or so.  2. Dizzines: Likely due to Vertigo. Meclizine. Stable.  3. Atrial Fibrillation: rate controlled. Continue Digoxin. Not on anticoagulation.    4. H/O Huntington's Disease: Under hospice care.   5. Aspiration Pneumonia: Stable on antibiotics. Monitor closely. Swallow evaluation. Hold off on new antibiotics for now.  6. DNR   7. Elevated Creatinine and high normal Potassium level: Increase IVF. Recheck labs in AM. Discontinue Potassium in fluids.  Disposition: May need SNF if roommate/POA is unable to care for her, especially with the cholecystostomy tube. Will get PT evaluation. Anticipate discharge in 2-3 days.    LOS: 3 days   [facility] Pager [phone] 09/26/2011, 9:52 AM

## 2011-09-27 ENCOUNTER — Inpatient Hospital Stay (HOSPITAL_COMMUNITY)

## 2011-09-27 LAB — CBC
HCT: 35.5 % — ABNORMAL LOW (ref 36.0–46.0)
MCH: 29.6 pg (ref 26.0–34.0)
MCHC: 31.8 g/dL (ref 30.0–36.0)
MCV: 92.9 fL (ref 78.0–100.0)
Platelets: 275 10*3/uL (ref 150–400)
RDW: 13.7 % (ref 11.5–15.5)
WBC: 8.8 10*3/uL (ref 4.0–10.5)

## 2011-09-27 LAB — BASIC METABOLIC PANEL
BUN: 10 mg/dL (ref 6–23)
Calcium: 8.5 mg/dL (ref 8.4–10.5)
Creatinine, Ser: 0.82 mg/dL (ref 0.50–1.10)
GFR calc Af Amer: 79 mL/min — ABNORMAL LOW (ref >90)
GFR calc non Af Amer: 68 mL/min — ABNORMAL LOW (ref >90)

## 2011-09-27 MED ORDER — BISACODYL 10 MG RE SUPP: 10.0000 mg | Freq: Once | RECTAL | Status: DC

## 2011-09-27 NOTE — Progress Notes (Signed)
Related admission, cholecystitis.  DNR on chart.  Found patient awake and eating her breakfast.  Patient denies pain or any swallowing difficulties.  No family present.  Reviewed chart and found patient tolerating cholecystomy tube well and continues to receive antibiotics.  Continue symptom management and encourage a call to Hospice at [phone] with any needs.  Charlott Holler, RN, BSN Hospice

## 2011-09-27 NOTE — Progress Notes (Signed)
Subjective: A little disoriented.  Denies abdominal pain.  Objective: Vital signs in last 24 hours: Temp:  [97.9 F (36.6 C)-98.5 F (36.9 C)] 98.5 F (36.9 C) (03/02 0516) Pulse Rate:  [70-74] 72  (03/02 0516) Resp:  [16-18] 16  (03/02 0516) BP: (119-124)/(71-80) 119/71 mmHg (03/02 0516) SpO2:  [90 %-92 %] 90 % (03/02 0516) Last BM Date: 09/23/11  Intake/Output from previous day: 03/01 0701 - 03/02 0700 In: 2487.5 [I.V.:2487.5] Out: 1000 [Urine:1000] Intake/Output this shift:    PE: Abd-soft, nontender, RUQ cholecystostomy tube draining yellowish fluid  Lab Results:   Basename 09/27/11 0336 09/26/11 0330  WBC 8.8 10.4  HGB 11.3* 12.1  HCT 35.5* 38.3  PLT 275 343   BMET  Basename 09/27/11 0336 09/26/11 0330  NA 137 138  K 3.9 5.0  CL 107 105  CO2 26 28  GLUCOSE 97 92  BUN 10 10  CREATININE 0.82 1.29*  CALCIUM 8.5 8.1*   PT/INR  Basename 09/24/11 1718  LABPROT 13.1  INR 0.97   Comprehensive Metabolic Panel:    Component Value Date/Time   NA 137 09/27/2011 0336   K 3.9 09/27/2011 0336   CL 107 09/27/2011 0336   CO2 26 09/27/2011 0336   BUN 10 09/27/2011 0336   CREATININE 0.82 09/27/2011 0336   GLUCOSE 97 09/27/2011 0336   CALCIUM 8.5 09/27/2011 0336   AST 9 09/26/2011 0330   ALT 7 09/26/2011 0330   ALKPHOS 99 09/26/2011 0330   BILITOT 0.3 09/26/2011 0330   PROT 6.1 09/26/2011 0330   ALBUMIN 2.6* 09/26/2011 0330     Studies/Results: Ir Perc Cholecystostomy  09/25/2011  *RADIOLOGY REPORT*  Clinical Data: Acute cholecystitis, nonoperative candidate  ULTRASOUND AND FLUOROSCOPIC TRANSHEPATIC PERCUTANEOUS CHOLECYSTOSTOMY  Date:  09/25/2011 13:45:00  Radiologist:  M. Ruel Favors, M.D.  Medications:  400 mg Cipro administered within 1 hour of the procedure, 1 mg Versed, 50 mcg Fentanyl  Guidance:  Ultrasound fluoroscopic  Fluoroscopy time:  0.8 minutes  Sedation time:  15 minutes  Contrast volume:  5 ml Omnipaque-300  Complications:  No immediate  PROCEDURE/FINDINGS:  Informed  consent was obtained from the patient following explanation of the procedure, risks, benefits and alternatives. The patient understands, agrees and consents for the procedure. All questions were addressed.  A time out was performed.  Maximal barrier sterile technique utilized including caps, mask, sterile gowns, sterile gloves, large sterile drape, hand hygiene, and betadine  The previous imaging was reviewed.  Acute cholecystitis evident by CT and ultrasound.  The patient was positioned supine.  Preliminary ultrasound performed of the right upper quadrant.  Abnormal gallbladder demonstrated.  Under sterile conditions and local anesthesia, a 20 gauge 15 cm access needle was advanced from a right intercostal transhepatic approach into the gallbladder.  Needle position confirmed with ultrasound.  Images obtained for documentation. Guide wire advanced followed by the Accustick dilator set.  This allowed exchange for an Amplatz guide wire.  Tract dilatation performed to advance a 10-French catheter with the retention loop formed in the gallbladder.  Syringe aspiration yielded 35 ml exudative bile.  Sample sent for Gram stain and culture.  Small injection performed to confirm position.  Diffuse filling defects noted compatible with cholelithiasis.  Images obtained for documentation.  Catheter secured with a Prolene suture and connected to external gravity drainage bag.  Sterile dressing applied to the site.  No immediate complication.  The patient tolerated the procedure well.  IMPRESSION:  Successful ultrasound and fluoroscopic percutaneous transhepatic 10- [place]  cholecystostomy.  Original Report Authenticated By: Judie Petit. Ruel Favors, M.D.    Anti-infectives: Anti-infectives     Start     Dose/Rate Route Frequency Ordered Stop   09/27/11 2100   Levofloxacin (LEVAQUIN) IVPB 750 mg        750 mg 100 mL/hr over 90 Minutes Intravenous Every 48 hours 09/26/11 1040 10/03/11 2059   09/25/11 1430   ciprofloxacin (CIPRO)  IVPB 400 mg        400 mg 200 mL/hr over 60 Minutes Intravenous  Once 09/25/11 1417 09/25/11 1520   09/23/11 2030   Levofloxacin (LEVAQUIN) IVPB 750 mg  Status:  Discontinued        750 mg 100 mL/hr over 90 Minutes Intravenous Every 24 hours 09/23/11 2018 09/26/11 1040          Assessment Principal Problem:  *Cholecystitis with cholelithiasis-s/p cholecystostomy tube placement; improving Active Problems:  Aspiration pneumonia  Huntington's disease  Atrial fibrillation  Vertigo    LOS: 4 days   Plan: Leave cholecystostomy tube in and continue abxs.   Sydney [name] 09/27/2011

## 2011-09-27 NOTE — Progress Notes (Signed)
PCP: Ginette Otto, MD, MD  Brief HPI:  This is a 77 year old female with known history of Huntington's disease. Approximately 3 weeks ago she developed nausea and vomiting and dizziness. It has been intermittent. She was diagnosed with a UTI and treated with 10 days of Cipro. There was a question of aspiration with her emesis and her Huntington's disease. As stated her nausea and vomiting has been intermittent, however, last night it came back very severe. It is bilious in nature. Patient has no diarrhea. She does complain of stomach pain generalized. There is report of subjective fevers and chills. She was prescribed to meclizine, Zofran and Phenergan suppositories by her PCP. These are not controlling her symptoms. History was provided by the patient and her power of attorney Eugenio Hoes [phone].  Past medical history:  Past Medical History   Diagnosis  Date   .  Huntington disease    .  Atrial fibrillation    .  Chronic respiratory failure      nocturnal oxygen   .  Complication of anesthesia      felt surgery with epidural    Past Surgical History   Procedure  Date   .  Colon surgery    .  Abdominal hysterectomy    .  Hemorrhoid surgery    .  Tonsillectomy    .  Appendectomy     Consultants: CCS and IR  Procedures: Cholecystostomy tube placement 2/28  Subjective: Patient's confusion seems to be better. Complaining of pain in abdomen. POA also mentions swollen left arm.  Objective: Vital signs in last 24 hours: Temp:  [98.3 F (36.8 C)-98.5 F (36.9 C)] 98.3 F (36.8 C) (03/02 1402) Pulse Rate:  [72-78] 78  (03/02 1402) Resp:  [16-18] 18  (03/02 1402) BP: (119-123)/(69-74) 120/69 mmHg (03/02 1402) SpO2:  [90 %-91 %] 91 % (03/02 1402) Weight change:  Last BM Date: 09/23/11  Intake/Output from previous day: 03/01 0701 - 03/02 0700 In: 2487.5 [I.V.:2487.5] Out: 1000 [Urine:1000] Intake/Output this shift: Total I/O In: 160 [P.O.:160] Out: 750  [Urine:750]  General appearance: alert, cooperative and no distress  Resp: clear to auscultation bilaterally  Cardio: regular rate and rhythm, S1, S2 normal, no murmur, click, rub or gallop  Abdomen: Distended. Tender. No rebound rigidity. BS sluggish. Neurologic: Grossly normal. Has huntington chorea.  Left arm slightly more swollen around IV site.  Lab Results:  Basename 09/27/11 0336 09/26/11 0330  WBC 8.8 10.4  HGB 11.3* 12.1  HCT 35.5* 38.3  PLT 275 343   BMET  Basename 09/27/11 0336 09/26/11 0330 09/25/11 0347  NA 137 138 --  K 3.9 5.0 --  CL 107 105 --  CO2 26 28 --  GLUCOSE 97 92 --  BUN 10 10 --  CREATININE 0.82 1.29* --  CALCIUM 8.5 8.1* --  ALT -- 7 8    Studies/Results: Ir Perc Cholecystostomy  09/25/2011  *RADIOLOGY REPORT*  Clinical Data: Acute cholecystitis, nonoperative candidate  ULTRASOUND AND FLUOROSCOPIC TRANSHEPATIC PERCUTANEOUS CHOLECYSTOSTOMY  Date:  09/25/2011 13:45:00  Radiologist:  M. Ruel Favors, M.D.  Medications:  400 mg Cipro administered within 1 hour of the procedure, 1 mg Versed, 50 mcg Fentanyl  Guidance:  Ultrasound fluoroscopic  Fluoroscopy time:  0.8 minutes  Sedation time:  15 minutes  Contrast volume:  5 ml Omnipaque-300  Complications:  No immediate  PROCEDURE/FINDINGS:  Informed consent was obtained from the patient following explanation of the procedure, risks, benefits and alternatives. The patient understands, agrees and  consents for the procedure. All questions were addressed.  A time out was performed.  Maximal barrier sterile technique utilized including caps, mask, sterile gowns, sterile gloves, large sterile drape, hand hygiene, and betadine  The previous imaging was reviewed.  Acute cholecystitis evident by CT and ultrasound.  The patient was positioned supine.  Preliminary ultrasound performed of the right upper quadrant.  Abnormal gallbladder demonstrated.  Under sterile conditions and local anesthesia, a 20 gauge 15 cm access  needle was advanced from a right intercostal transhepatic approach into the gallbladder.  Needle position confirmed with ultrasound.  Images obtained for documentation. Guide wire advanced followed by the Accustick dilator set.  This allowed exchange for an Amplatz guide wire.  Tract dilatation performed to advance a 10-French catheter with the retention loop formed in the gallbladder.  Syringe aspiration yielded 35 ml exudative bile.  Sample sent for Gram stain and culture.  Small injection performed to confirm position.  Diffuse filling defects noted compatible with cholelithiasis.  Images obtained for documentation.  Catheter secured with a Prolene suture and connected to external gravity drainage bag.  Sterile dressing applied to the site.  No immediate complication.  The patient tolerated the procedure well.  IMPRESSION:  Successful ultrasound and fluoroscopic percutaneous transhepatic 10- French cholecystostomy.  Original Report Authenticated By: Judie Petit. Ruel Favors, M.D.    Medications:  Scheduled:    . aspirin EC  81 mg Oral Daily  . clonazePAM  1 mg Oral QHS  . digoxin  250 mcg Oral Daily  . enoxaparin  40 mg Subcutaneous QHS  . estrogens (conjugated)  0.625 mg Oral Daily  . feeding supplement  237 mL Oral TID WC  . FLUoxetine  20 mg Oral Daily  . haloperidol  0.5 mg Oral BID  . levofloxacin (LEVAQUIN) IV  750 mg Intravenous Q48H  . levothyroxine  50 mcg Oral Daily  . pantoprazole  40 mg Oral Daily  . scopolamine  1 patch Transdermal Q72H  . senna-docusate  2 tablet Oral BID    Assessment/Plan:  Principal Problem:  *Cholecystitis with cholelithiasis Active Problems:  Aspiration pneumonia  Huntington's disease  Atrial fibrillation  Vertigo    1. Cholecystitis with Cholelithiasis: Status post cholecystostomy tube placement by IR. Surgery is following as well. Continue antibiotics. Change to PO in a day or so.  2. Abdominal Pain and Distension: Check AXR.  3. Dizzines: Likely  due to Vertigo. Meclizine. Stable.  4. Atrial Fibrillation: rate controlled. Continue Digoxin. Not on anticoagulation.   5. H/O Huntington's Disease: Under hospice care.   6. Aspiration Pneumonia: Stable on antibiotics. Monitor closely. Swallow evaluation. Hold off on new antibiotics for now.  7. DNR   8. Elevated Creatinine and high normal Potassium level: Improved with IVF   Disposition: POA wants to take her home if [facility] place is unable to accommodate her. Anticipate discharge in 1-2 days.  Will have RN evaluate left arm.   LOS: 4 days   [facility] Pager [phone] 09/27/2011, 2:27 PM

## 2011-09-28 LAB — BASIC METABOLIC PANEL
BUN: 7 mg/dL (ref 6–23)
Calcium: 8.3 mg/dL — ABNORMAL LOW (ref 8.4–10.5)
Creatinine, Ser: 0.8 mg/dL (ref 0.50–1.10)
GFR calc non Af Amer: 70 mL/min — ABNORMAL LOW (ref >90)
Glucose, Bld: 91 mg/dL (ref 70–99)

## 2011-09-28 LAB — CULTURE, ROUTINE-ABSCESS: Culture: NO GROWTH

## 2011-09-28 MED ORDER — HYDROCORTISONE 1 % EX CREA
TOPICAL_CREAM | Freq: Two times a day (BID) | CUTANEOUS | Status: DC
  Administered 2011-09-29 – 2011-09-30 (×3): via TOPICAL
  Administered 2011-09-30: 1 via TOPICAL
  Administered 2011-10-01 – 2011-10-02 (×3): via TOPICAL
  Filled 2011-09-28: qty 28

## 2011-09-28 MED ORDER — BISACODYL 10 MG RE SUPP
10.0000 mg | Freq: Once | RECTAL | Status: AC
  Administered 2011-09-28: 10 mg via RECTAL
  Filled 2011-09-28: qty 1

## 2011-09-28 MED ORDER — LEVOFLOXACIN IN D5W 750 MG/150ML IV SOLN
750.0000 mg | INTRAVENOUS | Status: DC
  Administered 2011-09-28: 750 mg via INTRAVENOUS
  Filled 2011-09-28 (×2): qty 150

## 2011-09-28 MED ORDER — POLYETHYLENE GLYCOL 3350 17 G PO PACK
17.0000 g | PACK | Freq: Every day | ORAL | Status: DC
  Administered 2011-09-28 – 2011-09-30 (×3): 17 g via ORAL
  Filled 2011-09-28 (×4): qty 1

## 2011-09-28 MED ORDER — HALOPERIDOL LACTATE 5 MG/ML IJ SOLN
2.0000 mg | Freq: Four times a day (QID) | INTRAMUSCULAR | Status: DC | PRN
  Administered 2011-09-29: 2 mg via INTRAVENOUS
  Filled 2011-09-28: qty 1

## 2011-09-28 NOTE — Progress Notes (Addendum)
Room 1301                                                                    [facility] RN note  Pt is alert but seems confused, denies pain. Reveiwed chart and find an expected los of 2-3 more days, with BP consideration per family desire. Pt remians on IV antibiotics. No family are present. DNR in place. VS:  135/82 94% on RA. Crit is 35.5, calcium 8.3

## 2011-09-28 NOTE — Progress Notes (Signed)
ANTIBIOTIC CONSULT NOTE - FOLLOW UP  Pharmacy Consult for  Levaquin Indication: acute cholecystitis (s/p perc drain), Aspiration PNA  Allergies  Allergen Reactions  . Codeine Itching  . Penicillins Cross Reactors Hives    Patient Measurements: Height: 5\' 6"  (167.6 cm) Weight: 127 lb (57.607 kg) IBW/kg (Calculated) : 59.3   Vital Signs: Temp: 97.7 F (36.5 C) (03/03 0620) Temp src: Oral (03/03 0620) BP: 138/82 mmHg (03/03 0620) Pulse Rate: 69  (03/03 0906)  Labs:  Basename 09/28/11 0344 09/27/11 0336 09/26/11 0330  WBC -- 8.8 10.4  HGB -- 11.3* 12.1  PLT -- 275 343  LABCREA -- -- --  CREATININE 0.80 0.82 1.29*   Estimated Creatinine Clearance: 55.3 ml/min (by C-G formula based on Cr of 0.8).    Medications: Anti-infectives     Start     Dose/Rate Route Frequency Ordered Stop   09/27/11 2100   Levofloxacin (LEVAQUIN) IVPB 750 mg        750 mg 100 mL/hr over 90 Minutes Intravenous Every 48 hours 09/26/11 1040 10/03/11 2059   09/25/11 1430   ciprofloxacin (CIPRO) IVPB 400 mg        400 mg 200 mL/hr over 60 Minutes Intravenous  Once 09/25/11 1417 09/25/11 1520   09/23/11 2030   Levofloxacin (LEVAQUIN) IVPB 750 mg  Status:  Discontinued        750 mg 100 mL/hr over 90 Minutes Intravenous Every 24 hours 09/23/11 2018 09/26/11 1040          Assessment:  77 YOF on Day # 6/8 IV Levaquin for aspiration PNA/acute choleystitis  Had perc chole drain placed in IR 2/28, 60cc drained so far. Drain cx NGTD. Bcx x 2 NGTD. Afebrile.  Scr has decreased and CrCl ~ 55 ml/min   For aspiration pneumonia, Noted physician plan to continue Levaquin and hold off on new antibiotics  Goal of Therapy:  Appropriate renal dose of Levaquin  Plan:   Change Levaquin to 750mg  IV Q 24 hours  Continue to monitor and adjust dose as necessary.   Lynann Beaver PharmD   Pager [phone] 09/28/2011 1:01 PM

## 2011-09-28 NOTE — Progress Notes (Signed)
Patient given scheduled suppository. Patient did not have a formed bowel movement, just minimal mucous. Patient does have flatus. Will continue to monitor progress throughout shift.

## 2011-09-28 NOTE — Progress Notes (Signed)
  Subjective: Denies RUQ pain.  Objective: Vital signs in last 24 hours: Temp:  [97.7 F (36.5 C)-98.4 F (36.9 C)] 97.7 F (36.5 C) (03/03 0620) Pulse Rate:  [64-78] 64  (03/03 0620) Resp:  [17-18] 17  (03/03 0620) BP: (120-138)/(69-82) 138/82 mmHg (03/03 0620) SpO2:  [91 %-95 %] 94 % (03/03 0620) Last BM Date: 09/27/11  Intake/Output from previous day: 03/02 0701 - 03/03 0700 In: 1810 [P.O.:740; I.V.:1070] Out: 3090 [Urine:2975; Drains:115] Intake/Output this shift:    PE: Abd-soft, nontender, drain in RUQ with no purulent drainage  Lab Results:   Basename 09/27/11 0336 09/26/11 0330  WBC 8.8 10.4  HGB 11.3* 12.1  HCT 35.5* 38.3  PLT 275 343   BMET  Basename 09/28/11 0344 09/27/11 0336  NA 140 137  K 3.9 3.9  CL 105 107  CO2 30 26  GLUCOSE 91 97  BUN 7 10  CREATININE 0.80 0.82  CALCIUM 8.3* 8.5   PT/INR No results found for this basename: LABPROT:2,INR:2 in the last 72 hours Comprehensive Metabolic Panel:    Component Value Date/Time   NA 140 09/28/2011 0344   K 3.9 09/28/2011 0344   CL 105 09/28/2011 0344   CO2 30 09/28/2011 0344   BUN 7 09/28/2011 0344   CREATININE 0.80 09/28/2011 0344   GLUCOSE 91 09/28/2011 0344   CALCIUM 8.3* 09/28/2011 0344   AST 9 09/26/2011 0330   ALT 7 09/26/2011 0330   ALKPHOS 99 09/26/2011 0330   BILITOT 0.3 09/26/2011 0330   PROT 6.1 09/26/2011 0330   ALBUMIN 2.6* 09/26/2011 0330     Studies/Results: Dg Abd Portable 1v  09/27/2011  *RADIOLOGY REPORT*  Clinical Data: Abdominal pain and distension.  History of cholecystostomy tube.  PORTABLE ABDOMEN - 1 VIEW  Comparison:  CT 09/23/2011  Findings: Right upper quadrant cholecystostomy tube is in place. There is gaseous distension of the stomach and large bowel loops. Contrast is identified within the ascending colon and transverse colon.  Surgical clips are identified within the pelvis.  No evidence for free intraperitoneal air on this supine view.  No evidence for small bowel dilatation.   IMPRESSION: Findings are most consistent with colonic ileus.  No evidence for free air on this supine view.  Original Report Authenticated By: Patterson Hammersmith, M.D.    Anti-infectives: Anti-infectives     Start     Dose/Rate Route Frequency Ordered Stop   09/27/11 2100   Levofloxacin (LEVAQUIN) IVPB 750 mg        750 mg 100 mL/hr over 90 Minutes Intravenous Every 48 hours 09/26/11 1040 10/03/11 2059   09/25/11 1430   ciprofloxacin (CIPRO) IVPB 400 mg        400 mg 200 mL/hr over 60 Minutes Intravenous  Once 09/25/11 1417 09/25/11 1520   09/23/11 2030   Levofloxacin (LEVAQUIN) IVPB 750 mg  Status:  Discontinued        750 mg 100 mL/hr over 90 Minutes Intravenous Every 24 hours 09/23/11 2018 09/26/11 1040          Assessment Principal Problem:  *Cholecystitis with cholelithiasis-s/p cholecystostomy; improving from this standpoint Active Problems:  Aspiration pneumonia  Huntington's disease  Atrial fibrillation  Vertigo    LOS: 5 days   Plan: Leave cholecystostomy tube in indefinitely given her Hospice situation.  Can follow up with Dr. Andrey Campanile in our office after discharge.   Gwendlyn Hanback J 09/28/2011

## 2011-09-28 NOTE — Progress Notes (Signed)
Patient has blisters to right lower quadrant along tape line. No complaints of itching or pain to area.  Blisters raised, slight erythema, fluid filled, with some >1cm. Notified physician, to apply 1% Hydrocortisone to affected area and change tape. Will continue to monitor throughout shift.

## 2011-09-28 NOTE — Progress Notes (Signed)
PCP: Ginette Otto, MD, MD  Brief HPI:  This is a 77 year old female with known history of Huntington's disease. Approximately 3 weeks ago she developed nausea and vomiting and dizziness. It has been intermittent. She was diagnosed with a UTI and treated with 10 days of Cipro. There was a question of aspiration with her emesis and her Huntington's disease. As stated her nausea and vomiting has been intermittent, however, last night it came back very severe. It is bilious in nature. Patient has no diarrhea. She does complain of stomach pain generalized. There is report of subjective fevers and chills. She was prescribed to meclizine, Zofran and Phenergan suppositories by her PCP. These are not controlling her symptoms. History was provided by the patient and her power of attorney Eugenio Hoes [phone].  Past medical history:  Past Medical History   Diagnosis  Date   .  Huntington disease    .  Atrial fibrillation    .  Chronic respiratory failure      nocturnal oxygen   .  Complication of anesthesia      felt surgery with epidural    Past Surgical History   Procedure  Date   .  Colon surgery    .  Abdominal hysterectomy    .  Hemorrhoid surgery    .  Tonsillectomy    .  Appendectomy     Consultants: CCS and IR  Procedures: Cholecystostomy tube placement 2/28  Subjective: Patient's confusion seems to be better. Still with some pain in abdomen. Had a small BM last night. Distension persists. Denies nausea or vomiting. Tolerating food.  Objective: Vital signs in last 24 hours: Temp:  [97.7 F (36.5 C)-98.4 F (36.9 C)] 97.7 F (36.5 C) (03/03 0620) Pulse Rate:  [64-78] 69  (03/03 0906) Resp:  [17-18] 17  (03/03 0620) BP: (120-138)/(69-82) 138/82 mmHg (03/03 0620) SpO2:  [91 %-95 %] 94 % (03/03 0620) Weight change:  Last BM Date: 09/27/11  Intake/Output from previous day: 03/02 0701 - 03/03 0700 In: 1810 [P.O.:740; I.V.:1070] Out: 3090 [Urine:2975;  Drains:115] Intake/Output this shift: Total I/O In: 560 [P.O.:60; I.V.:500] Out: -   General appearance: alert, cooperative and no distress  Resp: clear to auscultation bilaterally  Cardio: regular rate and rhythm, S1, S2 normal, no murmur, click, rub or gallop  Abdomen: Distended but slightly better. Slightly Tender. No rebound rigidity. BS sluggish. Neurologic: Grossly normal. Has huntington chorea.   Lab Results:  Basename 09/27/11 0336 09/26/11 0330  WBC 8.8 10.4  HGB 11.3* 12.1  HCT 35.5* 38.3  PLT 275 343   BMET  Basename 09/28/11 0344 09/27/11 0336 09/26/11 0330  NA 140 137 --  K 3.9 3.9 --  CL 105 107 --  CO2 30 26 --  GLUCOSE 91 97 --  BUN 7 10 --  CREATININE 0.80 0.82 --  CALCIUM 8.3* 8.5 --  ALT -- -- 7    Studies/Results: Dg Abd Portable 1v  09/27/2011  *RADIOLOGY REPORT*  Clinical Data: Abdominal pain and distension.  History of cholecystostomy tube.  PORTABLE ABDOMEN - 1 VIEW  Comparison:  CT 09/23/2011  Findings: Right upper quadrant cholecystostomy tube is in place. There is gaseous distension of the stomach and large bowel loops. Contrast is identified within the ascending colon and transverse colon.  Surgical clips are identified within the pelvis.  No evidence for free intraperitoneal air on this supine view.  No evidence for small bowel dilatation.  IMPRESSION: Findings are most consistent with colonic ileus.  No evidence for free air on this supine view.  Original Report Authenticated By: Patterson Hammersmith, M.D.    Medications:  Scheduled:    . aspirin EC  81 mg Oral Daily  . bisacodyl  10 mg Rectal Once  . clonazePAM  1 mg Oral QHS  . digoxin  250 mcg Oral Daily  . enoxaparin  40 mg Subcutaneous QHS  . estrogens (conjugated)  0.625 mg Oral Daily  . feeding supplement  237 mL Oral TID WC  . FLUoxetine  20 mg Oral Daily  . haloperidol  0.5 mg Oral BID  . levofloxacin (LEVAQUIN) IV  750 mg Intravenous Q24H  . levothyroxine  50 mcg Oral Daily  .  pantoprazole  40 mg Oral Daily  . polyethylene glycol  17 g Oral Daily  . scopolamine  1 patch Transdermal Q72H  . senna-docusate  2 tablet Oral BID  . DISCONTD: bisacodyl  10 mg Rectal Once  . DISCONTD: levofloxacin (LEVAQUIN) IV  750 mg Intravenous Q48H    Assessment/Plan:  Principal Problem:  *Cholecystitis with cholelithiasis Active Problems:  Aspiration pneumonia  Huntington's disease  Atrial fibrillation  Vertigo    1. Cholecystitis with Cholelithiasis: Status post cholecystostomy tube placement by IR. Surgery is following as well. Continue antibiotics. Change to PO once ileus resolves.  2. Colonic Ileus: No nausea or vomiting. Try suppositories, laxatives.  3. Dizzines: Likely due to Vertigo. Meclizine. Stable.  4. Atrial Fibrillation: rate controlled. Continue Digoxin. Not on anticoagulation.   5. H/O Huntington's Disease: Under hospice care.   6. Aspiration Pneumonia: Stable on antibiotics. Monitor closely. Swallow evaluation done. Hold off on new antibiotics for now.  7. DNR   8. Elevated Creatinine and high normal Potassium level: Improved with IVF   IV removed from left arm.  Disposition: POA wants to take her home if [facility] is unable to accommodate her. Anticipate discharge in 1-2 days.    LOS: 5 days   [facility] Pager [phone] 09/28/2011, 1:22 PM

## 2011-09-29 ENCOUNTER — Telehealth (INDEPENDENT_AMBULATORY_CARE_PROVIDER_SITE_OTHER): Payer: Self-pay

## 2011-09-29 LAB — COMPREHENSIVE METABOLIC PANEL
Albumin: 2.5 g/dL — ABNORMAL LOW (ref 3.5–5.2)
BUN: 5 mg/dL — ABNORMAL LOW (ref 6–23)
Creatinine, Ser: 0.71 mg/dL (ref 0.50–1.10)
GFR calc Af Amer: >90 mL/min (ref >90)
Glucose, Bld: 94 mg/dL (ref 70–99)
Total Protein: 5.8 g/dL — ABNORMAL LOW (ref 6.0–8.3)

## 2011-09-29 MED ORDER — HALOPERIDOL 0.5 MG PO TABS
0.5000 mg | ORAL_TABLET | Freq: Every day | ORAL | Status: DC
  Administered 2011-09-30 – 2011-10-02 (×3): 0.5 mg via ORAL
  Filled 2011-09-29 (×4): qty 1

## 2011-09-29 MED ORDER — POTASSIUM CHLORIDE 20 MEQ/15ML (10%) PO LIQD
40.0000 meq | Freq: Once | ORAL | Status: AC
  Administered 2011-09-29: 40 meq via ORAL
  Filled 2011-09-29: qty 30

## 2011-09-29 MED ORDER — QUETIAPINE 12.5 MG HALF TABLET
12.5000 mg | ORAL_TABLET | Freq: Every day | ORAL | Status: DC
  Administered 2011-09-29 – 2011-10-01 (×3): 12.5 mg via ORAL
  Filled 2011-09-29 (×5): qty 1

## 2011-09-29 MED ORDER — BISACODYL 10 MG RE SUPP
10.0000 mg | Freq: Once | RECTAL | Status: AC
  Administered 2011-09-29: 10 mg via RECTAL
  Filled 2011-09-29: qty 1

## 2011-09-29 MED ORDER — LEVOFLOXACIN 750 MG PO TABS
750.0000 mg | ORAL_TABLET | Freq: Every day | ORAL | Status: DC
  Administered 2011-09-29 – 2011-10-02 (×4): 750 mg via ORAL
  Filled 2011-09-29 (×5): qty 1

## 2011-09-29 NOTE — Progress Notes (Signed)
[facility] Chaplain visit: Pt's POA Fannie Knee in conversation with physician, and pt having visit with [name] Kriste Basque and friend Bjorn Loser.  Pt awake in bed and "feeling better" but also impatient to have surgery to "get it out of me so i can go home" (refering to removing gall-bladder).  Fannie Knee emotional with tears on entering room, upset with what seemed to her to be unrealistic options for pt.  Pt and family thankful for spiritual support and prayer for wisdom and guidance and peace. Lovenia Shuck, [facility] Chaplain

## 2011-09-29 NOTE — Progress Notes (Signed)
PCP: Ginette Otto, MD, MD  Brief HPI:  This is a 77 year old female with known history of Huntington's disease. Approximately 3 weeks ago she developed nausea and vomiting and dizziness. It has been intermittent. She was diagnosed with a UTI and treated with 10 days of Cipro. There was a question of aspiration with her emesis and her Huntington's disease. As stated her nausea and vomiting has been intermittent, however, last night it came back very severe. It is bilious in nature. Patient has no diarrhea. She does complain of stomach pain generalized. There is report of subjective fevers and chills. She was prescribed to meclizine, Zofran and Phenergan suppositories by her PCP. These are not controlling her symptoms. History was provided by the patient and her power of attorney Eugenio Hoes [phone].  Past medical history:  Past Medical History   Diagnosis  Date   .  Huntington disease    .  Atrial fibrillation    .  Chronic respiratory failure      nocturnal oxygen   .  Complication of anesthesia      felt surgery with epidural    Past Surgical History   Procedure  Date   .  Colon surgery    .  Abdominal hysterectomy    .  Hemorrhoid surgery    .  Tonsillectomy    .  Appendectomy     Consultants: CCS and IR  Procedures: Cholecystostomy tube placement 2/28  Subjective: Patient's confusion is better. Pain is better but still present. Distension is better. Denies nausea or vomiting. Tolerating food.  Objective: Vital signs in last 24 hours: Temp:  [97.4 F (36.3 C)-98.2 F (36.8 C)] 97.4 F (36.3 C) (03/04 1339) Pulse Rate:  [72-92] 92  (03/04 1339) Resp:  [18] 18  (03/04 1339) BP: (134-140)/(77-85) 135/77 mmHg (03/04 1339) SpO2:  [93 %-94 %] 94 % (03/04 1339) Weight change:  Last BM Date: 09/27/11  Intake/Output from previous day: 03/03 0701 - 03/04 0700 In: 2253.8 [P.O.:430; I.V.:1673.8; IV Piggyback:150] Out: 4390 [Urine:4350; Drains:40] Intake/Output this  shift: Total I/O In: 480 [P.O.:480] Out: 1250 [Urine:1250]  General appearance: alert, cooperative and no distress  Resp: clear to auscultation bilaterally  Cardio: regular rate and rhythm, S1, S2 normal, no murmur, click, rub or gallop  Abdomen: Distended but slightly better. Slightly Tender. No rebound rigidity. BS sluggish. Neurologic: Grossly normal. Has huntington chorea.   Lab Results:  [facility] 09/27/11 0336  WBC 8.8  HGB 11.3*  HCT 35.5*  PLT 275   BMET  Basename 09/29/11 0330 09/28/11 0344  NA 137 140  K 3.5 3.9  CL 101 105  CO2 28 30  GLUCOSE 94 91  BUN 5* 7  CREATININE 0.71 0.80  CALCIUM 8.5 8.3*  ALT 7 --    Studies/Results: Dg Abd Portable 1v  09/27/2011  *RADIOLOGY REPORT*  Clinical Data: Abdominal pain and distension.  History of cholecystostomy tube.  PORTABLE ABDOMEN - 1 VIEW  Comparison:  CT 09/23/2011  Findings: Right upper quadrant cholecystostomy tube is in place. There is gaseous distension of the stomach and large bowel loops. Contrast is identified within the ascending colon and transverse colon.  Surgical clips are identified within the pelvis.  No evidence for free intraperitoneal air on this supine view.  No evidence for small bowel dilatation.  IMPRESSION: Findings are most consistent with colonic ileus.  No evidence for free air on this supine view.  Original Report Authenticated By: Patterson Hammersmith, M.D.    Medications:  Scheduled:    . aspirin EC  81 mg Oral Daily  . bisacodyl  10 mg Rectal Once  . bisacodyl  10 mg Rectal Once  . clonazePAM  1 mg Oral QHS  . digoxin  250 mcg Oral Daily  . enoxaparin  40 mg Subcutaneous QHS  . estrogens (conjugated)  0.625 mg Oral Daily  . feeding supplement  237 mL Oral TID WC  . FLUoxetine  20 mg Oral Daily  . haloperidol  0.5 mg Oral BID  . hydrocortisone cream   Topical BID  . levofloxacin (LEVAQUIN) IV  750 mg Intravenous Q24H  . levothyroxine  50 mcg Oral Daily  . pantoprazole  40 mg Oral  Daily  . polyethylene glycol  17 g Oral Daily  . scopolamine  1 patch Transdermal Q72H  . senna-docusate  2 tablet Oral BID    Assessment/Plan:  Principal Problem:  *Cholecystitis with cholelithiasis Active Problems:  Aspiration pneumonia  Huntington's disease  Atrial fibrillation  Vertigo    1. Cholecystitis with Cholelithiasis: Status post cholecystostomy tube placement by IR. Surgery is following as well. Continue antibiotics for 14 days total. Change to PO.  2. Colonic Ileus: Improved. Laxatives.  3. Vertigo: Meclizine. Stable.  4. Atrial Fibrillation: rate controlled. Continue Digoxin. Not on anticoagulation.   5. H/O Huntington's Disease: Under hospice care.   6. Aspiration Pneumonia: Stable on antibiotics. Monitor closely. Swallow evaluation done. Hold off on new antibiotics for now.  7. DNR   8. Elevated Creatinine and high normal Potassium level: Improved with IVF   IV removed from left arm.  Disposition: POA now requesting SNF. Hospice SW assisting with same. Hopefully discharge in AM.    LOS: 6 days   [facility] Pager [phone] 09/29/2011, 2:26 PM

## 2011-09-29 NOTE — Progress Notes (Addendum)
Room 1301  [name]   [facility]  [facility] RN Visit  Final clarification - this is a NON - Related admission to [facility] diagnosis of Huntingtons.   Pt is DNR code.    Pt alert & continues with confusion, sitting up in bed, with [facility] feeding breakfast to pt.  No complaints of pain or discomfort.  Hospital SW [place] discussed disposition re: BP -discussed with [facility] SW Debbie to confirm plans.  [facility] SW will see patient and PCG today to ensure PCG's comfort with disposition being home with continuing [facility] home care services.  Please call [facility] @ [phone] with any needs.  Thank you.  Joneen Boers, RN  [facility]  Hospice Liaison

## 2011-09-29 NOTE — Progress Notes (Signed)
Subjective: Patient doing well; no acute changes; has a few blisters along edge of tegaderm dressing at GB drain  Objective: Vital signs in last 24 hours: Temp:  [97.9 F (36.6 C)-99.7 F (37.6 C)] 97.9 F (36.6 C) (03/04 0400) Pulse Rate:  [72-84] 72  (03/04 0400) Resp:  [18] 18  (03/04 0400) BP: (134-140)/(78-85) 140/85 mmHg (03/04 0400) SpO2:  [93 %] 93 % (03/04 0400) Last BM Date: 09/27/11  Intake/Output from previous day: 03/03 0701 - 03/04 0700 In: 2253.8 [P.O.:430; I.V.:1673.8; IV Piggyback:150] Out: 4390 [Urine:4350; Drains:40] Intake/Output this shift:    GB drain intact, insertion site ok, line of blisters along lateral edge of tegaderm dressing (pt with adhesive allergy per POA); dressing removed- will replace with new gauze dressing and minimal paper tape, apply prn steroid cream/ointment; output 40 cc's today; drain flushed with 5 cc's sterile NS without difficulty, bile cx's negative; abd soft, mildly distended  Lab Results:   [facility] 09/27/11 0336  WBC 8.8  HGB 11.3*  HCT 35.5*  PLT 275   BMET  Basename 09/29/11 0330 09/28/11 0344  NA 137 140  K 3.5 3.9  CL 101 105  CO2 28 30  GLUCOSE 94 91  BUN 5* 7  CREATININE 0.71 0.80  CALCIUM 8.5 8.3*   PT/INR No results found for this basename: LABPROT:2,INR:2 in the last 72 hours ABG No results found for this basename: PHART:2,PCO2:2,PO2:2,HCO3:2 in the last 72 hours  Studies/Results: Dg Abd Portable 1v  09/27/2011  *RADIOLOGY REPORT*  Clinical Data: Abdominal pain and distension.  History of cholecystostomy tube.  PORTABLE ABDOMEN - 1 VIEW  Comparison:  CT 09/23/2011  Findings: Right upper quadrant cholecystostomy tube is in place. There is gaseous distension of the stomach and large bowel loops. Contrast is identified within the ascending colon and transverse colon.  Surgical clips are identified within the pelvis.  No evidence for free intraperitoneal air on this supine view.  No evidence for small bowel  dilatation.  IMPRESSION: Findings are most consistent with colonic ileus.  No evidence for free air on this supine view.  Original Report Authenticated By: Patterson Hammersmith, M.D.   Results for orders placed during the hospital encounter of 09/23/11  CULTURE, BLOOD (ROUTINE X 2)     Status: Normal (Preliminary result)   Collection Time   09/23/11  9:10 PM      Component Value Range Status Comment   Specimen Description BLOOD RIGHT ANTECUBITAL   Final    Special Requests BOTTLES DRAWN AEROBIC AND ANAEROBIC 10CC EACH   Final    Culture  Setup Time [id number]   Final    Culture     Final    Value:        BLOOD CULTURE RECEIVED NO GROWTH TO DATE CULTURE WILL BE HELD FOR 5 DAYS BEFORE ISSUING A FINAL NEGATIVE REPORT   Report Status PENDING   Incomplete   CULTURE, BLOOD (ROUTINE X 2)     Status: Normal (Preliminary result)   Collection Time   09/23/11  9:15 PM      Component Value Range Status Comment   Specimen Description BLOOD RIGHT ANTECUBITAL   Final    Special Requests BOTTLES DRAWN AEROBIC ONLY 4CC   Final    Culture  Setup Time [id number]   Final    Culture     Final    Value:        BLOOD CULTURE RECEIVED NO GROWTH TO DATE CULTURE WILL BE HELD FOR 5 DAYS  BEFORE ISSUING A FINAL NEGATIVE REPORT   Report Status PENDING   Incomplete   CULTURE, ROUTINE-ABSCESS     Status: Normal   Collection Time   09/25/11  2:46 PM      Component Value Range Status Comment   Specimen Description GALL BLADDER ASPIRATE   Final    Special Requests Normal   Final    Gram Stain     Final    Value: MODERATE WBC PRESENT,BOTH PMN AND MONONUCLEAR     NO SQUAMOUS EPITHELIAL CELLS SEEN     NO ORGANISMS SEEN   Culture NO GROWTH 3 DAYS   Final    Report Status 09/28/2011 FINAL   Final      Anti-infectives: Anti-infectives     Start     Dose/Rate Route Frequency Ordered Stop   09/28/11 2000   Levofloxacin (LEVAQUIN) IVPB 750 mg        750 mg 100 mL/hr over 90 Minutes Intravenous Every 24 hours 09/28/11  1310 10/01/11 1959   09/27/11 2100   Levofloxacin (LEVAQUIN) IVPB 750 mg  Status:  Discontinued        750 mg 100 mL/hr over 90 Minutes Intravenous Every 48 hours 09/26/11 1040 09/28/11 1310   09/25/11 1430   ciprofloxacin (CIPRO) IVPB 400 mg        400 mg 200 mL/hr over 60 Minutes Intravenous  Once 09/25/11 1417 09/25/11 1520   09/23/11 2030   Levofloxacin (LEVAQUIN) IVPB 750 mg  Status:  Discontinued        750 mg 100 mL/hr over 90 Minutes Intravenous Every 24 hours 09/23/11 2018 09/26/11 1040          Assessment/Plan: s/p percutaneous cholecystostomy 2/28; continue drain for at least 4-6 weeks unless cholecystectomy done in interim; flush drain daily with 5-10 cc's sterile NS as OP , record output and change dressing daily, short term corticosteroid ointment vs silvadene cream to blisters prn ; if patient needs long term drainage will require exchange in 6 weeks   LOS: 6 days    Sydney [name],D [facility] 09/29/2011

## 2011-09-29 NOTE — Progress Notes (Signed)
[facility] MSW note: This is a non related admission.  MSW met with patient(pt) and [name]-POA/caregiver. Fannie Knee was feeding pt lunch. Pt denied pain on visit. Fannie Knee stated that pt continues to have periods of confusion. MSW spoke with Chalmers Cater, RN Liason and Garth Bigness, Emergency planning/management officer at [employer]. According to them pt is not medically eligible for BP at this time. MSW explained this to [place]. MSW reviewed care options with Fannie Knee. MSW discussed home with continued [facility] services along with hired sitters. MSW also discussed option of SNF for some short term rehab. Fannie Knee to consider SNF. MSW to have Unice Bailey, hospice social worker follow up on possible snf. Fannie Knee wants to consider snf that are open first. MSW offered support and education.  Elijio Miles, MSW

## 2011-09-29 NOTE — Telephone Encounter (Signed)
Dr. Andrey Campanile paged to call Dr. Rito Ehrlich @ [facility] [phone].

## 2011-09-29 NOTE — Progress Notes (Signed)
Physical Therapy Treatment Patient Details Name: Sydney [name] MRN: [id number] DOB: 1935/06/04 Today's Date: 09/29/2011  PT Assessment/Plan  PT - Assessment/Plan Comments on Treatment Session: Patient tolerated treatment and able to be managed with +1 assist.  Feel she can be managed at home with the help of her caregiver and Hospice services.  Spoke at length with caregiver regarding her concers over the drain being left in and now feels she needs to have her gallbladder out. PT Plan: Discharge plan remains appropriate PT Frequency: Min 3X/week Follow Up Recommendations: No PT follow up Equipment Recommended: None recommended by PT PT Goals  Acute Rehab PT Goals Pt will go Supine/Side to Sit: with mod assist PT Goal: Supine/Side to Sit - Progress: Progressing toward goal Pt will go Sit to Supine/Side: with mod assist PT Goal: Sit to Supine/Side - Progress: Progressing toward goal Pt will Transfer Bed to Chair/Chair to Bed: with mod assist PT Transfer Goal: Bed to Chair/Chair to Bed - Progress: Progressing toward goal  PT Treatment Precautions/Restrictions  Precautions Precautions: Fall Precaution Comments: esp due to Huntingtons with uncontrolled movements Restrictions Weight Bearing Restrictions: No Mobility (including Balance) Bed Mobility Supine to Sit: 2: Max assist Supine to Sit Details (indicate cue type and reason): assist for legs over bed and to lift trunk Sitting - Scoot to Edge of Bed: 3: Mod assist Transfers Sit to Stand: 3: Mod assist;With upper extremity assist Sit to Stand Details (indicate cue type and reason): pulling up on PT or her caregiver or on wall rail in hallway (total x 6 reps) Stand to Sit: 3: Mod assist;To chair/3-in-1;To bed Stand to Sit Details: cues to control lowering Stand Pivot Transfers: 2: Max assist Stand Pivot Transfer Details (indicate cue type and reason): bed to w/c with PT assist for controlling movements to turn to chair; from w/c to  bed with caregiver assist; from bed to recliner PT assist Wheelchair Mobility Wheelchair Mobility: Yes Wheelchair Assistance: 3: Mod assist Wheelchair Assistance Details (indicate cue type and reason): mod/max cues to continue; assist to steer around obstacles and keep in correct path Wheelchair Propulsion: Both lower extermities (pushing in reverse for LE strength) Wheelchair Parts Management: Needs assistance Distance: 150'  Posture/Postural Control Posture/Postural Control: Postural limitations Postural Limitations: due to Huntingtons Balance Balance Assessed: Yes Static Sitting Balance Static Sitting - Balance Support: Bilateral upper extremity supported Static Sitting - Level of Assistance: 4: Min assist Static Sitting - Comment/# of Minutes: for safety due to Huntington's; tends to have posterior LOB Exercise    End of Session PT - End of Session Equipment Utilized During Treatment: Gait belt Activity Tolerance: Patient limited by fatigue Patient left: in chair;with call bell in reach;with family/visitor present General Behavior During Session: [facility] for tasks performed Cognition: Impaired, at baseline  [facility] 09/29/2011, 4:26 PM

## 2011-09-29 NOTE — Progress Notes (Signed)
Subjective: Pt ok. No new c/o. Passing some flatus and had small BM Less bloated, no N/V  Objective: Vital signs in last 24 hours: Temp:  [97.9 F (36.6 C)-99.7 F (37.6 C)] 97.9 F (36.6 C) (03/04 0400) Pulse Rate:  [72-84] 72  (03/04 0400) Resp:  [18] 18  (03/04 0400) BP: (134-140)/(78-85) 140/85 mmHg (03/04 0400) SpO2:  [93 %] 93 % (03/04 0400) Last BM Date: 09/27/11  Intake/Output this shift: Total I/O In: 240 [P.O.:240] Out: 650 [Urine:650]  Physical Exam: BP 140/85  Pulse 72  Temp(Src) 97.9 F (36.6 C) (Oral)  Resp 18  Ht 5\' 6"  (1.676 m)  Wt 57.607 kg (127 lb)  BMI 20.50 kg/m2  SpO2 93% Abdomen: mildly distended but soft. NT Drain intact, thin serous output, but just flushed.  Labs: CBC  Basename 09/27/11 0336  WBC 8.8  HGB 11.3*  HCT 35.5*  PLT 275   BMET  Basename 09/29/11 0330 09/28/11 0344  NA 137 140  K 3.5 3.9  CL 101 105  CO2 28 30  GLUCOSE 94 91  BUN 5* 7  CREATININE 0.71 0.80  CALCIUM 8.5 8.3*   LFT  Basename 09/29/11 0330  PROT 5.8*  ALBUMIN 2.5*  AST 9  ALT 7  ALKPHOS 82  BILITOT 0.3  BILIDIR --  IBILI --  LIPASE --   PT/INR No results found for this basename: LABPROT:2,INR:2 in the last 72 hours ABG No results found for this basename: PHART:2,PCO2:2,PO2:2,HCO3:2 in the last 72 hours  Studies/Results: Dg Abd Portable 1v  09/27/2011  *RADIOLOGY REPORT*  Clinical Data: Abdominal pain and distension.  History of cholecystostomy tube.  PORTABLE ABDOMEN - 1 VIEW  Comparison:  CT 09/23/2011  Findings: Right upper quadrant cholecystostomy tube is in place. There is gaseous distension of the stomach and large bowel loops. Contrast is identified within the ascending colon and transverse colon.  Surgical clips are identified within the pelvis.  No evidence for free intraperitoneal air on this supine view.  No evidence for small bowel dilatation.  IMPRESSION: Findings are most consistent with colonic ileus.  No evidence for free air  on this supine view.  Original Report Authenticated By: Patterson Hammersmith, M.D.    Assessment: Principal Problem:  *Cholecystitis with cholelithiasis Active Problems:  Aspiration pneumonia  Huntington's disease  Atrial fibrillation  Vertigo     Plan: Will offer suppository to aid bowel function. Leave cholecystostomy tube in indefinitely given her Hospice situation. Can follow up with Dr. Andrey Campanile in our office after discharge. Call if needed.   LOS: 6 days    Alyse Low 09/29/2011 11:06 AM  Long discussion with pt's caregiver.  She is frustrated about pt's health & worried about if could tolerate surgery yet worried about gallbladder as well.  She is sole caregiver & feels she is being pushed into accepting SNF for the pt.  I spent 30 min d/w her reason for perc chole to temporize cholecystitis & allow the infection to resolve.  We will wait at least 6 weeks before considering chole (maybe more weeks if the patient is improving & could be a reasonable OR candidate in a reasonable period of time.  We also need to see if the patient will physically recover enough to tolerate an operation.  O/w may leave drain in indefinitely if pt's health status further deteriorates.  Just remove the drain w/o chole is risky for recurrent infection.  Hard to know what is the best option given all have risk,  but 6 weeks allows the surgery to be technically easier, so wait until then before seeing what is the best option (admittedly all have some risk...).   Questions were answered.  She expressed understanding & appreciation.

## 2011-09-30 ENCOUNTER — Inpatient Hospital Stay (HOSPITAL_COMMUNITY)

## 2011-09-30 LAB — CULTURE, BLOOD (ROUTINE X 2)
Culture  Setup Time: 201302270142
Culture: NO GROWTH

## 2011-09-30 LAB — CREATININE, SERUM
Creatinine, Ser: 0.75 mg/dL (ref 0.50–1.10)
GFR calc non Af Amer: 81 mL/min — ABNORMAL LOW (ref >90)

## 2011-09-30 MED ORDER — POLYETHYLENE GLYCOL 3350 17 G PO PACK
17.0000 g | PACK | Freq: Two times a day (BID) | ORAL | Status: DC
  Administered 2011-09-30 – 2011-10-02 (×3): 17 g via ORAL
  Filled 2011-09-30 (×7): qty 1

## 2011-09-30 MED ORDER — FLEET ENEMA 7-19 GM/118ML RE ENEM
1.0000 | ENEMA | Freq: Once | RECTAL | Status: AC
  Administered 2011-09-30: 1 via RECTAL
  Filled 2011-09-30: qty 1

## 2011-09-30 NOTE — Progress Notes (Signed)
Subjective: Choking on breakfast, and  Asking where she is.  Objective: Vital signs in last 24 hours: Temp:  [97.3 F (36.3 C)-99.7 F (37.6 C)] 97.3 F (36.3 C) (03/05 0546) Pulse Rate:  [67-93] 67  (03/05 0546) Resp:  [16-18] 16  (03/05 0546) BP: (134-157)/(74-81) 157/81 mmHg (03/05 0546) SpO2:  [94 %] 94 % (03/05 0546) Last BM Date: 09/29/11 (mucousy. no real stool)  Intake/Output from previous day: 03/04 0701 - 03/05 0700 In: 1118 [P.O.:480; I.V.:638] Out: 3125 [Urine:3075; Drains:50] Intake/Output this shift:    PE:  Coughing alert, but doesn't remember where she is.  Chest Clear anteriorly  . Abd: not tender,  Distended, few BS,   Lab Results:  No results found for this basename: WBC:2,HGB:2,HCT:2,PLT:2 in the last 72 hours  Lab 09/29/11 0330 09/26/11 0330 09/25/11 0347 09/24/11 1718 09/23/11 1550  AST 9 9 7 8 10   ALT 7 7 8 9 11   ALKPHOS 82 99 99 117 129*  BILITOT 0.3 0.3 0.3 0.2* 0.3  PROT 5.8* 6.1 6.0 6.5 7.6  ALBUMIN 2.5* 2.6* 2.5* 2.8* 3.4*    BMET  Basename 09/30/11 0350 09/29/11 0330 09/28/11 0344  NA -- 137 140  K -- 3.5 3.9  CL -- 101 105  CO2 -- 28 30  GLUCOSE -- 94 91  BUN -- 5* 7  CREATININE 0.75 0.71 --  CALCIUM -- 8.5 8.3*   PT/INR No results found for this basename: LABPROT:2,INR:2 in the last 72 hours   Studies/Results: No results found.  Anti-infectives: Anti-infectives     Start     Dose/Rate Route Frequency Ordered Stop   09/29/11 1500   levofloxacin (LEVAQUIN) tablet 750 mg        750 mg Oral Daily 09/29/11 1436 10/07/11 0959   09/28/11 2000   Levofloxacin (LEVAQUIN) IVPB 750 mg  Status:  Discontinued        750 mg 100 mL/hr over 90 Minutes Intravenous Every 24 hours 09/28/11 1310 09/29/11 1436   09/27/11 2100   Levofloxacin (LEVAQUIN) IVPB 750 mg  Status:  Discontinued        750 mg 100 mL/hr over 90 Minutes Intravenous Every 48 hours 09/26/11 1040 09/28/11 1310   09/25/11 1430   ciprofloxacin (CIPRO) IVPB 400 mg       400 mg 200 mL/hr over 60 Minutes Intravenous  Once 09/25/11 1417 09/25/11 1520   09/23/11 2030   Levofloxacin (LEVAQUIN) IVPB 750 mg  Status:  Discontinued        750 mg 100 mL/hr over 90 Minutes Intravenous Every 24 hours 09/23/11 2018 09/26/11 1040         Current Facility-Administered Medications  Medication Dose Route Frequency Provider Last Rate Last Dose  . 0.9 %  sodium chloride infusion   Intravenous Continuous Osvaldo Shipper, MD 20 mL/hr at 09/29/11 1436    . ipratropium (ATROVENT) nebulizer solution 0.5 mg  0.5 mg Nebulization Q4H PRN Debby Crosley, MD       And  . albuterol (PROVENTIL) (5 MG/ML) 0.5% nebulizer solution 2.5 mg  2.5 mg Nebulization Q4H PRN Debby Crosley, MD      . ALPRAZolam Prudy Feeler) tablet 0.25 mg  0.25 mg Oral Q8H PRN Debby Crosley, MD   0.25 mg at 09/28/11 1157  . aspirin EC tablet 81 mg  81 mg Oral Daily Debby Crosley, MD   81 mg at 09/29/11 1005  . bisacodyl (DULCOLAX) suppository 10 mg  10 mg Rectal Once Marianna Fuss, PA  10 mg at 09/29/11 1208  . clonazePAM (KLONOPIN) tablet 1 mg  1 mg Oral QHS Debby Crosley, MD   1 mg at 09/29/11 2211  . digoxin (LANOXIN) tablet 250 mcg  250 mcg Oral Daily Debby Crosley, MD   250 mcg at 09/29/11 1005  . enoxaparin (LOVENOX) injection 40 mg  40 mg Subcutaneous QHS Letha Cape, PA   40 mg at 09/29/11 2210  . estrogens (conjugated) (PREMARIN) tablet 0.625 mg  0.625 mg Oral Daily Debby Crosley, MD   0.625 mg at 09/29/11 1005  . feeding supplement (ENSURE IMMUNE HEALTH) liquid 237 mL  237 mL Oral TID WC Marshall Cork, RD   237 mL at 09/29/11 1207  . FLUoxetine (PROZAC) capsule 20 mg  20 mg Oral Daily Debby Crosley, MD   20 mg at 09/29/11 1005  . haloperidol (HALDOL) tablet 0.5 mg  0.5 mg Oral Daily Osvaldo Shipper, MD      . haloperidol lactate (HALDOL) injection 2 mg  2 mg Intravenous Q6H PRN Mary A. Lynch, NP   2 mg at 09/29/11 0055  . hydrocortisone cream 1 %   Topical BID Osvaldo Shipper, MD      .  HYDROmorphone (DILAUDID) injection 1 mg  1 mg Intravenous Q4H PRN Osvaldo Shipper, MD   1 mg at 09/27/11 2155  . ibuprofen (ADVIL,MOTRIN) tablet 800 mg  800 mg Oral Q8H PRN Debby Crosley, MD   800 mg at 09/26/11 1611  . levofloxacin (LEVAQUIN) tablet 750 mg  750 mg Oral Daily Osvaldo Shipper, MD   750 mg at 09/29/11 1502  . levothyroxine (SYNTHROID, LEVOTHROID) tablet 50 mcg  50 mcg Oral Daily Debby Crosley, MD   50 mcg at 09/29/11 1005  . meclizine (ANTIVERT) tablet 25 mg  25 mg Oral TID PRN Gery Pray, MD   25 mg at 09/27/11 1800  . nitroGLYCERIN (NITROSTAT) SL tablet 0.4 mg  0.4 mg Sublingual Q5 Min x 3 PRN Debby Crosley, MD      . ondansetron (ZOFRAN) injection 4 mg  4 mg Intravenous Q6H PRN Debby Crosley, MD   4 mg at 09/25/11 1213  . ondansetron (ZOFRAN-ODT) disintegrating tablet 8 mg  8 mg Oral Q8H PRN Debby Crosley, MD      . pantoprazole (PROTONIX) EC tablet 40 mg  40 mg Oral Daily Debby Crosley, MD   40 mg at 09/29/11 1005  . polyethylene glycol (MIRALAX / GLYCOLAX) packet 17 g  17 g Oral Daily PRN Debby Crosley, MD   17 g at 09/28/11 1617  . polyethylene glycol (MIRALAX / GLYCOLAX) packet 17 g  17 g Oral Daily Osvaldo Shipper, MD   17 g at 09/29/11 1004  . potassium chloride 20 MEQ/15ML (10%) liquid 40 mEq  40 mEq Oral Once Osvaldo Shipper, MD   40 mEq at 09/29/11 1503  . QUEtiapine (SEROQUEL) tablet 12.5 mg  12.5 mg Oral QHS Osvaldo Shipper, MD   12.5 mg at 09/29/11 2210  . scopolamine (TRANSDERM-SCOP) 1.5 MG 1.5 mg  1 patch Transdermal Q72H Debby Crosley, MD   1.5 mg at 09/29/11 2315  . senna-docusate (Senokot-S) tablet 2 tablet  2 tablet Oral BID Gery Pray, MD   2 tablet at 09/29/11 2211  . DISCONTD: haloperidol (HALDOL) tablet 0.5 mg  0.5 mg Oral BID Debby Crosley, MD   0.5 mg at 09/29/11 1005  . DISCONTD: Levofloxacin (LEVAQUIN) IVPB 750 mg  750 mg Intravenous Q24H Jodelle Gross, PHARMD   750 mg at 09/28/11  2012    Assessment/Plan *Cholecystitis with cholelithiasis    Active Problems:  Aspiration pneumonia  Huntington's disease  Atrial fibrillation  Vertigo    Plan;  Dr. Michaell Cowing talked to family last PM and told them 6 week or more for drain. There are instructions on D/C for follow up in 4 weeks at our office with DR. Wilson.  LOS: 7 days    Sydney [name] 09/30/2011

## 2011-09-30 NOTE — Progress Notes (Addendum)
Consult Note from the Palliative Medicine Team at [facility] Patient ZO:XWRUE Sydney [name]      DOB: 1935-03-10      AVW:[id number]   Consult Requested by: Thermon Leyland    PCP: Ginette Otto, MD, MD Reason for Consultation: Goals of Care, Symptom Management, Care Coordination Phone Number:[phone]  Assessment and Plan:  77 yo woman with advanced Huntington's Disease current [facility] patient. She is frail, has characteristic neurological signs of HD, difficult to fully assess her mental status, she can answer appropriately in some cases-per caregivers cognition is declining.   Met with patient and family and Hosp[facility] [place]. Primary issues addressed:  1. DNR 2. Current [facility] Patient 3. Issues re: bili drain-will leave drain in and reassess in 6 weeks per surgery to see if patient is medically stable for a surgical procedure, lots of considerations still to be made in terms of surgery risk vs. Benefit. Continue antibiotics. 4. We discussed disposition in detail- right now primary caregiver, Fannie Knee feels like she can handle patient at home. Needs bili drain eduation. Wants to avoid SNF, frequent re-eval for [facility], prognosis is very difficult to determined, but family thinks she is clearly declining- new confusion and memory loss. 5. Comfort care/QOL still major goals for patient-minimize invasive or uncomfortable interventions, tests or procedures.   Brief HPI: This is a 77 year old female with known history of Huntington's disease. Approximately 3 weeks ago she developed nausea and vomiting and dizziness and was treated for a UTI with 10 days of Cipro. Symptoms did not resolve and further work-up revealed acute cholecystitis for which she had T-tube drain placed and IV abx started. Major question was should she have surgery or not given her illness progression and desire for comfort care. PMT asked to consult to assist family and patient in decision making.  ROS: Abdominal pain  intermittent, no fevers, chills, NVD. Has had some constipation.   PMH:  Past Medical History  Diagnosis Date  . Huntington disease   . Atrial fibrillation   . Chronic respiratory failure     nocturnal oxygen  . Complication of anesthesia     felt surgery with epidural     PSH: Past Surgical History  Procedure Date  . Colon surgery   . Abdominal hysterectomy   . Hemorrhoid surgery   . Tonsillectomy   . Appendectomy    I have reviewed the FH and SH and  If appropriate update it with new information. Allergies  Allergen Reactions  . Codeine Itching  . Penicillins Cross Reactors Hives   Scheduled Meds:   . aspirin EC  81 mg Oral Daily  . clonazePAM  1 mg Oral QHS  . digoxin  250 mcg Oral Daily  . enoxaparin  40 mg Subcutaneous QHS  . estrogens (conjugated)  0.625 mg Oral Daily  . feeding supplement  237 mL Oral TID WC  . FLUoxetine  20 mg Oral Daily  . haloperidol  0.5 mg Oral Daily  . hydrocortisone cream   Topical BID  . levofloxacin  750 mg Oral Daily  . levothyroxine  50 mcg Oral Daily  . pantoprazole  40 mg Oral Daily  . polyethylene glycol  17 g Oral BID  . QUEtiapine  12.5 mg Oral QHS  . scopolamine  1 patch Transdermal Q72H  . senna-docusate  2 tablet Oral BID  . sodium phosphate  1 enema Rectal Once  . DISCONTD: polyethylene glycol  17 g Oral Daily   Continuous Infusions:   .  sodium chloride 20 mL/hr at 09/29/11 1436   PRN Meds:.albuterol, ALPRAZolam, haloperidol lactate, HYDROmorphone (DILAUDID) injection, ibuprofen, ipratropium, meclizine, nitroGLYCERIN, ondansetron (ZOFRAN) IV, ondansetron, polyethylene glycol    BP 104/65  Pulse 78  Temp(Src) 97.8 F (36.6 C) (Oral)  Resp 18  Ht 5\' 6"  (1.676 m)  Wt 57.607 kg (127 lb)  BMI 20.50 kg/m2  SpO2 91%   PPS:40   Intake/Output Summary (Last 24 hours) at 10/01/11 0917 Last data filed at 10/01/11 5409  Gross per 24 hour  Intake 1049.09 ml  Output   1650 ml  Net -600.91 ml    Physical  Exam: Thin, frail, NAD Hemiballismus, mild tremor, muscle wasting Alert and oriented, but only able to answer simple questions.  Labs: CBC    Component Value Date/Time   WBC 11.5* 10/01/2011 0350   RBC 3.90 10/01/2011 0350   HGB 12.0 10/01/2011 0350   HCT 35.7* 10/01/2011 0350   PLT 331 10/01/2011 0350   MCV 91.5 10/01/2011 0350   MCH 30.8 10/01/2011 0350   MCHC 33.6 10/01/2011 0350   RDW 14.0 10/01/2011 0350   LYMPHSABS 1.2 09/23/2011 1550   MONOABS 1.2* 09/23/2011 1550   EOSABS 0.0 09/23/2011 1550   BASOSABS 0.0 09/23/2011 1550    BMET    Component Value Date/Time   NA 138 10/01/2011 0350   K 3.9 10/01/2011 0350   CL 100 10/01/2011 0350   CO2 30 10/01/2011 0350   GLUCOSE 97 10/01/2011 0350   BUN 10 10/01/2011 0350   CREATININE 0.88 10/01/2011 0350   CALCIUM 8.9 10/01/2011 0350   GFRNONAA 63* 10/01/2011 0350   GFRAA 73* 10/01/2011 0350    CMP     Component Value Date/Time   NA 138 10/01/2011 0350   K 3.9 10/01/2011 0350   CL 100 10/01/2011 0350   CO2 30 10/01/2011 0350   GLUCOSE 97 10/01/2011 0350   BUN 10 10/01/2011 0350   CREATININE 0.88 10/01/2011 0350   CALCIUM 8.9 10/01/2011 0350   PROT 5.8* 09/29/2011 0330   ALBUMIN 2.5* 09/29/2011 0330   AST 9 09/29/2011 0330   ALT 7 09/29/2011 0330   ALKPHOS 82 09/29/2011 0330   BILITOT 0.3 09/29/2011 0330   GFRNONAA 63* 10/01/2011 0350   GFRAA 73* 10/01/2011 0350     Time In Time Out Total Time Spent with Patient Total Overall Time     50 minues    Greater than 50%  of this time was spent counseling and coordinating care related to the above assessment and plan.

## 2011-09-30 NOTE — Progress Notes (Signed)
[facility] MSW note: MSW attended Goals of Care meeting with Dr. Phillips Odor, Patient(pt), [name], and [name]. Discussed pt's status and goals. Dr. Phillips Odor answered [name]'s questions on the biliary drain and possible surgery. Sydney [name] expressed understanding of leaving the drain in to try to cleear the infection and reduction inflamation. Sydney [name] aware that possible surgery to be re-evaluated in about 6 weeks. Sydney [name] desires quality of life for pt. Sydney [name] also desires for pt to be comfortable. [facility] will continue to re-evaluate for [facility] eligibility as pt's disease progresses. Sydney [name] desires to take pt home with continued hospice services. Sydney [name] does not want snf now for pt. MSW encouraged Sydney [name] to consider hiring a sitter to assist in pt's care as needed. MSW ofered support and reassurance of continued hospice care. MSW spoke with Unice Bailey, hospital social worker to give update.   Sydney [name], MSW

## 2011-09-30 NOTE — Progress Notes (Signed)
Out patient follow-up with Dr. Andrey Campanile.  See last night's note 2 weeks of Abx total - PO upon D/C. Will sign off - call with questions

## 2011-09-30 NOTE — Progress Notes (Signed)
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   Pt is DNR code.    Pt alert & admits to being confused about where she is and her name. Pt lying upright in bed, with no complaints of pain or discomfort.    No family present.  Staff RN giving meds in applesauce.  This RN scheduled a PMT consult for [facility] - to clarify with patient and caregiver the options about biliary tube vs surgery and options of quality of life regarding these.  Scheduled to meet with Dr. Julaine Fusi at 3pm today with [facility] SW-Debbie.  [facility] for caregiver-[name] @ [phone].  Please call [facility] @ [phone] with any needs.  Thank you.  Joneen Boers, RN  [facility]  Hospice Liaison

## 2011-09-30 NOTE — Progress Notes (Signed)
UR completed 

## 2011-09-30 NOTE — Progress Notes (Signed)
  Subjective: Chole drain placed 2/28 Pt confused today Up in bed and seems to feel fine; no complaints  Objective: Vital signs in last 24 hours: Temp:  [97.3 F (36.3 C)-99.7 F (37.6 C)] 97.3 F (36.3 C) (03/05 0546) Pulse Rate:  [67-93] 67  (03/05 0546) Resp:  [16-18] 16  (03/05 0546) BP: (134-157)/(74-81) 157/81 mmHg (03/05 0546) SpO2:  [94 %] 94 % (03/05 0546) Last BM Date: 09/29/11 (mucousy. no real stool)  Intake/Output from previous day: 03/04 0701 - 03/05 0700 In: 1118 [P.O.:480; I.V.:638] Out: 3125 [Urine:3075; Drains:50] Intake/Output this shift:    PE:  Chole drain intact Output 50 cc 3/4 100 cc in bag now Output serous color Site clean and dry; NT  Lab Results:  No results found for this basename: WBC:2,HGB:2,HCT:2,PLT:2 in the last 72 hours BMET  Basename 09/30/11 0350 09/29/11 0330 09/28/11 0344  NA -- 137 140  K -- 3.5 3.9  CL -- 101 105  CO2 -- 28 30  GLUCOSE -- 94 91  BUN -- 5* 7  CREATININE 0.75 0.71 --  CALCIUM -- 8.5 8.3*   PT/INR No results found for this basename: LABPROT:2,INR:2 in the last 72 hours ABG No results found for this basename: PHART:2,PCO2:2,PO2:2,HCO3:2 in the last 72 hours  Studies/Results: No results found.  Anti-infectives: Anti-infectives     Start     Dose/Rate Route Frequency Ordered Stop   09/29/11 1500   levofloxacin (LEVAQUIN) tablet 750 mg        750 mg Oral Daily 09/29/11 1436 10/07/11 0959   09/28/11 2000   Levofloxacin (LEVAQUIN) IVPB 750 mg  Status:  Discontinued        750 mg 100 mL/hr over 90 Minutes Intravenous Every 24 hours 09/28/11 1310 09/29/11 1436   09/27/11 2100   Levofloxacin (LEVAQUIN) IVPB 750 mg  Status:  Discontinued        750 mg 100 mL/hr over 90 Minutes Intravenous Every 48 hours 09/26/11 1040 09/28/11 1310   09/25/11 1430   ciprofloxacin (CIPRO) IVPB 400 mg        400 mg 200 mL/hr over 60 Minutes Intravenous  Once 09/25/11 1417 09/25/11 1520   09/23/11 2030   Levofloxacin  (LEVAQUIN) IVPB 750 mg  Status:  Discontinued        750 mg 100 mL/hr over 90 Minutes Intravenous Every 24 hours 09/23/11 2018 09/26/11 1040          Assessment/Plan: s/p Percutaneous cholecystostomy drain placed 2/28  Output remains good Drain needs to remain for 6-8 weeks unless pt goes to OR  Call if need [place]    Janeane Cozart A 09/30/2011

## 2011-09-30 NOTE — Progress Notes (Signed)
PCP: Ginette Otto, MD, MD  Brief HPI:  This is a 77 year old female with known history of Huntington's disease. Approximately 4 weeks ago she developed nausea and vomiting and dizziness. It has been intermittent. She was diagnosed with a UTI and treated with 10 days of Cipro. There was a question of aspiration with her emesis and her Huntington's disease. As stated her nausea and vomiting has been intermittent, however, night before admission it came back very severe. It was bilious in nature. Patient had no diarrhea. She does complain of stomach pain generalized. There is report of subjective fevers and chills. She was prescribed to meclizine, Zofran and Phenergan suppositories by her PCP. These are not controlling her symptoms. History was provided by the patient and her power of attorney Eugenio Hoes [phone].  Past medical history:  Past Medical History   Diagnosis  Date   .  Huntington disease    .  Atrial fibrillation    .  Chronic respiratory failure      nocturnal oxygen   .  Complication of anesthesia      felt surgery with epidural    Past Surgical History   Procedure  Date   .  Colon surgery    .  Abdominal hysterectomy    .  Hemorrhoid surgery    .  Tonsillectomy    .  Appendectomy     Consultants: CCS and IR  Procedures: Cholecystostomy tube placement 2/28  Subjective: Patient complaining of nausea today. Not eating today. No BM's. Abdomen more distended.   Objective: Vital signs in last 24 hours: Temp:  [97.3 F (36.3 C)-99.7 F (37.6 C)] 97.3 F (36.3 C) (03/05 0546) Pulse Rate:  [67-93] 67  (03/05 0546) Resp:  [16-18] 16  (03/05 0546) BP: (134-157)/(74-81) 157/81 mmHg (03/05 0546) SpO2:  [94 %] 94 % (03/05 0546) Weight change:  Last BM Date: 09/29/11  Intake/Output from previous day: 03/04 0701 - 03/05 0700 In: 1118 [P.O.:480; I.V.:638] Out: 3125 [Urine:3075; Drains:50] Intake/Output this shift: Total I/O In: 360 [P.O.:360] Out: 300  [Urine:300]  General appearance: alert, cooperative and no distress  Resp: clear to auscultation bilaterally  Cardio: regular rate and rhythm, S1, S2 normal, no murmur, click, rub or gallop  Abdomen: More Distended today. Slightly Tender. No rebound or rigidity. BS present. Neurologic: Grossly normal. Has huntington chorea.   Lab Results: No results found for this basename: WBC:2,HGB:2,HCT:2,PLT:2 in the last 72 hours BMET  Basename 09/30/11 0350 09/29/11 0330 09/28/11 0344  NA -- 137 140  K -- 3.5 3.9  CL -- 101 105  CO2 -- 28 30  GLUCOSE -- 94 91  BUN -- 5* 7  CREATININE 0.75 0.71 --  CALCIUM -- 8.5 8.3*  ALT -- 7 --    Studies/Results: No results found.  Medications:  Scheduled:    . aspirin EC  81 mg Oral Daily  . clonazePAM  1 mg Oral QHS  . digoxin  250 mcg Oral Daily  . enoxaparin  40 mg Subcutaneous QHS  . estrogens (conjugated)  0.625 mg Oral Daily  . feeding supplement  237 mL Oral TID WC  . FLUoxetine  20 mg Oral Daily  . haloperidol  0.5 mg Oral Daily  . hydrocortisone cream   Topical BID  . levofloxacin  750 mg Oral Daily  . levothyroxine  50 mcg Oral Daily  . pantoprazole  40 mg Oral Daily  . polyethylene glycol  17 g Oral Daily  . potassium chloride  40 mEq Oral Once  . QUEtiapine  12.5 mg Oral QHS  . scopolamine  1 patch Transdermal Q72H  . senna-docusate  2 tablet Oral BID  . DISCONTD: haloperidol  0.5 mg Oral BID  . DISCONTD: levofloxacin (LEVAQUIN) IV  750 mg Intravenous Q24H    Assessment/Plan:  Principal Problem:  *Cholecystitis with cholelithiasis Active Problems:  Aspiration pneumonia  Huntington's disease  Atrial fibrillation  Vertigo    1. Cholecystitis with Cholelithiasis: Status post cholecystostomy tube placement by IR. Surgery is following as well. Continue antibiotics for 14 days total. Change to PO.  2. Colonic Ileus: Distension is worse today. Will repeat AAS. Laxatives.  3. Vertigo: Meclizine. Stable.  4. Atrial  Fibrillation: rate controlled. Continue Digoxin. Not on anticoagulation.   5. H/O Huntington's Disease: Under hospice care.   6. Aspiration Pneumonia: Stable on antibiotics. Monitor closely. Swallow evaluation done. Hold off on new antibiotics for now.  7. DNR   8. Elevated Creatinine and high normal Potassium level: Improved with IVF   9. Confusion: Likely from acute hospitalization, procedures and narcotics. Seroquel seems to have helped.  Disposition: POA now requesting SNF. Hospice and SW assisting with same. Not ready for discharge due to worsening distension.    LOS: 7 days   [facility] Pager [phone] 09/30/2011, 1:54 PM

## 2011-09-30 NOTE — Discharge Instructions (Signed)
Cholecystitis   Cholecystitis is swelling and irritation (inflammation) of your gallbladder. This often happens when gallstones or sludge build up in the gallbladder. Treatment is needed right away.  HOME CARE  Home care depends on how you were treated. In general:   If you were given antibiotic medicine, take it as told. Finish the medicine even if you start to feel better.   Only take medicines as told by your doctor.   Eat low-fat foods until your next doctor visit.   Keep all doctor visits as told.  GET HELP RIGHT AWAY IF:   You have more pain and medicine does not help.   Your pain moves to a different part of your belly (abdomen) or to your back.   You have a fever.   You feel sick to your stomach (nauseous).   You throw up (vomit).  MAKE SURE YOU:   Understand these instructions.   Will watch your condition.   Will get help right away if you are not doing well or get worse.  Document Released: 07/03/2011 Document Reviewed: 07/01/2011  [employer] Patient Information 2012 [employer].

## 2011-09-30 NOTE — Progress Notes (Signed)
CSW received call from Hospice MSW, Elijio Miles (cell#: [phone]) stating she spoke with patient's POA, Fannie Knee who expressed interest in SNF placement. CSW left message with Fannie Knee (ph#: [phone]) CSW completed FL2 and faxed information out to [facility], will await call back from Fannie Knee to explain SNF placement process and insurance copayments.    Unice Bailey, LCSWA [phone]

## 2011-10-01 ENCOUNTER — Inpatient Hospital Stay (HOSPITAL_COMMUNITY)

## 2011-10-01 LAB — URINE MICROSCOPIC-ADD ON

## 2011-10-01 LAB — URINALYSIS, ROUTINE W REFLEX MICROSCOPIC
Glucose, UA: NEGATIVE mg/dL
Ketones, ur: NEGATIVE mg/dL
Nitrite: NEGATIVE
Protein, ur: NEGATIVE mg/dL

## 2011-10-01 LAB — BASIC METABOLIC PANEL
CO2: 30 mEq/L (ref 19–32)
Calcium: 8.9 mg/dL (ref 8.4–10.5)
Chloride: 100 mEq/L (ref 96–112)
Creatinine, Ser: 0.88 mg/dL (ref 0.50–1.10)
GFR calc Af Amer: 73 mL/min — ABNORMAL LOW (ref >90)
Sodium: 138 mEq/L (ref 135–145)

## 2011-10-01 LAB — CBC
MCH: 30.8 pg (ref 26.0–34.0)
Platelets: 331 10*3/uL (ref 150–400)
RBC: 3.9 MIL/uL (ref 3.87–5.11)
RDW: 14 % (ref 11.5–15.5)
WBC: 11.5 10*3/uL — ABNORMAL HIGH (ref 4.0–10.5)

## 2011-10-01 MED ORDER — DOCUSATE SODIUM 100 MG PO CAPS
100.0000 mg | ORAL_CAPSULE | Freq: Every day | ORAL | Status: DC
  Administered 2011-10-01 – 2011-10-02 (×2): 100 mg via ORAL
  Filled 2011-10-01 (×3): qty 1

## 2011-10-01 NOTE — Plan of Care (Signed)
Problem: Inadequate Intake (NI-2.1) Goal: Food and/or nutrient delivery Individualized approach for food/nutrient provision.  Outcome: Not Progressing Intake remains poor, pt got choked on liquids today per RN, and is currently nauseated, got Zofran

## 2011-10-01 NOTE — Progress Notes (Signed)
Patient discussed at the Long Length of Stay Sydney [name] Weeks 10/01/2011  

## 2011-10-01 NOTE — Progress Notes (Signed)
Room 13[address]  [facility]  [facility] RN Visit  NON-Related admission to [facility] diagnosis of Huntington's Chorea.   Pt is DNR code.    Pt lying in bed, sleeping soundly without evidence of pain or discomfort.    PCG-[name] present.  PCG states pt has been constipated and enema was not successful last pm.  Bowel regimen increased to promote BM.    Following PMT consult for GOC, PCG Fannie Knee states she is comfortable taking pt home and hospice continuing to assist and especially with the biliary tube drainage.   PCG Fannie Knee states pt's WBC has increased.   No discharge plans on chart.  Please call [facility] @ [phone] with any needs.  Thank you.  Joneen Boers, RN  [facility]  Hospice Liaison

## 2011-10-01 NOTE — Progress Notes (Signed)
Nutrition Follow-up  Diet Order: Dysphagia 3, thin liquids  - Nursing reports pt's intake has been poor today as pt got choked on liquids during lunch and c/o being nauseated - received Zofran. Typical intake has been <50% of meals during the past few days. Noted pt had goals of care yesterday with comfort care as major goal for pt, however pt remains on floor, not in palliative care, and comfort feeds not ordered.   Meds: Scheduled Meds:   . aspirin EC  81 mg Oral Daily  . clonazePAM  1 mg Oral QHS  . digoxin  250 mcg Oral Daily  . docusate sodium  100 mg Oral Daily  . enoxaparin  40 mg Subcutaneous QHS  . estrogens (conjugated)  0.625 mg Oral Daily  . feeding supplement  237 mL Oral TID WC  . FLUoxetine  20 mg Oral Daily  . haloperidol  0.5 mg Oral Daily  . hydrocortisone cream   Topical BID  . levofloxacin  750 mg Oral Daily  . levothyroxine  50 mcg Oral Daily  . pantoprazole  40 mg Oral Daily  . polyethylene glycol  17 g Oral BID  . QUEtiapine  12.5 mg Oral QHS  . scopolamine  1 patch Transdermal Q72H  . senna-docusate  2 tablet Oral BID  . sodium phosphate  1 enema Rectal Once  . DISCONTD: polyethylene glycol  17 g Oral Daily   Continuous Infusions:   . sodium chloride 1,000 mL (10/01/11 1559)   PRN Meds:.albuterol, ALPRAZolam, haloperidol lactate, HYDROmorphone (DILAUDID) injection, ibuprofen, ipratropium, meclizine, nitroGLYCERIN, ondansetron (ZOFRAN) IV, ondansetron, polyethylene glycol  Labs:  CMP     Component Value Date/Time   NA 138 10/01/2011 0350   K 3.9 10/01/2011 0350   CL 100 10/01/2011 0350   CO2 30 10/01/2011 0350   GLUCOSE 97 10/01/2011 0350   BUN 10 10/01/2011 0350   CREATININE 0.88 10/01/2011 0350   CALCIUM 8.9 10/01/2011 0350   PROT 5.8* 09/29/2011 0330   ALBUMIN 2.5* 09/29/2011 0330   AST 9 09/29/2011 0330   ALT 7 09/29/2011 0330   ALKPHOS 82 09/29/2011 0330   BILITOT 0.3 09/29/2011 0330   GFRNONAA 63* 10/01/2011 0350   GFRAA 73* 10/01/2011 0350     Intake/Output  Summary (Last 24 hours) at 10/01/11 1626 Last data filed at 10/01/11 1500  Gross per 24 hour  Intake 839.09 ml  Output   1290 ml  Net -450.91 ml   Last BM - 09/29/11  Weight Status: No new weights  Nutrition Dx: Inadequate oral intake - ongoing, likely to continue  Goal: Nutrition per pt and POA wishes - ongoing  Intervention: Continue to recommend comfort feeds if comfort is the goal. Hopefully Zofran will improve/resolve nausea.    Monitor: Weights, labs, intake, goals of nutrition    Marshall Cork Pager #: [phone]

## 2011-10-01 NOTE — Progress Notes (Signed)
Subjective: Patient has been coughing up some phlem. Reportedly she has not been in any respiratory distress.  Denies any hemoptysis or recent bouts of emesis.  No acute issues overnight.  Objective: Filed Vitals:   09/30/11 2058 09/30/11 2110 10/01/11 0450 10/01/11 1310  BP: 116/70  104/65 142/72  Pulse: 80 76 78 91  Temp: 99.4 F (37.4 C)  97.8 F (36.6 C) 97.6 F (36.4 C)  TempSrc: Oral  Oral Oral  Resp: 18  18 16   Height:      Weight:      SpO2: 91%  91% 91%   Weight change:   Intake/Output Summary (Last 24 hours) at 10/01/11 1553 Last data filed at 10/01/11 1500  Gross per 24 hour  Intake 839.09 ml  Output   1290 ml  Net -450.91 ml   General appearance: alert, cooperative and no distress, sitting up in bed. Resp: clear to auscultation bilaterally anteriorly Cardio: regular rate and rhythm, S1, S2 normal, no murmur, click, rub or gallop  Abdomen: More Distended today. Slightly Tender. No rebound or rigidity. BS present.  Neurologic: Grossly normal. Has huntington chorea.     Lab Results:  Basename 10/01/11 0350 09/30/11 0350 09/29/11 0330  NA 138 -- 137  K 3.9 -- 3.5  CL 100 -- 101  CO2 30 -- 28  GLUCOSE 97 -- 94  BUN 10 -- 5*  CREATININE 0.88 0.75 --  CALCIUM 8.9 -- 8.5  MG -- -- --  PHOS -- -- --    Basename 09/29/11 0330  AST 9  ALT 7  ALKPHOS 82  BILITOT 0.3  PROT 5.8*  ALBUMIN 2.5*   No results found for this basename: LIPASE:2,AMYLASE:2 in the last 72 hours  Basename 10/01/11 0350  WBC 11.5*  NEUTROABS --  HGB 12.0  HCT 35.7*  MCV 91.5  PLT 331   No results found for this basename: CKTOTAL:3,CKMB:3,CKMBINDEX:3,TROPONINI:3 in the last 72 hours No components found with this basename: POCBNP:3 No results found for this basename: DDIMER:2 in the last 72 hours No results found for this basename: HGBA1C:2 in the last 72 hours No results found for this basename: CHOL:2,HDL:2,LDLCALC:2,TRIG:2,CHOLHDL:2,LDLDIRECT:2 in the last 72 hours No  results found for this basename: TSH,T4TOTAL,FREET3,T3FREE,THYROIDAB in the last 72 hours No results found for this basename: VITAMINB12:2,FOLATE:2,FERRITIN:2,TIBC:2,IRON:2,RETICCTPCT:2 in the last 72 hours  Micro Results: Recent Results (from the past 240 hour(s))  CULTURE, BLOOD (ROUTINE X 2)     Status: Normal   Collection Time   09/23/11  9:10 PM      Component Value Range Status Comment   Specimen Description BLOOD RIGHT ANTECUBITAL   Final    Special Requests BOTTLES DRAWN AEROBIC AND ANAEROBIC [facility]   Final    Culture  Setup Time [id number]   Final    Culture NO GROWTH 5 DAYS   Final    Report Status 09/30/2011 FINAL   Final   CULTURE, BLOOD (ROUTINE X 2)     Status: Normal   Collection Time   09/23/11  9:15 PM      Component Value Range Status Comment   Specimen Description BLOOD RIGHT ANTECUBITAL   Final    Special Requests BOTTLES DRAWN AEROBIC ONLY 4CC   Final    Culture  Setup Time [id number]   Final    Culture NO GROWTH 5 DAYS   Final    Report Status 09/30/2011 FINAL   Final   CULTURE, ROUTINE-ABSCESS     Status: Normal   Collection  Time   09/25/11  2:46 PM      Component Value Range Status Comment   Specimen Description GALL BLADDER ASPIRATE   Final    Special Requests Normal   Final    Gram Stain     Final    Value: MODERATE WBC PRESENT,BOTH PMN AND MONONUCLEAR     NO SQUAMOUS EPITHELIAL CELLS SEEN     NO ORGANISMS SEEN   Culture NO GROWTH 3 DAYS   Final    Report Status 09/28/2011 FINAL   Final     Studies/Results: Dg Abd Acute W/chest  09/30/2011  *RADIOLOGY REPORT*  Clinical Data: Abdominal distention.  ACUTE ABDOMEN SERIES (ABDOMEN 2 VIEW & CHEST 1 VIEW)  Comparison: 09/27/2011  Findings: Right sided abdominal drainage catheters in place, likely right nephrostomy tube.  Large stool burden throughout the colon. Mild gaseous distention of the colon.  No organomegaly.  Heart is mildly enlarged.  No confluent airspace opacities or effusions.  Probable  scarring in the lung bases.  IMPRESSION: Large stool burden throughout the colon.  Mild gaseous distention of the colon, possible mild ileus.  Cardiomegaly.  Suspect bibasilar scarring.  Original Report Authenticated By: Cyndie Chime, M.D.    Medications: I have reviewed the patient's current medications.   Patient Active Hospital Problem List: Cholecystitis with cholelithiasis (09/24/2011) S/p cholecystostomy tube placed by IR.  Surgery will reevaluate at later date as outpatient for possible surgical intervention.  POA was told that patient would require an open procedure as opposed to laparoscopy.  Felt to be high rist due to Huntington's disease.  Aspiration pneumonia (09/23/2011) Pt is currently on Levaquin.  Will plan on continuing and likely related to her underlying condition (huntington's).  Will check portable chest x ray due to recent increase in WBC count.  Huntington's disease (09/23/2011) Patient is currently listed as comfort care. POA aware that this is progressive disease.  Atrial fibrillation (09/23/2011) rate controlled. Continue Digoxin. Not on anticoagulation.   Vertigo (09/24/2011) Stable patient is currently on meclizine.   Disposition will be home with home hospice likely tomorrow.   LOS: 8 days   Penny Pia M.D.  [facility] 10/01/2011, 3:53 PM

## 2011-10-01 NOTE — Progress Notes (Signed)
Patient given fleets enema at 2110. Only results so far is mucous and yellowish liquid on pad. Patient to have miralax and senekot at hs.

## 2011-10-01 NOTE — Progress Notes (Signed)
ANTIBIOTIC CONSULT NOTE - FOLLOW UP  Pharmacy Consult for  Levaquin Indication: acute cholecystitis (s/p perc drain), Aspiration PNA  Allergies  Allergen Reactions  . Codeine Itching  . Penicillins Cross Reactors Hives    Patient Measurements: Height: 5\' 6"  (167.6 cm) Weight: 127 lb (57.607 kg) IBW/kg (Calculated) : 59.3   Vital Signs: Temp: 97.8 F (36.6 C) (03/06 0450) Temp src: Oral (03/06 0450) BP: 104/65 mmHg (03/06 0450) Pulse Rate: 78  (03/06 0450)  Labs:  Basename 10/01/11 0350 09/30/11 0350 09/29/11 0330  WBC 11.5* -- --  HGB 12.0 -- --  PLT 331 -- --  LABCREA -- -- --  CREATININE 0.88 0.75 0.71   Estimated Creatinine Clearance: 50.2 ml/min (by C-G formula based on Cr of 0.88).    Medications: Anti-infectives     Start     Dose/Rate Route Frequency Ordered Stop   09/29/11 1500   levofloxacin (LEVAQUIN) tablet 750 mg        750 mg Oral Daily 09/29/11 1436 10/07/11 0959   09/28/11 2000   Levofloxacin (LEVAQUIN) IVPB 750 mg  Status:  Discontinued        750 mg 100 mL/hr over 90 Minutes Intravenous Every 24 hours 09/28/11 1310 09/29/11 1436   09/27/11 2100   Levofloxacin (LEVAQUIN) IVPB 750 mg  Status:  Discontinued        750 mg 100 mL/hr over 90 Minutes Intravenous Every 48 hours 09/26/11 1040 09/28/11 1310   09/25/11 1430   ciprofloxacin (CIPRO) IVPB 400 mg        400 mg 200 mL/hr over 60 Minutes Intravenous  Once 09/25/11 1417 09/25/11 1520   09/23/11 2030   Levofloxacin (LEVAQUIN) IVPB 750 mg  Status:  Discontinued        750 mg 100 mL/hr over 90 Minutes Intravenous Every 24 hours 09/23/11 2018 09/26/11 1040          Assessment:  77 YOF on Day # 9 of 14 Levaquin for possible aspiration PNA/acute choleystitis. Changed to PO on 3/4.  Tm 99.4  WBCs now trending up.  SCr remains stable.  Gall bladder and blood cxs negative.  Goal of Therapy:  Appropriate renal dose of Levaquin  Plan:   Cont Levaquin to 750mg  PO q24h.  Continue to  monitor and adjust dose as necessary.  Charolotte Eke, PharmD, pager [phone]. 10/01/2011,7:55 AM.

## 2011-10-02 ENCOUNTER — Inpatient Hospital Stay (HOSPITAL_COMMUNITY)

## 2011-10-02 LAB — CBC
HCT: 34.7 % — ABNORMAL LOW (ref 36.0–46.0)
MCHC: 32.3 g/dL (ref 30.0–36.0)
Platelets: 310 10*3/uL (ref 150–400)
RDW: 13.9 % (ref 11.5–15.5)
WBC: 8.9 10*3/uL (ref 4.0–10.5)

## 2011-10-02 MED ORDER — ONDANSETRON 8 MG PO TBDP: 8.0000 mg | ORAL_TABLET | Freq: Three times a day (TID) | ORAL | Status: DC | PRN

## 2011-10-02 MED ORDER — DSS 100 MG PO CAPS: 100.0000 mg | ORAL_CAPSULE | Freq: Every day | ORAL | Status: AC

## 2011-10-02 MED ORDER — LEVOFLOXACIN 750 MG PO TABS: 750.0000 mg | ORAL_TABLET | Freq: Every day | ORAL | Status: AC

## 2011-10-02 MED ORDER — IOHEXOL 300 MG/ML  SOLN
80.0000 mL | Freq: Once | INTRAMUSCULAR | Status: AC | PRN
  Administered 2011-10-02: 80 mL via INTRAVENOUS

## 2011-10-02 NOTE — Progress Notes (Signed)
Room 13[address]   [facility]  [facility] RN Visit  NON-Related admission to [facility] diagnosis of Huntington's Chorea.  Pt is DNR code.    Pt lying in bed, sleeping soundly and did not awaken with verbal stimuli.  Pt appeared comfortable.  PCG Fannie Knee was not present.    Per orders on chart, PCG Fannie Knee to be educated in drainage of biliary tube prior to discharge.  Orders call for 3x per day draining.   CM RN called and discussed possible discharge today.  Advised CM RN patient may be transported via [facility] for safety.   CM RN will confirm with MD disposition and advise.  Please call [facility] @ [phone] with any needs.  Thank you.  Joneen Boers, RN  [facility]  Hospice Liaison

## 2011-10-02 NOTE — Discharge Summary (Addendum)
Physician Discharge Summary  Patient ID: Sydney [name] MRN: [id number] DOB/AGE: 09/24/1934 77 y.o.  Admit date: 09/23/2011 Discharge date: 10/02/2011  Primary Care Physician:  Ginette Otto, MD, MD   Discharge Diagnoses:    Principal Problem:  *Cholecystitis with cholelithiasis Active Problems:  Aspiration pneumonia  Huntington's disease  Atrial fibrillation  Vertigo    Medication List  As of 10/02/2011 11:25 AM   ASK your doctor about these medications         ALPRAZolam 0.25 MG tablet   Commonly known as: XANAX   Take 0.25 mg by mouth every 8 (eight) hours as needed. For anxiety      aspirin EC 81 MG tablet   Take 81 mg by mouth daily.      clonazePAM 0.5 MG tablet   Commonly known as: KLONOPIN   Take 1 mg by mouth at bedtime.      digoxin 0.25 MG tablet   Commonly known as: LANOXIN   Take 250 mcg by mouth daily.      estrogens (conjugated) 0.625 MG tablet   Commonly known as: PREMARIN   Take 0.625 mg by mouth See admin instructions. Taken daily for first 25 days of each month.      FLUoxetine 20 MG capsule   Commonly known as: PROZAC   Take 20 mg by mouth daily.      haloperidol 0.5 MG tablet   Commonly known as: HALDOL   Take 0.5 mg by mouth 2 (two) times daily.      ibuprofen 800 MG tablet   Commonly known as: ADVIL,MOTRIN   Take 800 mg by mouth every 8 (eight) hours as needed. For pain      lansoprazole 30 MG capsule   Commonly known as: PREVACID   Take 30 mg by mouth 2 (two) times daily.      levothyroxine 50 MCG tablet   Commonly known as: SYNTHROID, LEVOTHROID   Take 50 mcg by mouth daily.      meclizine 25 MG tablet   Commonly known as: ANTIVERT   Take 25 mg by mouth 3 (three) times daily as needed. For dizziness/nausea.      nitroGLYCERIN 0.4 MG SL tablet   Commonly known as: NITROSTAT   Place 0.4 mg under the tongue every 5 (five) minutes x 3 doses as needed.      ondansetron 8 MG disintegrating tablet   Commonly known as:  ZOFRAN-ODT   Take 8 mg by mouth every 8 (eight) hours as needed. For nausea.      polyethylene glycol packet   Commonly known as: MIRALAX / GLYCOLAX   Take 17 g by mouth daily as needed. For constipation.      promethazine 25 MG suppository   Commonly known as: PHENERGAN   Place 25 mg rectally every 8 (eight) hours as needed. For nausea.      scopolamine 1.5 MG   Commonly known as: TRANSDERM-SCOP   Place 1 patch onto the skin every 3 (three) days.      senna-docusate 8.6-50 MG per tablet   Commonly known as: Senokot-S   Take 2 tablets by mouth 2 (two) times daily.             Disposition and Follow-up:  Will have patient follow up with her primary care physician in 1 week.  Also is to follow up with her surgeon in 4-6 weeks for further evaluation and management. Surgery at [facility] surgery [address] [place]. [phone]  Consults:  Pt has been followed by Radiology for percutaneous drain placement of gall bladder and Surgery Dr. Mary Sella. Wilson from [place] [facility]   Significant Diagnostic Studies:  Dg Chest 2 View  09/23/2011  *RADIOLOGY REPORT*  Clinical Data: Confusion and weakness.  CHEST - 2 VIEW  Comparison: None.  Findings: There is cardiomegaly.  Lung volumes are low with small bilateral pleural effusions basilar airspace disease, worse on the left.  No pneumothorax is identified.  IMPRESSION: Small bilateral pleural effusions and basilar airspace disease, worse on the left, could reflect atelectasis or pneumonia.  Original Report Authenticated By: Bernadene Bell. Maricela Curet, M.D.   US Abdomen Complete  09/24/2011   *RADIOLOGY REPORT*  Clinical Data:  Nausea and vomiting.  COMPLETE ABDOMINAL ULTRASOUND  Comparison:  CT abdomen pelvis 09/23/2011 and ultrasound abdomen 12/23/2005.  Findings:  Gallbladder:  Shadowing echogenic stones measure up to 7 mm. Gallbladder wall measures 5 mm.  Edema is seen in the gallbladder fossa.  No sonographic Murphy's sign.   Common bile duct:  Measures 6 mm, within normal limits for age.  Liver:  Subtle intrahepatic biliary duct dilatation.  Parenchymal echogenicity is otherwise uniform.  IVC:  Appears normal.  Pancreas:  No focal abnormality seen.  Spleen:  Measures 6.7 cm, negative.  Right Kidney:  Measures 10.5 cm with uniform parenchymal echogenicity.  A 7 mm cyst is seen in the upper pole.  No hydronephrosis.  Left Kidney:  Measures 10.1 cm with uniform parenchymal echogenicity.  No hydronephrosis.  Abdominal aorta:  Distal abdominal aorta is obscured by bowel gas. There is atherosclerotic plaque.  No aneurysm in the visualized portions.  Additional finding:  Small right pleural effusion.  IMPRESSION:  1.  Cholelithiasis with edema in the gallbladder fossa and gallbladder wall thickening.  Although no sonographic Murphy's sign is present, acute cholecystitis is considered. 2.  Mild intrahepatic biliary duct dilatation. 3.  Small right pleural effusion.  Original Report Authenticated By: Reyes Ivan, M.D.   Ct Abdomen Pelvis W Contrast  09/23/2011  *RADIOLOGY REPORT*  Clinical Data: Abdominal pain, nausea and vomiting, history of Huntington's disease, hysterectomy, appendectomy  CT ABDOMEN AND PELVIS WITH CONTRAST  Technique:  Multidetector CT imaging of the abdomen and pelvis was performed following the standard protocol during bolus administration of intravenous contrast.  Contrast:  75 ml Omnipaque-300  Comparison: CT abdomen pelvis - 12/07/2002; pelvic ultrasound - 06/01/2007.  Findings:  Normal hepatic contour.  7 mm hypoattenuating lesion within the right lobe of the liver (image 6, series 2) is too small to accurately characterize though favored to represent a hepatic cyst. The gallbladder and cystic duct are distended (the cystic duct measures approximately 1.3 mm in greatest coronal dimension - image 16, series five), however there is no gallbladder wall thickening or pericholecystic fluid.  No radiopaque  gallstones.  The common bile duct is of normal caliber measuring 6 mm in diameter (image 33, series 2). No ascites.  There is symmetric enhancement and excretion of the bilateral kidneys.  No urinary obstruction.  No renal lesions.  The bilateral adrenal glands, pancreas and spleen are normal.  Ingested enteric contrast extends to the level of the mid small bowel.  There is mild dilatation of the proximal small bowel with index loop within the left mid hemiabdomen measuring 2.6 mm in diameter (image 46, series 2) without definite evidence of enteric obstruction.  Moderate colonic stool burden.  Scattered colonic diverticulosis without evidence of diverticulitis.  No pneumoperitoneum, pneumatosis or portal  venous gas.  Scattered atherosclerotic calcifications of a normal caliber abdominal aorta.  The major branch vessels of the abdominal aorta, including the IMA, are patent.  No retroperitoneal, mesenteric, pelvic or inguinal lymphadenopathy.  Interval development of bilateral cystic adnexal lesions with left adnexal lesion measuring approximately 4.1 x 2.4 cm (image 73, series 2) and right adnexal lesion measuring approximate 1.8 x 2.1 cm.  Post hysterectomy.  Surgical clips are again seen within the presacral space.  No free fluid in the pelvis.  Limited visualization of the lower thorax demonstrates small bilateral pleural effusions and bibasilar opacities, possibly atelectasis.  Possible air space opacity within the medial segment of the right middle lobe (image six, series four) worrisome for infection.  There is a possible 1.0 x 0.6 cm nodule within the basilar aspect of the right lower lobe (image five), not definitely seen on prior remote examination.  Stable findings of pectus deformity of the visualized thorax.  No acute aggressive osseous abnormalities.  Degenerative change of the L5 - S1.  IMPRESSION: 1.  Mild dilatation of the proximal small bowel without definite evidence of enteric obstruction. 2.  The  gallbladder and cystic duct are distended but otherwise normal.  This may be secondary to reported history of patient's fasting state.  Further evaluation may be obtained with a gallbladder ultrasound as clinically indicated. 3.  Interval development of bilateral cystic adnexal lesions which is an atypical finding in this postmenopausal female.  Further evaluation with pelvic ultrasound may be obtained as clinically indicated.  4.  Colonic diverticulosis without evidence of diverticulitis.  5. Small bilateral effusions with possible airspace opacities within the middle lobe, worrisome for infection.  Clinical correlation is advised. 6.  Possible 1 cm nodule within the basilar aspect of the right lower lobe.  Further evaluation with dedicated chest CT may be obtained as clinically indicated.  Original Report Authenticated By: Waynard Reeds, M.D.    Brief H and P: For complete details please refer to admission H and P, but in brief  77 yo WF with Huntington disease who was admitted yesterday with nausea, vomiting, abdominal pain, leukocytosis, aspiration pneumonia, and failure to thrive was found to have radiological evidence of acute cholecystitis. Pt had a change in her chronic medical condition around three weeks ago when she started developing intermittent nausea, vomiting and vertigo. She was diagnosed with an UTI and given a course of antibiotics.   Patient Active Hospital Problem List: Cholecystitis with cholelithiasis (09/24/2011) S/p cholecystostomy tube placed by IR. Surgery will reevaluate at later date as outpatient for possible surgical intervention. POA was told that patient would require an open procedure as opposed to laparoscopy. Felt to be high rist due to Huntington's disease and will reassess in 4-6 weeks.  Patient's POA is to be educated in drainage of biliary tube prior to discharge and is to be done 3 times per day.  Home hospice to help once patient is transitioned over.  Patient  denies any nausea currently.  Aspiration pneumonia (09/23/2011) Pt is currently on Levaquin and has improved on this regimen.  CBC has normalized.  Patient has received seven days of antibiotics.  Will plan on patient completing a 12 day course total.  Yesterday's 1 view portable chest x ray read as Persistent left lower lobe opacity. Postobstructive process cannot be excluded. CT may be helpful for further evaluation.  Thus will order CT with contrast today.  This will not hold up discharge and results can be obtained as  outpatient for further evaluation and medical decision making by POA and primary care physician pending results.  Pt is currently a DNR and was not a surgical candidate recently for a cholecystectomy reportedly.   Huntington's disease (09/23/2011) Patient is currently listed as comfort care. POA aware that this is progressive disease.   Atrial fibrillation (09/23/2011) rate controlled. Continue Digoxin. Not on anticoagulation.   Hypothyroidism:  Will continue home regimen on discharge.  Vertigo (09/24/2011) Stable patient is currently on meclizine.  Otherwise will continue home regimen for her other comorbidities.  Addendum:  Disposition to home with home hospice and with 3 times per day drain flushes with follow up with PCP for further follow up concerning recent chest x ray findings of LLL opacity (CT ordered today and POA aware) and Surgery for reevaluation given her recent bout of cholecystitis (please see disposition and followup above).   Time spent on Discharge: 50 min.  SignedPenny Pia [facility] Pager: [phone] 10/02/2011, 11:25 AM

## 2011-10-02 NOTE — Progress Notes (Signed)
PTAR called for transportation home for patient. PTAR form in [place].   Unice Bailey, LCSWA [phone]

## 2011-10-29 ENCOUNTER — Telehealth (INDEPENDENT_AMBULATORY_CARE_PROVIDER_SITE_OTHER): Payer: Self-pay

## 2011-10-29 NOTE — Telephone Encounter (Signed)
Called worried that drainage changes from clear/reddish drainage to green. I told her that I was sure that was normal for GB drain but I would talk to urge dr to make sure. Called patient back and told her doctor agreed it was normal.

## 2011-11-07 ENCOUNTER — Encounter (INDEPENDENT_AMBULATORY_CARE_PROVIDER_SITE_OTHER): Payer: Self-pay | Admitting: General Surgery

## 2011-11-07 ENCOUNTER — Ambulatory Visit (INDEPENDENT_AMBULATORY_CARE_PROVIDER_SITE_OTHER): Payer: Medicare Other | Admitting: General Surgery

## 2011-11-07 VITALS — BP 114/78 | HR 118 | Temp 99.3°F | Resp 20 | Ht 66.0 in | Wt 115.8 lb

## 2011-11-07 DIAGNOSIS — K8010 Calculus of gallbladder with chronic cholecystitis without obstruction: Secondary | ICD-10-CM

## 2011-11-07 NOTE — Progress Notes (Addendum)
Chief complaint: Followup regarding gallbladder  Procedure: Status post placement of a percutaneous cholecystostomy tube by interventional radiology on February 28  History of Present Ilness: [name] with a history of Huntington's disease comes in for followup from her recent hospitalization. She is in the hospital from February 26 through March 7. She was admitted after having several weeks of nonspecific abdominal pains along with nausea and vomiting. She was found to have aspiration pneumonia as well as acute cholecystitis. Because it was unknown how long she had been symptomatic with respect to her gallbladder, her aspiration pneumonia, and her underlying Huntington's disease, it was recommended that a percutaneous cholecystostomy tube be placed. She has done well since discharge. She is still not as functional as she was pre-hospital. She is mainly sitting up in a chair. Before being in the hospital she was ambulatory with assistance. Her caregiver denies any fever, chills, vomiting, nausea, and diarrhea or constipation. Her caregiver states that she has had an excellent appetite. She has had not much drainage from the cholecystostomy tube. They empty it every several days. Initially they report that the drainage was clear, and now it is green tinged. She states that the patient only complains about some soreness on the right side when she is moved or rotated. She is currently on an ABX for a UTI  Physical Exam: BP 114/78  Pulse 118  Temp(Src) 99.3 F (37.4 C) (Temporal)  Resp 20  Ht 5\' 6"  (1.676 m)  Wt 115 lb 12.8 oz (52.527 kg)  BMI 18.69 kg/m2  wf in wheelchair in NAD +chorea Lungs clear Heart reg abd-soft, nontender, does not grimace on palp. Cholecystostomy tube in place and drain site ok. Some bile in bag No jaundice/scleral icterus.   Assessment and Plan: This 77 year old female with progressive Huntington's disease with a history of acute cholecystitis  status post percutaneous placement of a cholecystostomy tube which has been in place for 6 weeks  She has an appointment with her primary care physician next week and a followup appointment with her Huntington's disease physician at [facility] the week after next.  As we have done on previous occasions we discussed options for long-term management. Options include removal of the cholecystostomy tube as long as there is a normal cholangiogram prior to drain removal versus delayed cholecystectomy versus leaving the tube indefinitely.  We discussed the pros and cons of each approach. I am not too keen on removal of the cholecystostomy tube because of the risk of recurrent cholecystitis. She would obviously be at high risk surgically for cholecystectomy. She would be at an increased risk for pneumonia and ventilator-dependent respiratory failure. In my opinion, there are very few cons from leaving the tube in indefinitely.  I would not entertain surgery until at least 12 weeks after drain placement, and she achieved her prehospitalization functional status. It also had to be no evidence of pneumonia or active infiltrate on chest x-ray.  I proposed that she discuss her gallbladder with her primary care physician and her Huntington's disease physician at her upcoming appointments. I did also of her a referral to a tertiary Medical Center for a second opinion. She would like to hold on that until she has discussed it with Sydney [name] and her Huntington's disease physician- Sydney [name] at [facility]  Followup 6 weeks  Sydney [name]. Sydney Campanile, MD, FACS General, Bariatric, & Minimally Invasive Surgery [facility], [place]

## 2011-11-11 ENCOUNTER — Telehealth (INDEPENDENT_AMBULATORY_CARE_PROVIDER_SITE_OTHER): Payer: Self-pay | Admitting: General Surgery

## 2011-11-11 NOTE — Telephone Encounter (Signed)
Friday visit note faxed to Dr Peterson Lombard at [facility] per [facility] 11/10/11.

## 2011-11-20 ENCOUNTER — Emergency Department (HOSPITAL_COMMUNITY)
Admission: EM | Admit: 2011-11-20 | Discharge: 2011-11-21 | Disposition: A | Discharge status: Home / Self Care | Payer: Medicare Other | Attending: Emergency Medicine | Admitting: Emergency Medicine

## 2011-11-20 ENCOUNTER — Encounter (HOSPITAL_COMMUNITY): Payer: Self-pay

## 2011-11-20 DIAGNOSIS — Z79899 Other long term (current) drug therapy: Secondary | ICD-10-CM | POA: Insufficient documentation

## 2011-11-20 DIAGNOSIS — F0390 Unspecified dementia without behavioral disturbance: Secondary | ICD-10-CM | POA: Insufficient documentation

## 2011-11-20 DIAGNOSIS — R109 Unspecified abdominal pain: Secondary | ICD-10-CM | POA: Insufficient documentation

## 2011-11-20 DIAGNOSIS — K219 Gastro-esophageal reflux disease without esophagitis: Secondary | ICD-10-CM | POA: Insufficient documentation

## 2011-11-20 DIAGNOSIS — E079 Disorder of thyroid, unspecified: Secondary | ICD-10-CM | POA: Insufficient documentation

## 2011-11-20 DIAGNOSIS — G10 Huntington's disease: Secondary | ICD-10-CM | POA: Insufficient documentation

## 2011-11-20 DIAGNOSIS — K8010 Calculus of gallbladder with chronic cholecystitis without obstruction: Secondary | ICD-10-CM | POA: Insufficient documentation

## 2011-11-20 NOTE — ED Notes (Signed)
YQM:[id number]<ON> Expected date:11/20/11<BR> Expected time:<BR> Means of arrival:<BR> Comments:<BR> EMS 231 GC - peg tube eval

## 2011-11-20 NOTE — ED Notes (Signed)
Spoke with Eunice Blase, RN at [facility] [phone]

## 2011-11-20 NOTE — ED Notes (Signed)
Pt had a drainage tube placed in her gallbladder about two weeks ago and tonight they flushed it and it returned a lot of bright red blood, there is mild bleeding around the sight

## 2011-11-21 ENCOUNTER — Emergency Department (HOSPITAL_COMMUNITY): Payer: Medicare Other

## 2011-11-21 LAB — COMPREHENSIVE METABOLIC PANEL
AST: 8 U/L (ref 0–37)
Albumin: 2.7 g/dL — ABNORMAL LOW (ref 3.5–5.2)
CO2: 28 mEq/L (ref 19–32)
Calcium: 8.5 mg/dL (ref 8.4–10.5)
Creatinine, Ser: 0.62 mg/dL (ref 0.50–1.10)
GFR calc non Af Amer: 86 mL/min — ABNORMAL LOW (ref >90)

## 2011-11-21 LAB — CBC
HCT: 37.9 % (ref 36.0–46.0)
MCH: 30.2 pg (ref 26.0–34.0)
MCHC: 33.5 g/dL (ref 30.0–36.0)
MCV: 90 fL (ref 78.0–100.0)
RDW: 13.3 % (ref 11.5–15.5)

## 2011-11-21 LAB — URINALYSIS, ROUTINE W REFLEX MICROSCOPIC
Specific Gravity, Urine: 1.026 (ref 1.005–1.030)
Urobilinogen, UA: 0.2 mg/dL (ref 0.0–1.0)

## 2011-11-21 LAB — DIFFERENTIAL
Basophils Absolute: 0 10*3/uL (ref 0.0–0.1)
Basophils Relative: 0 % (ref 0–1)
Eosinophils Relative: 4 % (ref 0–5)
Monocytes Absolute: 0.8 10*3/uL (ref 0.1–1.0)

## 2011-11-21 LAB — URINE MICROSCOPIC-ADD ON

## 2011-11-21 MED ORDER — FENTANYL CITRATE 0.05 MG/ML IJ SOLN
INTRAMUSCULAR | Status: AC
  Administered 2011-11-21: 25 ug via INTRAVENOUS
  Filled 2011-11-21: qty 2

## 2011-11-21 MED ORDER — HYDROCODONE-ACETAMINOPHEN 5-500 MG PO TABS: 1.0000 | ORAL_TABLET | Freq: Four times a day (QID) | ORAL | Status: AC | PRN

## 2011-11-21 MED ORDER — FENTANYL CITRATE 0.05 MG/ML IJ SOLN
25.0000 ug | INTRAMUSCULAR | Status: DC | PRN
  Administered 2011-11-21: 25 ug via INTRAVENOUS

## 2011-11-21 NOTE — Discharge Instructions (Signed)
Please followup with your surgeon for recheck of your percutaneous drain. Return to the emergency room for worsening pain, fever, vomiting or worsening redness or bleeding through the tube.  Take medications as prescribed  Gallbladder Disease Gallbladder disease (cholecystitis) is an inflammation of your gallbladder. It is usually caused by a build-up of stones (gallstones) or sludge (cholelithiasis) in your gallbladder. The gallbladder is not an essential organ. It is located slightly to the right of center in the belly (abdomen), behind the liver. It stores bile made in the liver. Bile aids in digestion of fats. Gallbladder disease may result in nausea (feeling sick to your stomach), abdominal pain, and jaundice. In severe cases, emergency surgery may be required. The most common type of gallbladder disease is gallstones. They begin as small crystals and slowly grow into stones. Gallstone pain occurs when the bile duct has spasms. The spasms are caused by the stone passing out of the duct. The stone is trying to pass at the same time bile is passing into the small bowel for digestion. The pain usually begins suddenly. It may persist from several minutes to several hours. Infection can occur. Infection can add to discomfort and severity of an acute attack. The pain may be made worse by breathing deeply or by being jarred. There may be fever and tenderness to the touch. In some cases, when gallstones do not move into the bile duct, people have no pain or symptoms. These are called "silent" gallstones. Women are three times more likely to develop gallstones than men. Women who have had several pregnancies are more likely to have gallbladder disease. Physicians sometimes advise removing diseased gallbladders before future pregnancies. Other factors that increase the risk of gallbladder disease are obesity, diets heavy in fried foods and dairy products, increasing age, prolonged use of medications containing  female hormones, and heredity. HOME CARE INSTRUCTIONS   If your physician prescribed an antibiotic, take as directed.   Only take over-the-counter or prescription medicines for pain, discomfort, or fever as directed by your caregiver.   Follow a low fat diet until seen again. (Fat causes the gallbladder to contract.)   Follow-up as instructed. Attacks are almost always recurrent and surgery is usually required for permanent treatment.  SEEK IMMEDIATE MEDICAL CARE IF:   Pain is increasing and not controlled by medications.   The pain moves to another part of your abdomen or to your back. (Right sided pain can be appendicitis and left sided pain in adults can be diverticulitis).   You have a fever.   You develop nausea and vomiting.  Document Released: 07/14/2005 Document Revised: 07/03/2011 Document Reviewed: 05/30/2011 [facility] Patient Information 2012 [place], [place].

## 2011-11-21 NOTE — ED Notes (Signed)
Patient transported to [facility] 

## 2011-11-21 NOTE — ED Provider Notes (Signed)
History     CSN: [id number]  Arrival date & time 11/20/11  2257   First MD Initiated Contact with Patient 11/21/11 0009      Chief Complaint  Patient presents with  . Abdominal Pain    (Consider location/radiation/quality/duration/timing/severity/associated sxs/prior treatment) Patient is a 77 y.o. female presenting with abdominal pain.  Abdominal Pain The primary symptoms of the illness include abdominal pain.   77 year old female presents to emergency department with complaints of blood coming from her gallbladder drain and pain. Patient with history of Huntington's, has had drain placed in her gallbladder 2 months ago for cholecystitis. Tonight daughter noted blood around the site of the tube and blood in the tube. She was instructed by the hospice nurse to flush it and after flushing had drainage of yellow fluid with a little bit of blood. Patient has had intermittent pain to the area over the last few days. Patient recently seen by her surgeon who is wanting to keep the drain in place. There are looking to get a second opinion to have her gallbladder removed. No fevers no vomiting. Patient not on any pain medicine at this time Past Medical History  Diagnosis Date  . Huntington disease   . Atrial fibrillation   . Chronic respiratory failure     nocturnal oxygen  . Complication of anesthesia     felt surgery with epidural  . GERD (gastroesophageal reflux disease)   . Thyroid disease     Past Surgical History  Procedure Date  . Colon surgery   . Abdominal hysterectomy   . Hemorrhoid surgery   . Tonsillectomy   . Appendectomy     Family History  Problem Relation Age of Onset  . Huntington's disease    . Diabetes type II    . Coronary artery disease    . Heart disease Mother   . Stroke Father   . Heart disease Brother     History  Substance Use Topics  . Smoking status: Former Smoker    Quit date: 09/22/1998  . Smokeless tobacco: Never Used  . Alcohol Use: No      OB History    Grav Para Term Preterm Abortions TAB SAB Ect Mult Living                  Review of Systems  Unable to perform ROS: Dementia  Gastrointestinal: Positive for abdominal pain.    Allergies  Codeine and Penicillins cross reactors  Home Medications   Current Outpatient Rx  Name Route Sig Dispense Refill  . ALPRAZOLAM 0.25 MG PO TABS Oral Take 0.25 mg by mouth every 8 (eight) hours as needed. For anxiety     . ASPIRIN EC 81 MG PO TBEC Oral Take 81 mg by mouth daily.      Marland Kitchen CLONAZEPAM 0.5 MG PO TABS Oral Take 1 mg by mouth at bedtime.     Marland Kitchen DIGOXIN 0.25 MG PO TABS Oral Take 250 mcg by mouth daily.      Marland Kitchen ESTROGENS CONJUGATED 0.625 MG PO TABS Oral Take 0.625 mg by mouth See admin instructions. Taken daily for first 25 days of each month.    . FLUOXETINE HCL 20 MG PO CAPS Oral Take 20 mg by mouth daily.      Marland Kitchen HALOPERIDOL 0.5 MG PO TABS Oral Take 0.5 mg by mouth 2 (two) times daily.      . IBUPROFEN 800 MG PO TABS Oral Take 800 mg by mouth every 8 (eight)  hours as needed. For pain     . LANSOPRAZOLE 30 MG PO CPDR Oral Take 30 mg by mouth 2 (two) times daily.    Marland Kitchen LEVOTHYROXINE SODIUM 50 MCG PO TABS Oral Take 50 mcg by mouth daily.      Marland Kitchen MECLIZINE HCL 25 MG PO TABS Oral Take 25 mg by mouth 3 (three) times daily as needed. For dizziness/nausea.    Marland Kitchen NITROGLYCERIN 0.4 MG SL SUBL Sublingual Place 0.4 mg under the tongue every 5 (five) minutes x 3 doses as needed.    Marland Kitchen ONDANSETRON 8 MG PO TBDP Oral Take 1 tablet (8 mg total) by mouth every 8 (eight) hours as needed for nausea. For nausea. 20 tablet 1  . POLYETHYLENE GLYCOL 3350 PO PACK Oral Take 17 g by mouth daily as needed. For constipation.    Marland Kitchen PROMETHAZINE HCL 25 MG RE SUPP Rectal Place 25 mg rectally every 8 (eight) hours as needed. For nausea.    . SCOPOLAMINE BASE 1.5 MG TD PT72 Transdermal Place 1 patch onto the skin every 3 (three) days.    Bernadette Hoit SODIUM 8.6-50 MG PO TABS Oral Take 2 tablets by mouth  2 (two) times daily.      BP 130/66  Pulse 70  Temp(Src) 97.8 F (36.6 C) (Oral)  Resp 20  SpO2 94%  Physical Exam  Nursing note and vitals reviewed. Constitutional: She appears well-developed and well-nourished. No distress.  HENT:  Head: Normocephalic and atraumatic.  Eyes: EOM are normal. Pupils are equal, round, and reactive to light.  Neck: Normal range of motion. Neck supple. No JVD present. No tracheal deviation present. No thyromegaly present.  Cardiovascular: Normal rate, regular rhythm, normal heart sounds and intact distal pulses.  Exam reveals no gallop and no friction rub.   No murmur heard. Pulmonary/Chest: Effort normal and breath sounds normal. No stridor. No respiratory distress. She has no wheezes. She has no rales. She exhibits no tenderness.  Abdominal: Soft. Bowel sounds are normal. She exhibits no distension and no mass. There is tenderness (diffuse tenderness across the abdomen more in the right upper quadrant). There is no rebound and no guarding.       Drain noted coming from right upper quadrant. Area around drain slightly erythematous without fluctuance or drainage of pus. Scant amount of streaks of blood in noted in tube and in bag with mainly serosanguinous yellow fluid  Lymphadenopathy:    She has no cervical adenopathy.    ED Course  Procedures (including critical care time)  Labs Reviewed  CBC - Abnormal; Notable for the following:    WBC 10.7 (*)    All other components within normal limits  DIFFERENTIAL  COMPREHENSIVE METABOLIC PANEL  URINALYSIS, ROUTINE W REFLEX MICROSCOPIC  URINE CULTURE   US Abdomen Limited  11/21/2011  *RADIOLOGY REPORT*  Clinical Data:  Placement of gallbladder stents.  Gallbladder drain for 2 months.  LIMITED ABDOMINAL ULTRASOUND - RIGHT UPPER QUADRANT  Comparison:  CT 02/26/1925 13  Findings:  Gallbladder:  Right upper quadrant gallbladder stent is visualized. The gallbladder is contracted.  This results in a wall echo  shadow complex.  This could be due to gas within the stent.  Stones are not excluded.  No gallbladder distension.  Common bile duct:  Normal caliber, with measured diameter of about 2 mm.  Liver:  Homogeneous liver parenchymal echotexture.  No focal mass lesions demonstrated.  IMPRESSION: Gallbladder stent is visualized.  The gallbladder is contracted with wall echo shadow  complex, possibly due to gas within the stent or to stones.  No gallbladder distension or bile duct dilatation.                   Original Report Authenticated By: Marlon Pel, M.D.     1. Cholecystitis with cholelithiasis s/p percutaneous drain 2/28       MDM  77 year old female with percutaneous gallbladder drain with onset of blood noted in tube tonight. Family also concern for possible urinary tract infection due to strong smell in patient's diaper. She has had no fevers. Per Dr. Tawana Scale most recent clinic note, patient is not expected to be a good surgical candidate for cholecystectomy given her comorbidities. We'll check for drain placement with ultrasound, check baseline labs and treat for pain        Olivia Mackie, MD 11/21/11 [phone]

## 2011-11-21 NOTE — ED Notes (Signed)
Family states bilary tube placed in Feb. Pt has yellowish drainage in bilary tube with tinges of blood. Family states pt cried out in pain when they attempted to flush tube. Pt presently c/o pain "all over" but worse on R side near drainage tube.

## 2011-11-21 NOTE — ED Notes (Signed)
MD at bedside.  Dr. Otter at bedside 

## 2011-11-21 NOTE — ED Notes (Signed)
[facility] called to come take pt back to her residence. Pt's family at bedside.

## 2011-11-21 NOTE — ED Notes (Signed)
Patient returned from [facility].

## 2011-11-22 LAB — URINE CULTURE: Culture: NO GROWTH

## 2011-11-24 ENCOUNTER — Inpatient Hospital Stay (HOSPITAL_COMMUNITY)
Admission: EM | Admit: 2011-11-24 | Discharge: 2011-12-03 | DRG: 418 | Disposition: A | Discharge status: Hospice | Payer: Medicare Other | Attending: Internal Medicine | Admitting: Internal Medicine

## 2011-11-24 ENCOUNTER — Telehealth (INDEPENDENT_AMBULATORY_CARE_PROVIDER_SITE_OTHER): Payer: Self-pay | Admitting: General Surgery

## 2011-11-24 ENCOUNTER — Encounter (HOSPITAL_COMMUNITY): Payer: Self-pay | Admitting: *Deleted

## 2011-11-24 DIAGNOSIS — K5900 Constipation, unspecified: Secondary | ICD-10-CM | POA: Diagnosis present

## 2011-11-24 DIAGNOSIS — E876 Hypokalemia: Secondary | ICD-10-CM | POA: Diagnosis present

## 2011-11-24 DIAGNOSIS — N390 Urinary tract infection, site not specified: Secondary | ICD-10-CM | POA: Diagnosis present

## 2011-11-24 DIAGNOSIS — I4891 Unspecified atrial fibrillation: Secondary | ICD-10-CM | POA: Diagnosis present

## 2011-11-24 DIAGNOSIS — B961 Klebsiella pneumoniae [K. pneumoniae] as the cause of diseases classified elsewhere: Secondary | ICD-10-CM | POA: Diagnosis present

## 2011-11-24 DIAGNOSIS — G10 Huntington's disease: Secondary | ICD-10-CM | POA: Diagnosis present

## 2011-11-24 DIAGNOSIS — K219 Gastro-esophageal reflux disease without esophagitis: Secondary | ICD-10-CM | POA: Diagnosis present

## 2011-11-24 DIAGNOSIS — Z87440 Personal history of urinary (tract) infections: Secondary | ICD-10-CM

## 2011-11-24 DIAGNOSIS — A498 Other bacterial infections of unspecified site: Secondary | ICD-10-CM | POA: Diagnosis present

## 2011-11-24 DIAGNOSIS — N12 Tubulo-interstitial nephritis, not specified as acute or chronic: Secondary | ICD-10-CM | POA: Diagnosis present

## 2011-11-24 DIAGNOSIS — Z87891 Personal history of nicotine dependence: Secondary | ICD-10-CM

## 2011-11-24 DIAGNOSIS — K8010 Calculus of gallbladder with chronic cholecystitis without obstruction: Secondary | ICD-10-CM

## 2011-11-24 DIAGNOSIS — Z9049 Acquired absence of other specified parts of digestive tract: Secondary | ICD-10-CM

## 2011-11-24 DIAGNOSIS — E039 Hypothyroidism, unspecified: Secondary | ICD-10-CM | POA: Diagnosis present

## 2011-11-24 DIAGNOSIS — J9610 Chronic respiratory failure, unspecified whether with hypoxia or hypercapnia: Secondary | ICD-10-CM | POA: Diagnosis present

## 2011-11-24 DIAGNOSIS — K660 Peritoneal adhesions (postprocedural) (postinfection): Secondary | ICD-10-CM | POA: Diagnosis present

## 2011-11-24 LAB — LIPASE, BLOOD: Lipase: 9 U/L — ABNORMAL LOW (ref 11–59)

## 2011-11-24 LAB — URINALYSIS, ROUTINE W REFLEX MICROSCOPIC
Glucose, UA: NEGATIVE mg/dL
Ketones, ur: NEGATIVE mg/dL
Nitrite: POSITIVE — AB
Protein, ur: 30 mg/dL — AB

## 2011-11-24 LAB — COMPREHENSIVE METABOLIC PANEL
Alkaline Phosphatase: 115 U/L (ref 39–117)
BUN: 20 mg/dL (ref 6–23)
CO2: 28 mEq/L (ref 19–32)
Chloride: 102 mEq/L (ref 96–112)
Creatinine, Ser: 0.53 mg/dL (ref 0.50–1.10)
GFR calc non Af Amer: >90 mL/min (ref >90)
Total Bilirubin: 0.8 mg/dL (ref 0.3–1.2)

## 2011-11-24 LAB — DIFFERENTIAL
Lymphocytes Relative: 6 % — ABNORMAL LOW (ref 12–46)
Lymphs Abs: 1 10*3/uL (ref 0.7–4.0)
Monocytes Absolute: 1.2 10*3/uL — ABNORMAL HIGH (ref 0.1–1.0)
Monocytes Relative: 7 % (ref 3–12)
Neutro Abs: 15 10*3/uL — ABNORMAL HIGH (ref 1.7–7.7)

## 2011-11-24 LAB — CBC
HCT: 40 % (ref 36.0–46.0)
Hemoglobin: 13.1 g/dL (ref 12.0–15.0)
RBC: 4.43 MIL/uL (ref 3.87–5.11)
WBC: 17.3 10*3/uL — ABNORMAL HIGH (ref 4.0–10.5)

## 2011-11-24 LAB — URINE MICROSCOPIC-ADD ON

## 2011-11-24 MED ORDER — NITROGLYCERIN 0.4 MG SL SUBL: 0.4000 mg | SUBLINGUAL_TABLET | SUBLINGUAL | Status: DC | PRN

## 2011-11-24 MED ORDER — DIGOXIN 250 MCG PO TABS
250.0000 ug | ORAL_TABLET | Freq: Every day | ORAL | Status: DC
Start: 2011-11-24 — End: 2011-12-03
  Administered 2011-11-24 – 2011-12-03 (×9): 250 ug via ORAL
  Filled 2011-11-24 (×11): qty 1

## 2011-11-24 MED ORDER — CIPROFLOXACIN IN D5W 400 MG/200ML IV SOLN
400.0000 mg | Freq: Two times a day (BID) | INTRAVENOUS | Status: DC
  Administered 2011-11-24 – 2011-11-28 (×8): 400 mg via INTRAVENOUS
  Filled 2011-11-24 (×8): qty 200

## 2011-11-24 MED ORDER — DEXTROSE-NACL 5-0.9 % IV SOLN
INTRAVENOUS | Status: DC
  Administered 2011-11-24 – 2011-11-27 (×2): via INTRAVENOUS

## 2011-11-24 MED ORDER — PANTOPRAZOLE SODIUM 40 MG PO TBEC
40.0000 mg | DELAYED_RELEASE_TABLET | Freq: Every day | ORAL | Status: DC
  Administered 2011-11-24 – 2011-11-27 (×3): 40 mg via ORAL
  Filled 2011-11-24 (×5): qty 1

## 2011-11-24 MED ORDER — FLEET ENEMA 7-19 GM/118ML RE ENEM: 1.0000 | ENEMA | Freq: Once | RECTAL | Status: AC | PRN

## 2011-11-24 MED ORDER — DOCUSATE SODIUM 100 MG PO CAPS
100.0000 mg | ORAL_CAPSULE | Freq: Two times a day (BID) | ORAL | Status: DC
  Administered 2011-11-24 – 2011-11-27 (×7): 100 mg via ORAL
  Filled 2011-11-24 (×9): qty 1

## 2011-11-24 MED ORDER — HEPARIN SODIUM (PORCINE) 5000 UNIT/ML IJ SOLN
5000.0000 [IU] | Freq: Three times a day (TID) | INTRAMUSCULAR | Status: DC
  Administered 2011-11-24 – 2011-12-03 (×24): 5000 [IU] via SUBCUTANEOUS
  Filled 2011-11-24 (×32): qty 1

## 2011-11-24 MED ORDER — CIPROFLOXACIN HCL 500 MG PO TABS
500.0000 mg | ORAL_TABLET | Freq: Once | ORAL | Status: AC
  Administered 2011-11-24: 500 mg via ORAL
  Filled 2011-11-24: qty 1

## 2011-11-24 MED ORDER — METRONIDAZOLE IN NACL 5-0.79 MG/ML-% IV SOLN
500.0000 mg | Freq: Three times a day (TID) | INTRAVENOUS | Status: DC
  Administered 2011-11-24 – 2011-11-25 (×2): 500 mg via INTRAVENOUS
  Filled 2011-11-24 (×4): qty 100

## 2011-11-24 MED ORDER — ONDANSETRON 8 MG PO TBDP
8.0000 mg | ORAL_TABLET | Freq: Three times a day (TID) | ORAL | Status: DC | PRN
  Filled 2011-11-24: qty 1

## 2011-11-24 MED ORDER — SODIUM CHLORIDE 0.9 % IJ SOLN
3.0000 mL | Freq: Two times a day (BID) | INTRAMUSCULAR | Status: DC
  Administered 2011-11-24 – 2011-11-27 (×5): 3 mL via INTRAVENOUS
  Administered 2011-11-27: 09:00:00 via INTRAVENOUS

## 2011-11-24 MED ORDER — HALOPERIDOL 0.5 MG PO TABS
0.5000 mg | ORAL_TABLET | Freq: Two times a day (BID) | ORAL | Status: DC
  Administered 2011-11-24 – 2011-12-03 (×17): 0.5 mg via ORAL
  Filled 2011-11-24 (×21): qty 1

## 2011-11-24 MED ORDER — HYDROCODONE-ACETAMINOPHEN 5-325 MG PO TABS
2.0000 | ORAL_TABLET | Freq: Once | ORAL | Status: AC
  Administered 2011-11-24: 2 via ORAL
  Filled 2011-11-24: qty 2

## 2011-11-24 MED ORDER — FLUOXETINE HCL 20 MG PO CAPS
20.0000 mg | ORAL_CAPSULE | Freq: Every day | ORAL | Status: DC
  Administered 2011-11-24 – 2011-12-03 (×9): 20 mg via ORAL
  Filled 2011-11-24 (×11): qty 1

## 2011-11-24 MED ORDER — ONDANSETRON HCL 4 MG/2ML IJ SOLN
4.0000 mg | Freq: Four times a day (QID) | INTRAMUSCULAR | Status: DC | PRN
  Administered 2011-11-28 – 2011-12-02 (×3): 4 mg via INTRAVENOUS
  Filled 2011-11-24 (×3): qty 2

## 2011-11-24 MED ORDER — HYDROMORPHONE HCL PF 1 MG/ML IJ SOLN
0.5000 mg | INTRAMUSCULAR | Status: DC | PRN
  Administered 2011-11-25 – 2011-11-27 (×3): 0.5 mg via INTRAVENOUS
  Filled 2011-11-24 (×4): qty 1

## 2011-11-24 MED ORDER — ONDANSETRON HCL 4 MG PO TABS: 4.0000 mg | ORAL_TABLET | Freq: Four times a day (QID) | ORAL | Status: DC | PRN

## 2011-11-24 MED ORDER — ALPRAZOLAM 0.25 MG PO TABS
0.2500 mg | ORAL_TABLET | Freq: Three times a day (TID) | ORAL | Status: DC | PRN
  Administered 2011-11-25 – 2011-11-30 (×4): 0.25 mg via ORAL
  Filled 2011-11-24 (×4): qty 1

## 2011-11-24 MED ORDER — LEVOTHYROXINE SODIUM 50 MCG PO TABS
50.0000 ug | ORAL_TABLET | Freq: Every day | ORAL | Status: DC
  Administered 2011-11-24 – 2011-12-03 (×9): 50 ug via ORAL
  Filled 2011-11-24 (×11): qty 1

## 2011-11-24 MED ORDER — ASPIRIN EC 81 MG PO TBEC
81.0000 mg | DELAYED_RELEASE_TABLET | Freq: Every day | ORAL | Status: DC
  Administered 2011-11-24 – 2011-11-27 (×4): 81 mg via ORAL
  Filled 2011-11-24 (×5): qty 1

## 2011-11-24 MED ORDER — SENNA 8.6 MG PO TABS
1.0000 | ORAL_TABLET | Freq: Two times a day (BID) | ORAL | Status: DC
  Administered 2011-11-24 – 2011-11-27 (×7): 8.6 mg via ORAL
  Filled 2011-11-24 (×7): qty 1

## 2011-11-24 NOTE — ED Notes (Signed)
Per pt's daughter states pt has been having abdominal pain that started last week. Pt was seen on the 4/26 for uti. Pt is c/o right upper quad pain and has gallbadder drain. Pt also states she has had generalized body aches but is not having body aches at this time.

## 2011-11-24 NOTE — ED Notes (Signed)
edmd notified pt is requesting pain medicaiton

## 2011-11-24 NOTE — ED Notes (Signed)
Per ems: pt is having abdominal pain with flushing jp drain. Pt also c/o body aches.

## 2011-11-24 NOTE — ED Provider Notes (Addendum)
History     CSN: [id number]  Arrival date & time 11/24/11  1505   First MD Initiated Contact with Patient 11/24/11 1528      Chief Complaint  Patient presents with  . Abdominal Pain    (Consider location/radiation/quality/duration/timing/severity/associated sxs/prior treatment) HPI Comments: Pt presents with abdominal pain.  Has hx of gallbladder drain in place since February, had cholecystitis, but was not felt to be a good surgical candidate per Dr. Andrey Campanile.  Per daughter, drain was not draining well over the weekend.  Pt has been complaining of increased pain to upper abdomen.  Has had some nausea, but no vomiting.  Has had some lower abd distension and some difficulty urinating with dark colored urine.  No fevers.  Was here last Thursday and had u/s of upper abdomen showing normal drain placement  Patient is a 77 y.o. female presenting with abdominal pain. The history is provided by the patient and a relative (patients daughter).  Abdominal Pain The primary symptoms of the illness include abdominal pain and nausea. The primary symptoms of the illness do not include fever, fatigue, shortness of breath, vomiting or diarrhea. The onset of the illness was gradual. The problem has been gradually worsening.  Symptoms associated with the illness do not include chills, diaphoresis, hematuria, frequency or back pain.    Past Medical History  Diagnosis Date  . Huntington disease   . Atrial fibrillation   . Chronic respiratory failure     nocturnal oxygen  . Complication of anesthesia     felt surgery with epidural  . GERD (gastroesophageal reflux disease)   . Thyroid disease     Past Surgical History  Procedure Date  . Colon surgery   . Abdominal hysterectomy   . Hemorrhoid surgery   . Tonsillectomy   . Appendectomy     Family History  Problem Relation Age of Onset  . Huntington's disease    . Diabetes type II    . Coronary artery disease    . Heart disease Mother   .  Stroke Father   . Heart disease Brother     History  Substance Use Topics  . Smoking status: Former Smoker    Quit date: 09/22/1998  . Smokeless tobacco: Never Used  . Alcohol Use: No    OB History    Grav Para Term Preterm Abortions TAB SAB Ect Mult Living                  Review of Systems  Constitutional: Negative for fever, chills, diaphoresis and fatigue.  HENT: Negative for congestion, rhinorrhea and sneezing.   Eyes: Negative.   Respiratory: Negative for cough, chest tightness and shortness of breath.   Cardiovascular: Negative for chest pain and leg swelling.  Gastrointestinal: Positive for nausea and abdominal pain. Negative for vomiting, diarrhea and blood in stool.  Genitourinary: Positive for decreased urine volume and difficulty urinating. Negative for frequency, hematuria and flank pain.  Musculoskeletal: Negative for back pain and arthralgias.  Skin: Negative for rash.  Neurological: Negative for dizziness, speech difficulty, weakness, numbness and headaches.    Allergies  Codeine and Penicillins cross reactors  Home Medications   Current Outpatient Rx  Name Route Sig Dispense Refill  . ALPRAZOLAM 0.25 MG PO TABS Oral Take 0.25 mg by mouth every 8 (eight) hours as needed. For anxiety     . ASPIRIN EC 81 MG PO TBEC Oral Take 81 mg by mouth daily.      Marland Kitchen CLONAZEPAM  0.5 MG PO TABS Oral Take 1 mg by mouth at bedtime.     Marland Kitchen DIGOXIN 0.25 MG PO TABS Oral Take 250 mcg by mouth daily.      Marland Kitchen ESTROGENS CONJUGATED 0.625 MG PO TABS Oral Take 0.625 mg by mouth See admin instructions. Taken daily for first 25 days of each month.    . FLUOXETINE HCL 20 MG PO CAPS Oral Take 20 mg by mouth daily.      Marland Kitchen HALOPERIDOL 0.5 MG PO TABS Oral Take 0.5 mg by mouth 2 (two) times daily.      Marland Kitchen HYDROCODONE-ACETAMINOPHEN 5-500 MG PO TABS Oral Take 1-2 tablets by mouth every 6 (six) hours as needed for pain. 15 tablet 0  . IBUPROFEN 800 MG PO TABS Oral Take 800 mg by mouth every 8  (eight) hours as needed. For pain     . LEVOTHYROXINE SODIUM 50 MCG PO TABS Oral Take 50 mcg by mouth daily.      Marland Kitchen MECLIZINE HCL 25 MG PO TABS Oral Take 25 mg by mouth 3 (three) times daily as needed. For dizziness/nausea.    Marland Kitchen NITROGLYCERIN 0.4 MG SL SUBL Sublingual Place 0.4 mg under the tongue every 5 (five) minutes x 3 doses as needed.    Marland Kitchen OMEPRAZOLE 20 MG PO CPDR Oral Take 20 mg by mouth daily.    Marland Kitchen ONDANSETRON 8 MG PO TBDP Oral Take 1 tablet (8 mg total) by mouth every 8 (eight) hours as needed for nausea. For nausea. 20 tablet 1  . POLYETHYLENE GLYCOL 3350 PO PACK Oral Take 17 g by mouth daily as needed. For constipation.    Marland Kitchen PROMETHAZINE HCL 25 MG RE SUPP Rectal Place 25 mg rectally every 8 (eight) hours as needed. For nausea.    Bernadette Hoit SODIUM 8.6-50 MG PO TABS Oral Take 2 tablets by mouth 2 (two) times daily.      BP 146/69  Pulse 80  Temp(Src) 98.6 F (37 C) (Oral)  Resp 17  SpO2 96%  Physical Exam  Constitutional: She is oriented to person, place, and time. She appears well-developed and well-nourished.  HENT:  Head: Normocephalic and atraumatic.  Eyes: Pupils are equal, round, and reactive to light.  Neck: Normal range of motion. Neck supple.  Cardiovascular: Normal rate, regular rhythm and normal heart sounds.   Pulmonary/Chest: Effort normal and breath sounds normal. No respiratory distress. She has no wheezes. She has no rales. She exhibits no tenderness.  Abdominal: Soft. Bowel sounds are normal. There is tenderness. There is no rebound and no guarding.       Mild diffuse TTP to abdomen, more around RUQ and lower abdomen.  Mild distension to lower abdomen and  Musculoskeletal: Normal range of motion. She exhibits no edema.  Lymphadenopathy:    She has no cervical adenopathy.  Neurological: She is alert and oriented to person, place, and time.  Skin: Skin is warm and dry. No rash noted.  Psychiatric: She has a normal mood and affect.    ED Course    Procedures (including critical care time)    Results for orders placed during the hospital encounter of 11/24/11  CBC      Component Value Range   WBC 17.3 (*) 4.0 - 10.5 (K/uL)   RBC 4.43  3.87 - 5.11 (MIL/uL)   Hemoglobin 13.1  12.0 - 15.0 (g/dL)   HCT 40.9  81.1 - 91.4 (%)   MCV 90.3  78.0 - 100.0 (fL)   MCH  29.6  26.0 - 34.0 (pg)   MCHC 32.8  30.0 - 36.0 (g/dL)   RDW 16.1  09.6 - 04.5 (%)   Platelets 308  150 - 400 (K/uL)  DIFFERENTIAL      Component Value Range   Neutrophils Relative 87 (*) 43 - 77 (%)   Neutro Abs 15.0 (*) 1.7 - 7.7 (K/uL)   Lymphocytes Relative 6 (*) 12 - 46 (%)   Lymphs Abs 1.0  0.7 - 4.0 (K/uL)   Monocytes Relative 7  3 - 12 (%)   Monocytes Absolute 1.2 (*) 0.1 - 1.0 (K/uL)   Eosinophils Relative 1  0 - 5 (%)   Eosinophils Absolute 0.1  0.0 - 0.7 (K/uL)   Basophils Relative 0  0 - 1 (%)   Basophils Absolute 0.0  0.0 - 0.1 (K/uL)  COMPREHENSIVE METABOLIC PANEL      Component Value Range   Sodium 138  135 - 145 (mEq/L)   Potassium 3.7  3.5 - 5.1 (mEq/L)   Chloride 102  96 - 112 (mEq/L)   CO2 28  19 - 32 (mEq/L)   Glucose, Bld 117 (*) 70 - 99 (mg/dL)   BUN 20  6 - 23 (mg/dL)   Creatinine, Ser 4.09  0.50 - 1.10 (mg/dL)   Calcium 8.5  8.4 - 81.1 (mg/dL)   Total Protein 6.6  6.0 - 8.3 (g/dL)   Albumin 2.9 (*) 3.5 - 5.2 (g/dL)   AST 12  0 - 37 (U/L)   ALT 17  0 - 35 (U/L)   Alkaline Phosphatase 115  39 - 117 (U/L)   Total Bilirubin 0.8  0.3 - 1.2 (mg/dL)   GFR calc non Af Amer >90  >90 (mL/min)   GFR calc Af Amer >90  >90 (mL/min)  LIPASE, BLOOD      Component Value Range   Lipase 9 (*) 11 - 59 (U/L)  URINALYSIS, ROUTINE W REFLEX MICROSCOPIC      Component Value Range   Color, Urine AMBER (*) YELLOW    APPearance CLOUDY (*) CLEAR    Specific Gravity, Urine 1.039 (*) 1.005 - 1.030    pH 6.0  5.0 - 8.0    Glucose, UA NEGATIVE  NEGATIVE (mg/dL)   Hgb urine dipstick LARGE (*) NEGATIVE    Bilirubin Urine SMALL (*) NEGATIVE    Ketones, ur  NEGATIVE  NEGATIVE (mg/dL)   Protein, ur 30 (*) NEGATIVE (mg/dL)   Urobilinogen, UA 1.0  0.0 - 1.0 (mg/dL)   Nitrite POSITIVE (*) NEGATIVE    Leukocytes, UA MODERATE (*) NEGATIVE   URINE MICROSCOPIC-ADD ON      Component Value Range   Squamous Epithelial / LPF FEW (*) RARE    WBC, UA 11-20  <3 (WBC/hpf)   RBC / HPF 7-10  <3 (RBC/hpf)   Bacteria, UA MANY (*) RARE    US Abdomen Limited  11/21/2011  *RADIOLOGY REPORT*  Clinical Data:  Placement of gallbladder stents.  Gallbladder drain for 2 months.  LIMITED ABDOMINAL ULTRASOUND - RIGHT UPPER QUADRANT  Comparison:  CT 02/26/1925 13  Findings:  Gallbladder:  Right upper quadrant gallbladder stent is visualized. The gallbladder is contracted.  This results in a wall echo shadow complex.  This could be due to gas within the stent.  Stones are not excluded.  No gallbladder distension.  Common bile duct:  Normal caliber, with measured diameter of about 2 mm.  Liver:  Homogeneous liver parenchymal echotexture.  No focal mass lesions demonstrated.  IMPRESSION: Gallbladder stent is visualized.  The gallbladder is contracted with wall echo shadow complex, possibly due to gas within the stent or to stones.  No gallbladder distension or bile duct dilatation.                   Original Report Authenticated By: Marlon Pel, M.D.       No results found.   1. Abdominal pain   2. UTI (lower urinary tract infection)       MDM  Pt with abd pain, family and pt wants to have gallbladder out.  We are unable to do cholangiogram today given that IR physician had to go home sick today.  Family does not want to take pt home.  Spoke with Dr. Ezzard Standing who will come see pt, requesting that she be admitted to [facility].  Started on Heide Scales, MD 11/24/11 1930  Rolan Bucco, MD 11/24/11 [phone]

## 2011-11-24 NOTE — Consult Note (Signed)
Re:   Sydney [name] DOB:   July 21, 1935 MRN:   [id number]  ASSESSMENT AND PLAN: 1.  Gall Bladder disease  Percutaneous drain - 09/25/2011.  The perc GB drain has been in 8 weeks.  The patient's care giver, Sydney [name], has had increasing difficulty with the perc drain.  They were at the [facility] this past Friday, 4/26, with similar problems.  Sydney [name] spoke to our office today and they told her to go to the [facility].  The three options include:  1.  Continue perc cholecystomy tube (but this is not working), 2.  Pull the cholecystostomy tube, 3.  Operate to take her GB out.  Sydney [name], her POA, and the patient, Sydney [name] understand the risks attendant with surgery and general anesthesia, but it seems to me, that the best course is to take her gall bladder out and go from there.  I spoke to Dr. Andrey Campanile earlier tonight.  Dr. Dwain Sarna is our surgeon of the week at [facility] this week.  I will discuss the patient with him in the AM.  2.  Huntington's disease  Late onset.  Sees Dr. Peterson Lombard at [facility].  3.  History of A. Fib.  In sinus rhythm right now. 4.  Chronic respiratory failure.  Uses O2 at times. 5.  History of recurrent UTI's. 6.  On Hospice x 2 years - No code.  Chief Complaint  Patient presents with  . Abdominal Pain   REFERRING PHYSICIAN:  Rolan Bucco, MD, Janyce Llanos  HISTORY OF PRESENT ILLNESS: Sydney [name] is a 77 y.o. (DOB: 04-11-1935)  white female whose primary care physician is Ginette Otto, MD, MD and comes to the [facility] with recurrent abdominal pain and a questionably functioning cholecystostomy tube.  She was hospitalized in February, 2013, found to have gall stones and had a perc cholecystostomy tube.  She has seen Dr. Andrey Campanile on an outpatient, but because of her underlying medical problems, he has been reluctant to proceed with cholecystectomy.  She sees Dr. Peterson Lombard at [facility] for her Huntington's disease.  She has an appointment for a second opinion with a surgeon at [facility] next Monday,  May 6.  She was apparently ambulatory, with a cane before February, but how needs assistance to get out of bed.  She has been on Hospice for 2 years.  She's had a prior hysterectomy and appendectomy.   No history of stomach disease.  No history of liver disease. No history of pancreas disease.  No history of colon disease.    Past Medical History  Diagnosis Date  . Huntington disease   . Atrial fibrillation   . Chronic respiratory failure     nocturnal oxygen  . Complication of anesthesia     felt surgery with epidural  . GERD (gastroesophageal reflux disease)   . Thyroid disease       Past Surgical History  Procedure Date  . Colon surgery   . Abdominal hysterectomy   . Hemorrhoid surgery   . Tonsillectomy   . Appendectomy       Current Facility-Administered Medications  Medication Dose Route Frequency Provider Last Rate Last Dose  . ciprofloxacin (CIPRO) tablet 500 mg  500 mg Oral Once Rolan Bucco, MD   500 mg at 11/24/11 1834  . HYDROcodone-acetaminophen (NORCO) 5-325 MG per tablet 2 tablet  2 tablet Oral Once Rolan Bucco, MD   2 tablet at 11/24/11 1834   Current Outpatient Prescriptions  Medication Sig Dispense Refill  . ALPRAZolam Prudy Feeler)  0.25 MG tablet Take 0.25 mg by mouth every 8 (eight) hours as needed. For anxiety       . aspirin EC 81 MG tablet Take 81 mg by mouth daily.        . clonazePAM (KLONOPIN) 0.5 MG tablet Take 1 mg by mouth at bedtime.       . digoxin (LANOXIN) 0.25 MG tablet Take 250 mcg by mouth daily.        Marland Kitchen estrogens, conjugated, (PREMARIN) 0.625 MG tablet Take 0.625 mg by mouth See admin instructions. Taken daily for first 25 days of each month.      Marland Kitchen FLUoxetine (PROZAC) 20 MG capsule Take 20 mg by mouth daily.        . haloperidol (HALDOL) 0.5 MG tablet Take 0.5 mg by mouth 2 (two) times daily.        Marland Kitchen HYDROcodone-acetaminophen (VICODIN) 5-500 MG per tablet Take 1-2 tablets by mouth every 6 (six) hours as needed for pain.  15 tablet  0  .  ibuprofen (ADVIL,MOTRIN) 800 MG tablet Take 800 mg by mouth every 8 (eight) hours as needed. For pain       . levothyroxine (SYNTHROID, LEVOTHROID) 50 MCG tablet Take 50 mcg by mouth daily.        . meclizine (ANTIVERT) 25 MG tablet Take 25 mg by mouth 3 (three) times daily as needed. For dizziness/nausea.      . nitroGLYCERIN (NITROSTAT) 0.4 MG SL tablet Place 0.4 mg under the tongue every 5 (five) minutes x 3 doses as needed.      Marland Kitchen omeprazole (PRILOSEC) 20 MG capsule Take 20 mg by mouth daily.      . ondansetron (ZOFRAN-ODT) 8 MG disintegrating tablet Take 1 tablet (8 mg total) by mouth every 8 (eight) hours as needed for nausea. For nausea.  20 tablet  1  . polyethylene glycol (MIRALAX / GLYCOLAX) packet Take 17 g by mouth daily as needed. For constipation.      . promethazine (PHENERGAN) 25 MG suppository Place 25 mg rectally every 8 (eight) hours as needed. For nausea.      Marland Kitchen senna-docusate (SENOKOT-S) 8.6-50 MG per tablet Take 2 tablets by mouth 2 (two) times daily.          Allergies  Allergen Reactions  . Codeine Itching  . Penicillins Cross Reactors Hives    REVIEW OF SYSTEMS: Skin:  No history of rash.  No history of abnormal moles. Infection:  No history of hepatitis or HIV.  No history of MRSA. Neurologic:  Huntington's disease, late onset.  Seen by Dr. Peterson Lombard at [facility]. Cardiac:  History of A. Fib.  Has seen Dr. Langston Reusing in past, but most heart issues now handled by Dr. Pete Glatter. Pulmonary: History of aspiration.  Uses O2 occasionally at home.  Endocrine:  No diabetes. No thyroid disease. Gastrointestinal: see HPI Urologic: Recurrent UTI's.  Does not have a foley at home. Musculoskeletal: Progressive weakness secondary to Huntington's diseae. Hematologic:  No bleeding disorder.  No history of anemia.  Not anticoagulated. Psycho-social:  The patient is oriented.     SOCIAL and FAMILY HISTORY: Sydney [name] is her POA. She gave most of the history. The patient is  not married and has no children.  PHYSICAL EXAM: BP 153/67  Pulse 83  Temp(Src) 98 F (36.7 C) (Oral)  Resp 16  SpO2 97%  General: Older WF who is alert.  HEENT: Normal. Pupils equal. Poor dentition. Neck: Supple. No mass.  No  thyroid mass. Lymph Nodes:  No supraclavicular or cervical nodes. Lungs: Clear to auscultation and symmetric breath sounds. Heart:  RRR. No murmur or rub.  Abdomen: Soft. No mass. Per GB drain in right upper quadrant. It has bile in the tube.  I was not impressed with her abdominal pain. Rectal: Not done. Extremities:  Weak upper and lower extremities. Neurologic:  Grossly intact to sensory function. Psychiatric: Has normal mood and affect. Behavior is normal.   DATA REVIEWED: Old chart, labs, and x-rays.  Ovidio Kin, MD,  [facility] [address] [place]  [address]   [place], [place]    [address] Phone:  [phone] FAX:  [phone]

## 2011-11-24 NOTE — ED Notes (Signed)
Attempted to call report. Floor RN unable to accept report.  

## 2011-11-24 NOTE — ED Notes (Signed)
Verified by pt and pt's sister. Pt has been taking norco at home with out any intolerance or reaction. Pt tolerates norco ok

## 2011-11-24 NOTE — H&P (Signed)
PCP:   Ginette Otto, MD, MD   Chief Complaint: Persistent abdominal pain nausea and vomiting   HPI: Sydney [name] is an 77 y.o. female with history of "late onset" Huntington's disease, atrial fibrillation, chronic respiratory insufficiency on home O2 when necessary, hypothyroidism, status post biliary drainage tube placement in February 2013, frequent abdominal pains requiring frequent ER visits, presents once more with abdominal pain nausea, vomiting, unable to tolerate by mouth. Evaluation in emergency room included elevated white count to 17,000, normal hemoglobin, normal renal function tests, and normal liver function tests as well. She recently had a abdominal ultrasound which showed good placement of the biliary tube and no evidence of obstruction. Her urinalysis however is positive for UTI. The family has been wanting for her to have a cholecystectomy as a definitive treatment. Surgical consultation with Dr. Ezzard Standing was done in the emergency room, and further consideration will be given for cholecystectomy. Hospitalist was asked to admit her for abdominal pain, nausea, and UTI. She also admitted to have been constipated, on narcotic.  Rewiew of Systems:  The patient denies anorexia, fever, weight loss,, vision loss, decreased hearing, hoarseness, chest pain, syncope, dyspnea on exertion, peripheral edema, balance deficits, hemoptysis, melena, hematochezia, severe indigestion/heartburn, hematuria, incontinence, genital sores, muscle weakness, suspicious skin lesions, transient blindness, difficulty walking, depression, unusual weight change, abnormal bleeding, enlarged lymph nodes, angioedema, and breast masses.   Past Medical History  Diagnosis Date  . Huntington disease   . Atrial fibrillation   . Chronic respiratory failure     nocturnal oxygen  . Complication of anesthesia     felt surgery with epidural  . GERD (gastroesophageal reflux disease)   . Thyroid disease      Past Surgical History  Procedure Date  . Colon surgery   . Abdominal hysterectomy   . Hemorrhoid surgery   . Tonsillectomy   . Appendectomy     Medications:  HOME MEDS: Prior to Admission medications   Medication Sig Start Date End Date Taking? Authorizing Provider  ALPRAZolam (XANAX) 0.25 MG tablet Take 0.25 mg by mouth every 8 (eight) hours as needed. For anxiety    Yes Historical Provider, MD  aspirin EC 81 MG tablet Take 81 mg by mouth daily.     Yes Historical Provider, MD  clonazePAM (KLONOPIN) 0.5 MG tablet Take 1 mg by mouth at bedtime.    Yes Historical Provider, MD  digoxin (LANOXIN) 0.25 MG tablet Take 250 mcg by mouth daily.     Yes Historical Provider, MD  estrogens, conjugated, (PREMARIN) 0.625 MG tablet Take 0.625 mg by mouth See admin instructions. Taken daily for first 25 days of each month.   Yes Historical Provider, MD  FLUoxetine (PROZAC) 20 MG capsule Take 20 mg by mouth daily.     Yes Historical Provider, MD  haloperidol (HALDOL) 0.5 MG tablet Take 0.5 mg by mouth 2 (two) times daily.     Yes Historical Provider, MD  HYDROcodone-acetaminophen (VICODIN) 5-500 MG per tablet Take 1-2 tablets by mouth every 6 (six) hours as needed for pain. 11/21/11 12/01/11 Yes Olivia Mackie, MD  ibuprofen (ADVIL,MOTRIN) 800 MG tablet Take 800 mg by mouth every 8 (eight) hours as needed. For pain    Yes Historical Provider, MD  levothyroxine (SYNTHROID, LEVOTHROID) 50 MCG tablet Take 50 mcg by mouth daily.     Yes Historical Provider, MD  meclizine (ANTIVERT) 25 MG tablet Take 25 mg by mouth 3 (three) times daily as needed. For dizziness/nausea.  Yes Historical Provider, MD  nitroGLYCERIN (NITROSTAT) 0.4 MG SL tablet Place 0.4 mg under the tongue every 5 (five) minutes x 3 doses as needed.   Yes Historical Provider, MD  omeprazole (PRILOSEC) 20 MG capsule Take 20 mg by mouth daily.   Yes Historical Provider, MD  ondansetron (ZOFRAN-ODT) 8 MG disintegrating tablet Take 1 tablet (8 mg  total) by mouth every 8 (eight) hours as needed for nausea. For nausea. 10/02/11  Yes Penny Pia, MD  polyethylene glycol (MIRALAX / GLYCOLAX) packet Take 17 g by mouth daily as needed. For constipation.   Yes Historical Provider, MD  promethazine (PHENERGAN) 25 MG suppository Place 25 mg rectally every 8 (eight) hours as needed. For nausea.   Yes Historical Provider, MD  senna-docusate (SENOKOT-S) 8.6-50 MG per tablet Take 2 tablets by mouth 2 (two) times daily.   Yes Historical Provider, MD     Allergies:  Allergies  Allergen Reactions  . Codeine Itching  . Penicillins Cross Reactors Hives    Social History:   reports that she quit smoking about 13 years ago. She has never used smokeless tobacco. She reports that she does not drink alcohol or use illicit drugs.  Family History: Family History  Problem Relation Age of Onset  . Huntington's disease    . Diabetes type II    . Coronary artery disease    . Heart disease Mother   . Stroke Father   . Heart disease Brother      Physical Exam: Filed Vitals:   11/24/11 1506 11/24/11 1515 11/24/11 1933  BP:  153/67 146/69  Pulse:  83 80  Temp:  98 F (36.7 C) 98.6 F (37 C)  TempSrc:  Oral Oral  Resp:  16 17  SpO2: 95% 97% 96%   Blood pressure 146/69, pulse 80, temperature 98.6 F (37 C), temperature source Oral, resp. rate 17, SpO2 96.00%.  GEN:  Pleasant person lying in the stretcher in no acute distress; cooperative with exam PSYCH:  alert and oriented x4; does not appear anxious or depressed; affect is appropriate. HEENT: Mucous membranes pink and anicteric; PERRLA; EOM intact; no cervical lymphadenopathy nor thyromegaly or carotid bruit; no JVD; Breasts:: Not examined CHEST WALL: No tenderness CHEST: Normal respiration, clear to auscultation bilaterally HEART: Regular rate and rhythm; no murmurs rubs or gallops BACK: No kyphosis or scoliosis; no CVA tenderness ABDOMEN: Diffusely tender, bowel sounds  present. No  palpable mass, no rebound. Biliary tube in place right upper quadrant. Foley is in place. Rectal Exam: Not done EXTREMITIES: No bone or joint deformity; age-appropriate arthropathy of the hands and knees; no edema; no ulcerations. Genitalia: not examined PULSES: 2+ and symmetric SKIN: Normal hydration no rash or ulceration CNS: Cranial nerves 2-12 grossly intact no focal lateralizing neurologic deficit   Labs & Imaging Results for orders placed during the hospital encounter of 11/24/11 (from the past 48 hour(s))  CBC     Status: Abnormal   Collection Time   11/24/11  4:49 PM      Component Value Range Comment   WBC 17.3 (*) 4.0 - 10.5 (K/uL)    RBC 4.43  3.87 - 5.11 (MIL/uL)    Hemoglobin 13.1  12.0 - 15.0 (g/dL)    HCT 29.5  28.4 - 13.2 (%)    MCV 90.3  78.0 - 100.0 (fL)    MCH 29.6  26.0 - 34.0 (pg)    MCHC 32.8  30.0 - 36.0 (g/dL)    RDW 44.0  10.2 -  15.5 (%)    Platelets 308  150 - 400 (K/uL)   DIFFERENTIAL     Status: Abnormal   Collection Time   11/24/11  4:49 PM      Component Value Range Comment   Neutrophils Relative 87 (*) 43 - 77 (%)    Neutro Abs 15.0 (*) 1.7 - 7.7 (K/uL)    Lymphocytes Relative 6 (*) 12 - 46 (%)    Lymphs Abs 1.0  0.7 - 4.0 (K/uL)    Monocytes Relative 7  3 - 12 (%)    Monocytes Absolute 1.2 (*) 0.1 - 1.0 (K/uL)    Eosinophils Relative 1  0 - 5 (%)    Eosinophils Absolute 0.1  0.0 - 0.7 (K/uL)    Basophils Relative 0  0 - 1 (%)    Basophils Absolute 0.0  0.0 - 0.1 (K/uL)   COMPREHENSIVE METABOLIC PANEL     Status: Abnormal   Collection Time   11/24/11  4:49 PM      Component Value Range Comment   Sodium 138  135 - 145 (mEq/L)    Potassium 3.7  3.5 - 5.1 (mEq/L)    Chloride 102  96 - 112 (mEq/L)    CO2 28  19 - 32 (mEq/L)    Glucose, Bld 117 (*) 70 - 99 (mg/dL)    BUN 20  6 - 23 (mg/dL)    Creatinine, Ser 1.61  0.50 - 1.10 (mg/dL)    Calcium 8.5  8.4 - 10.5 (mg/dL)    Total Protein 6.6  6.0 - 8.3 (g/dL)    Albumin 2.9 (*) 3.5 - 5.2 (g/dL)     AST 12  0 - 37 (U/L)    ALT 17  0 - 35 (U/L)    Alkaline Phosphatase 115  39 - 117 (U/L)    Total Bilirubin 0.8  0.3 - 1.2 (mg/dL)    GFR calc non Af Amer >90  >90 (mL/min)    GFR calc Af Amer >90  >90 (mL/min)   LIPASE, BLOOD     Status: Abnormal   Collection Time   11/24/11  4:49 PM      Component Value Range Comment   Lipase 9 (*) 11 - 59 (U/L)   URINALYSIS, ROUTINE W REFLEX MICROSCOPIC     Status: Abnormal   Collection Time   11/24/11  5:03 PM      Component Value Range Comment   Color, Urine AMBER (*) YELLOW  BIOCHEMICALS MAY BE AFFECTED BY COLOR   APPearance CLOUDY (*) CLEAR     Specific Gravity, Urine 1.039 (*) 1.005 - 1.030     pH 6.0  5.0 - 8.0     Glucose, UA NEGATIVE  NEGATIVE (mg/dL)    Hgb urine dipstick LARGE (*) NEGATIVE     Bilirubin Urine SMALL (*) NEGATIVE     Ketones, ur NEGATIVE  NEGATIVE (mg/dL)    Protein, ur 30 (*) NEGATIVE (mg/dL)    Urobilinogen, UA 1.0  0.0 - 1.0 (mg/dL)    Nitrite POSITIVE (*) NEGATIVE     Leukocytes, UA MODERATE (*) NEGATIVE    URINE MICROSCOPIC-ADD ON     Status: Abnormal   Collection Time   11/24/11  5:03 PM      Component Value Range Comment   Squamous Epithelial / LPF FEW (*) RARE     WBC, UA 11-20  <3 (WBC/hpf)    RBC / HPF 7-10  <3 (RBC/hpf)    Bacteria, UA MANY (*)  RARE     No results found.    Assessment Present on Admission:  .Pyelonephritis .Huntington's disease .Cholecystitis with cholelithiasis s/p percutaneous drain 2/28 .Atrial fibrillation .Constipation .Hypothyroidism   PLAN: Will admit her for IV antibiotic. She will be given Cipro and Flagyl. She will be made n.p.o. Surgeon will speak with the family and decide on cholecystectomy. IR to do cholangiogram as per surgery. For constipation will do aggressive bowel regimen. We'll DC her narcotic unless absolutely necessary. For her atrial fibrillation, we'll continue digitalis, I would check a level.  For her hypothyroidism, we will continue supplement and  check a TSH.  It is possible that her nausea, vomiting, and leukocytosis are the results of her pyelonephritis alone. I confirmed her CODE STATUS and she is a DO NOT RESUSCITATE. We will honor this. Other plans as per orders.   Lafawn Lenoir 11/24/2011, 8:35 PM

## 2011-11-24 NOTE — ED Notes (Signed)
ZOX:[id number]<AV> Expected date:11/24/11<BR> Expected time: 2:05 PM<BR> Means of arrival:Ambulance<BR> Comments:<BR> EMS hold

## 2011-11-24 NOTE — Telephone Encounter (Signed)
Sydney [name] patient's daughter called to let us know this weekend her drain has been very painful when flushed. Pain in abdomen and chest every time it is flushed. Has been draining clear/yellow/green in bag 300 cc yesterday. Per Dr Andrey Campanile, needs cholangiogram scheduled. I called patient to inform them she should have this done and daughter states mother is "screaming in pain" and her abdomen is swollen. She has recently taken two pain pills and it is not touching pain. Told patient's daughter she should take her directly to the emergency room. She is taking patient to [facility] ER. Spoke with Elnita Maxwell in radiology. They would not be able to schedule cholangiogram for today, so she advised to let emergency room evaluate because they will probably perform this test anyway to evaluate her pain. I will check on status of patient tomorrow after she is evaluated in the ER. I made Dr Andrey Campanile aware patient was going to ER.

## 2011-11-25 LAB — CBC
HCT: 37.7 % (ref 36.0–46.0)
Hemoglobin: 12.5 g/dL (ref 12.0–15.0)
MCHC: 33.2 g/dL (ref 30.0–36.0)
MCV: 90.2 fL (ref 78.0–100.0)
RDW: 13.4 % (ref 11.5–15.5)

## 2011-11-25 LAB — BASIC METABOLIC PANEL
BUN: 19 mg/dL (ref 6–23)
CO2: 24 mEq/L (ref 19–32)
Chloride: 100 mEq/L (ref 96–112)
Creatinine, Ser: 0.56 mg/dL (ref 0.50–1.10)
Potassium: 3.6 mEq/L (ref 3.5–5.1)

## 2011-11-25 LAB — TSH: TSH: 1.857 u[IU]/mL (ref 0.350–4.500)

## 2011-11-25 NOTE — Progress Notes (Signed)
[facility] Chaplain visit - Pt awake in bed and joined by pcg/close friend Eugenio Hoes.  Pt in good mood with good humor, which Fannie Knee attributes to to some degree to pt's UTI.  Pt and Fannie Knee thankful for pt staying in hospital to get evaluated for options concerning her gall bladder, and asked for prayer for wisdom and discernment. Minister Onalee Hua continues to be active in support.  Pt and Fannie Knee eager to have prayer for guidance and blessing. Lovenia Shuck, [facility] Chaplain

## 2011-11-25 NOTE — Progress Notes (Signed)
UR completed 

## 2011-11-25 NOTE — Progress Notes (Signed)
Nutrition Brief Note  - Pt screened for dysphagia on admission, however after meeting with pt and family pt denies any problems chewing or swallowing. Pt followed by hospice for 2 years. Pt eats what she wants to at home and enjoys milk chocolate Ensure. Noted pt has been gaining weight since 11/07/11 when pt weighed 115 pounds, now weighs 123 pounds. Pt DNR and followed by hospice RN during admission. Pt NPO awaiting to see if they will do surgery to remove gallbladder. Pt with output from biliary drain yesterday. Recommend nutrition per pt wishes once diet resumed. Will monitor.   Dietitian # [phone]

## 2011-11-25 NOTE — Progress Notes (Signed)
Pt known to me. Chart reviewed. Labs reviewed. Spoke with POA. Please see my clinic note from 1.5 weeks ago. Spoken with Dr Dwain Sarna regarding pt and agree with his plan for first getting cholangiogram of cholecystostomy tube to evaluate position and biliary ductal system followed by cholecystectomy during this admission by our acute care surgeon for this week Dr Dwain Sarna.  Mary Sella. Andrey Campanile, MD, FACS General, Bariatric, & Minimally Invasive Surgery [facility], [place]

## 2011-11-25 NOTE — Progress Notes (Signed)
Subjective: C/o intermittent abdominal pain No vomiting  Objective: Vital signs in last 24 hours: Temp:  [98 F (36.7 C)-98.6 F (37 C)] 98.2 F (36.8 C) (04/30 0515) Pulse Rate:  [76-83] 76  (04/30 0515) Resp:  [16-18] 18  (04/30 0515) BP: (124-153)/(52-69) 124/52 mmHg (04/30 0515) SpO2:  [94 %-97 %] 97 % (04/30 0515) Weight change:  Last BM Date: 11/21/11  Intake/Output from previous day: 04/29 0701 - 04/30 0700 In: 615 [I.V.:415; IV Piggyback:200] Out: 150 [Drains:150]    Physical Exam: General: Alert, awake, oriented x to place, person, in no acute distress. HEENT: No bruits, no goiter. Heart: Regular rate and rhythm, without murmurs, rubs, gallops. Lungs: Clear to auscultation bilaterally. Abdomen: Soft, nontender, nondistended, GB drain with bile, positive bowel sounds. Extremities: No clubbing cyanosis or edema with positive pedal pulses. Neuro: no focal deficits, spasmodic movements noted in lower extremities    Lab Results: Basic Metabolic Panel:  Basename 11/25/11 0533 11/24/11 1649  NA 136 138  K 3.6 3.7  CL 100 102  CO2 24 28  GLUCOSE 109* 117*  BUN 19 20  CREATININE 0.56 0.53  CALCIUM 8.6 8.5  MG -- --  PHOS -- --   Liver Function Tests:  [facility] 11/24/11 1649  AST 12  ALT 17  ALKPHOS 115  BILITOT 0.8  PROT 6.6  ALBUMIN 2.9*    Basename 11/24/11 1649  LIPASE 9*  AMYLASE --   No results found for this basename: AMMONIA:2 in the last 72 hours CBC:  Basename 11/25/11 0533 11/24/11 1649  WBC 14.5* 17.3*  NEUTROABS -- 15.0*  HGB 12.5 13.1  HCT 37.7 40.0  MCV 90.2 90.3  PLT 312 308   Cardiac Enzymes: No results found for this basename: CKTOTAL:3,CKMB:3,CKMBINDEX:3,TROPONINI:3 in the last 72 hours BNP: No results found for this basename: PROBNP:3 in the last 72 hours D-Dimer: No results found for this basename: DDIMER:2 in the last 72 hours CBG: No results found for this basename: GLUCAP:6 in the last 72 hours Hemoglobin  A1C: No results found for this basename: HGBA1C in the last 72 hours Fasting Lipid Panel: No results found for this basename: CHOL,HDL,LDLCALC,TRIG,CHOLHDL,LDLDIRECT in the last 72 hours Thyroid Function Tests:  Basename 11/24/11 2130  TSH 1.857  T4TOTAL --  FREET4 --  T3FREE --  THYROIDAB --   Anemia Panel: No results found for this basename: VITAMINB12,FOLATE,FERRITIN,TIBC,IRON,RETICCTPCT in the last 72 hours Coagulation: No results found for this basename: LABPROT:2,INR:2 in the last 72 hours Urine Drug Screen: Drugs of Abuse  No results found for this basename: labopia, cocainscrnur, labbenz, amphetmu, thcu, labbarb    Alcohol Level: No results found for this basename: ETH:2 in the last 72 hours Urinalysis:  Basename 11/24/11 1703  COLORURINE AMBER*  LABSPEC 1.039*  PHURINE 6.0  GLUCOSEU NEGATIVE  HGBUR LARGE*  BILIRUBINUR SMALL*  KETONESUR NEGATIVE  PROTEINUR 30*  UROBILINOGEN 1.0  NITRITE POSITIVE*  LEUKOCYTESUR MODERATE*    Recent Results (from the past 240 hour(s))  URINE CULTURE     Status: Normal   Collection Time   11/21/11  1:03 AM      Component Value Range Status Comment   Specimen Description URINE, CATHETERIZED   Final    Special Requests NONE   Final    Culture  Setup Time [id number]   Final    Colony Count NO GROWTH   Final    Culture NO GROWTH   Final    Report Status 11/22/2011 FINAL   Final  Studies/Results: No results found.  Medications: Scheduled Meds:   . aspirin EC  81 mg Oral Daily  . ciprofloxacin  400 mg Intravenous Q12H  . ciprofloxacin  500 mg Oral Once  . digoxin  250 mcg Oral Daily  . docusate sodium  100 mg Oral BID  . FLUoxetine  20 mg Oral Daily  . haloperidol  0.5 mg Oral BID  . heparin  5,000 Units Subcutaneous Q8H  . HYDROcodone-acetaminophen  2 tablet Oral Once  . levothyroxine  50 mcg Oral Q breakfast  . pantoprazole  40 mg Oral Q1200  . senna  1 tablet Oral BID  . sodium chloride  3 mL Intravenous Q12H   . DISCONTD: metronidazole  500 mg Intravenous Q8H   Continuous Infusions:   . dextrose 5 % and 0.9% NaCl 50 mL/hr at 11/24/11 2140   PRN Meds:.ALPRAZolam, HYDROmorphone, nitroGLYCERIN, ondansetron (ZOFRAN) IV, ondansetron, ondansetron, sodium phosphate  Assessment/Plan: Present on Admission:  1. UTI- continue ciprofloxacin, FU urine cultures 2. Huntington's disease-followed at [facility] and under hospice care 3. Recent Cholecystitis with cholelithiasis s/p percutaneous drain 2/28, Being evaluated by [facility], recent USG 4/26 with contracted GB 4. Atrial fibrillation-rate controlled in NSR, continue Digoxin, Aspirin 5. Constipation-await [facility] plans before changing bowel regimen 6. Hypothyroidism-continue Synthroid  7. Chronic resp failure due to huntington's disease-uses Home O2 PRN DNR   LOS: 1 day   Brayton Baumgartner [facility] Pager: [phone] 11/25/2011, 11:15 AM

## 2011-11-25 NOTE — Progress Notes (Signed)
I think some of symptoms are referable to her uti but she still continues to have some issues with gb.  It is somewhat difficult to tell.  I think she likely should just have a cholecystectomy maybe later this week when uti better.  I will discuss with physician at [facility].  Check tube in am and likely plan lap chole later this week

## 2011-11-25 NOTE — Progress Notes (Signed)
   CARE MANAGEMENT NOTE 11/25/2011  Patient:  Sydney [name], Sydney [name]   Account Number:  [id number]  Date Initiated:  11/25/2011  Documentation initiated by:  Mailen Newborn  Subjective/Objective Assessment:   pt adm with Pyelonephritis  .Huntington's disease  .Cholecystitis with cholelithiasis s/p percutaneous drain 2/28     Action/Plan:   active with [facility]. lives with Eugenio Hoes, caregiver-- dcp to return home w/ hospice services   Anticipated DC Date:  11/29/2011   Anticipated DC Plan:  HOME W HOSPICE CARE  In-house referral  Clinical Social Worker      DC Planning Services  CM consult      [facility] Choice  Resumption Of Svcs/PTA Provider   Choice offered to / List presented to:  NA   DME arranged  NA      DME agency  NA     HH arranged  NA      HH agency  NA   Status of service:  In process, will continue to follow Medicare Important Message given?   (If response is "NO", the following Medicare IM given date fields will be blank) Date Medicare IM given:   Date Additional Medicare IM given:    Discharge Disposition:  HOME W HOSPICE CARE  Per UR Regulation:  Reviewed for med. necessity/level of care/duration of stay  If discussed at Long Length of Stay Meetings, dates discussed:    Comments:  11-25-11 Raiford Noble, RN,BSN,CM [phone] Spoke with patient and caregiver, Fannie Knee at bedside. Active with [facility] and has all the dme at home she needs per caregiver. Confirmed with Rose with [facility] that pt is indeed active with them. [name]'s contact numbers are: cell [phone] and home [phone]. Will need transportation home upon dc.

## 2011-11-25 NOTE — Progress Notes (Addendum)
Room 1512 - Florena Kozma   [facility]  [facility] RN Visit  NON-Related admission to [facility] diagnosis of Huntington's disease.  Pt is DNR code.    Pt alert & oriented, lying in bed, with no complaints of pain or discomfort at time of visit.   Primary caregiver Fannie Knee and 2 members of her family present.    Per [facility] [name], pt nauseated, emesis, abd pain - came to ED to have biliary drain cholangiogram in radiology to ensure proper functioning and evaluate.  Abd ultrasound completed on admission.   Fannie Knee states pt wants her GB out and they are awaiting [facility]-MD Dwain Sarna to discuss options with patient and [facility] Fannie Knee.    Please call [facility] @ [phone] with any needs.  Thank you.  Joneen Boers, RN  [facility]  Hospice Liaison

## 2011-11-25 NOTE — Progress Notes (Signed)
Subjective: Pt is nauseated and vomiting this am. She indicates RUQ pain.  Perc chole drain appears to be functioning well, will of bilious drainage.  Her urine is amber, cloudy. UA was + for UTI, UCx is pending.  Her caregiver reports that she has been nauseated and c/o abd pain since last week and also constipated. She was having to give her hydrocodone frequently for pain.   Objective: Vital signs in last 24 hours: Temp:  [98 F (36.7 C)-98.6 F (37 C)] 98.2 F (36.8 C) (04/30 0515) Pulse Rate:  [76-83] 76  (04/30 0515) Resp:  [16-18] 18  (04/30 0515) BP: (124-153)/(52-69) 124/52 mmHg (04/30 0515) SpO2:  [94 %-97 %] 97 % (04/30 0515) Last BM Date: 11/21/11  Intake/Output from previous day: 04/29 0701 - 04/30 0700 In: 615 [I.V.:415; IV Piggyback:200] Out: 150 [Drains:150] Intake/Output this shift:    General appearance: alert, cooperative and she is able to verbalize some to answer questions. She is vomiting what appears to be partially digested food.    GI: +BS, soft, indicates tenderness in RUQ, some Lower tenderness as well  Lab Results:   Basename 11/25/11 0533 11/24/11 1649  WBC 14.5* 17.3*  HGB 12.5 13.1  HCT 37.7 40.0  PLT 312 308   BMET  Basename 11/25/11 0533 11/24/11 1649  NA 136 138  K 3.6 3.7  CL 100 102  CO2 24 28  GLUCOSE 109* 117*  BUN 19 20  CREATININE 0.56 0.53  CALCIUM 8.6 8.5   PT/INR No results found for this basename: LABPROT:2,INR:2 in the last 72 hours ABG No results found for this basename: PHART:2,PCO2:2,PO2:2,HCO3:2 in the last 72 hours  Studies/Results: No results found.  Anti-infectives: Anti-infectives     Start     Dose/Rate Route Frequency Ordered Stop   11/24/11 2045   ciprofloxacin (CIPRO) IVPB 400 mg        400 mg 200 mL/hr over 60 Minutes Intravenous Every 12 hours 11/24/11 2044     11/24/11 2045   metroNIDAZOLE (FLAGYL) IVPB 500 mg  Status:  Discontinued        500 mg 100 mL/hr over 60 Minutes  Intravenous Every 8 hours 11/24/11 2044 11/25/11 1051   11/24/11 1815   ciprofloxacin (CIPRO) tablet 500 mg        500 mg Oral  Once 11/24/11 1802 11/24/11 1834          Assessment/Plan: s/p * No surgery found * 1.  Gall Bladder disease             Percutaneous drain - 09/25/2011. Per Dr. Allene Pyo consult-                      The three options include:  1.  Continue perc cholecystomy tube (but this is not working),   2.  Pull the cholecystostomy tube, 3.  Operate to take her GB out.  Will discuss further  with Dr. Dwain Sarna. The drain actually appears to be functioning well and I am not sure that some of her symptoms are not coming from her UTI.   She was to see a Careers adviser at [facility] next week for second opinion.  2.  Huntington's disease             Late onset.  Sees Dr. Peterson Lombard at [facility].  3.  History of A. Fib.  In sinus rhythm right now. 4.  Chronic respiratory failure.  Uses O2 at times. 5.  History of recurrent UTI's.- With current UTI 6.  On Hospice x 2 years - No code.      LOS: 1 day    [facility] Pager [phone] General Trauma Pager [phone]

## 2011-11-25 NOTE — Progress Notes (Signed)
[facility] MSW note: MSW met with Patient, caregiver-[name] and [name]-Sandra. Pt was alert and cooperative. Pt denied pain on visit. Pt stated that she had been vomiting earlier. When asked if this pt had concerns she responded no. However, Pt stated that she wanted the doctors to take out her gallbladder. Caregiver eager to talk with surgeon to discuss options for pt regarding the gallbladder and biliary drain. Fannie Knee is pt's primary and only caregiver. Pt and Fannie Knee live together. Pt is a DNR. MSW offered support and active listening. Please call with questions or concerns.   Elijio Miles, MSW

## 2011-11-25 NOTE — Progress Notes (Signed)
Tele called with heart rate in the 40-50's.  No sides and symptoms noted, Surgery pa at bedside informed.

## 2011-11-25 NOTE — Progress Notes (Signed)
Vital signs charted Dr Jomarie Longs called and informed of heart rate decreased.  Will monitor, no new orders noted at this time.

## 2011-11-26 ENCOUNTER — Inpatient Hospital Stay (HOSPITAL_COMMUNITY): Payer: Medicare Other

## 2011-11-26 ENCOUNTER — Encounter (INDEPENDENT_AMBULATORY_CARE_PROVIDER_SITE_OTHER): Payer: Medicare Other | Admitting: General Surgery

## 2011-11-26 DIAGNOSIS — R112 Nausea with vomiting, unspecified: Secondary | ICD-10-CM

## 2011-11-26 DIAGNOSIS — E782 Mixed hyperlipidemia: Secondary | ICD-10-CM

## 2011-11-26 DIAGNOSIS — K811 Chronic cholecystitis: Secondary | ICD-10-CM

## 2011-11-26 DIAGNOSIS — R1084 Generalized abdominal pain: Secondary | ICD-10-CM

## 2011-11-26 LAB — COMPREHENSIVE METABOLIC PANEL
Albumin: 2.4 g/dL — ABNORMAL LOW (ref 3.5–5.2)
Alkaline Phosphatase: 112 U/L (ref 39–117)
BUN: 12 mg/dL (ref 6–23)
Chloride: 102 mEq/L (ref 96–112)
Creatinine, Ser: 0.61 mg/dL (ref 0.50–1.10)
GFR calc Af Amer: >90 mL/min (ref >90)
Glucose, Bld: 109 mg/dL — ABNORMAL HIGH (ref 70–99)
Potassium: 3.2 mEq/L — ABNORMAL LOW (ref 3.5–5.1)
Total Bilirubin: 0.5 mg/dL (ref 0.3–1.2)

## 2011-11-26 LAB — CBC
HCT: 36.6 % (ref 36.0–46.0)
Hemoglobin: 11.9 g/dL — ABNORMAL LOW (ref 12.0–15.0)
MCHC: 32.5 g/dL (ref 30.0–36.0)
MCV: 90.8 fL (ref 78.0–100.0)
RDW: 13.5 % (ref 11.5–15.5)
WBC: 8.1 10*3/uL (ref 4.0–10.5)

## 2011-11-26 MED ORDER — IOHEXOL 300 MG/ML  SOLN: 18.0000 mL | Freq: Once | INTRAMUSCULAR | Status: AC | PRN

## 2011-11-26 NOTE — Progress Notes (Signed)
Subjective: Feels better today  Objective: Vital signs in last 24 hours: Temp:  [97.7 F (36.5 C)-98.2 F (36.8 C)] 98 F (36.7 C) (05/01 1500) Pulse Rate:  [73-79] 79  (05/01 1500) Resp:  [18-20] 18  (05/01 1500) BP: (125-135)/(55-68) 125/55 mmHg (05/01 1500) SpO2:  [95 %-97 %] 97 % (05/01 1500) Last BM Date: 11/21/11  Intake/Output from previous day: 04/30 0701 - 05/01 0700 In: 1604.7 [I.V.:1204.7; IV Piggyback:400] Out: 300 [Drains:300] Intake/Output this shift: Total I/O In: 600 [I.V.:400; IV Piggyback:200] Out: 1300 [Urine:600; Drains:700]  GI: tube in place and functional, soft, nontender, well healed rlq and midline incisions  Lab Results:   Basename 11/26/11 0515 11/25/11 0533  WBC 8.1 14.5*  HGB 11.9* 12.5  HCT 36.6 37.7  PLT 289 312   BMET  Basename 11/26/11 0515 11/25/11 0533  NA 137 136  K 3.2* 3.6  CL 102 100  CO2 27 24  GLUCOSE 109* 109*  BUN 12 19  CREATININE 0.61 0.56  CALCIUM 8.0* 8.6   Studies/Results: Dg Cholangiogram Post Op  11/26/2011  *RADIOLOGY REPORT*  Clinical Data:Preop cholangiogram  CATHETER CHOLANGIOGRAM  Fluoroscopy Time: 0.54 minutes  Comparison: CT 09/23/2011  Findings: 18 ml of contrast was injected through the cholecystostomy tube.  Contrast fills a contracted gallbladder and demonstrates multiple small calculi within the lumen.  Contrast flows into the common bile duct and duodenum without evidence of obstruction.  There is no persistent filling defect within the common bile duct.  No evidence of intrahepatic biliary ductal dilatation.  IMPRESSION:  1.  Cholelithiasis. 2.  No evidence of filling defect within in or obstruction of the common bile duct.  Original Report Authenticated By: Genevive Bi, M.D.    Anti-infectives: Anti-infectives     Start     Dose/Rate Route Frequency Ordered Stop   11/24/11 2045   ciprofloxacin (CIPRO) IVPB 400 mg        400 mg 200 mL/hr over 60 Minutes Intravenous Every 12 hours 11/24/11  2044     11/24/11 2045   metroNIDAZOLE (FLAGYL) IVPB 500 mg  Status:  Discontinued        500 mg 100 mL/hr over 60 Minutes Intravenous Every 8 hours 11/24/11 2044 11/25/11 1051   11/24/11 1815   ciprofloxacin (CIPRO) tablet 500 mg        500 mg Oral  Once 11/24/11 1802 11/24/11 1834          Assessment/Plan:   Cholecystitis s/p perc chole  I have had a long talk with her and her caregiver.  I have also discussed her case with her neurologist Dr. Peterson Lombard at [facility] who concurs with plan.  Will have cardiology see her prior to possible surgery on Friday just to ensure we have all information we need preop and they don't recommend anything further.  I think her medical condition is maximized currently while she is in hospital.  We discussed again that leaving tube in place would be option.  She does not want to do this and continues to have pain.  I discussed a laparoscopic cholecystectomy with them.  We discussed the risks due to surgery of injury to bowel or vessels requiring repair (this is increased due to prior surgery and I am going to try and get op report from colectomy to see what side it was), open procedure which I quoted at 20%, duct injury, bleeding, infection.  There are other risks given her medical condition such as prolonged mechanical ventilation, difficulty healing,  deconditioning and not returning to preop state, cognitive decline, cardiac complications and death.  They both understand these risks and would like to proceed.    LOS: 2 days    [facility] 11/26/2011

## 2011-11-26 NOTE — Progress Notes (Signed)
Subjective: No acute complaints reported.  Patient has had some abd discomfort on and off.  Currently denies any abd discomfort.  Objective: Filed Vitals:   11/25/11 2245 11/26/11 0700 11/26/11 1352 11/26/11 1500  BP: 135/57 132/68  125/55  Pulse: 73 76 77 79  Temp: 97.7 F (36.5 C) 98.2 F (36.8 C)  98 F (36.7 C)  TempSrc: Oral Oral  Oral  Resp: 20 20  18   Height:      Weight:      SpO2: 97% 95%  97%   Weight change:   Intake/Output Summary (Last 24 hours) at 11/26/11 1807 Last data filed at 11/26/11 1753  Gross per 24 hour  Intake   1403 ml  Output   2000 ml  Net   -597 ml    General: Alert, awake, in no acute distress.  HEENT: No bruits, no goiter.  Heart: Regular rate and rhythm, without murmurs, rubs, gallops.  Lungs: Clear to auscultation Abdomen: Soft, nontender, nondistended, positive bowel sounds.   Neuro: Patient responds to questions but limited response.     Lab Results:  [facility] 11/26/11 0515 11/25/11 0533  NA 137 136  K 3.2* 3.6  CL 102 100  CO2 27 24  GLUCOSE 109* 109*  BUN 12 19  CREATININE 0.61 0.56  CALCIUM 8.0* 8.6  MG -- --  PHOS -- --    Basename 11/26/11 0515 11/24/11 1649  AST 10 12  ALT 13 17  ALKPHOS 112 115  BILITOT 0.5 0.8  PROT 5.8* 6.6  ALBUMIN 2.4* 2.9*    Basename 11/24/11 1649  LIPASE 9*  AMYLASE --    Basename 11/26/11 0515 11/25/11 0533 11/24/11 1649  WBC 8.1 14.5* --  NEUTROABS -- -- 15.0*  HGB 11.9* 12.5 --  HCT 36.6 37.7 --  MCV 90.8 90.2 --  PLT 289 312 --   No results found for this basename: CKTOTAL:3,CKMB:3,CKMBINDEX:3,TROPONINI:3 in the last 72 hours No components found with this basename: POCBNP:3 No results found for this basename: DDIMER:2 in the last 72 hours No results found for this basename: HGBA1C:2 in the last 72 hours No results found for this basename: CHOL:2,HDL:2,LDLCALC:2,TRIG:2,CHOLHDL:2,LDLDIRECT:2 in the last 72 hours  Basename 11/24/11 2130  TSH 1.857  T4TOTAL --  T3FREE --    THYROIDAB --   No results found for this basename: VITAMINB12:2,FOLATE:2,FERRITIN:2,TIBC:2,IRON:2,RETICCTPCT:2 in the last 72 hours  Micro Results: Recent Results (from the past 240 hour(s))  URINE CULTURE     Status: Normal   Collection Time   11/21/11  1:03 AM      Component Value Range Status Comment   Specimen Description URINE, CATHETERIZED   Final    Special Requests NONE   Final    Culture  Setup Time [id number]   Final    Colony Count NO GROWTH   Final    Culture NO GROWTH   Final    Report Status 11/22/2011 FINAL   Final   URINE CULTURE     Status: Normal (Preliminary result)   Collection Time   11/24/11  5:03 PM      Component Value Range Status Comment   Specimen Description URINE, CATHETERIZED   Final    Special Requests NONE   Final    Culture  Setup Time [id number]   Final    Colony Count PENDING   Incomplete    Culture Culture reincubated for better growth   Final    Report Status PENDING   Incomplete  Studies/Results: Dg Cholangiogram Post Op  11/26/2011  *RADIOLOGY REPORT*  Clinical Data:Preop cholangiogram  CATHETER CHOLANGIOGRAM  Fluoroscopy Time: 0.54 minutes  Comparison: CT 09/23/2011  Findings: 18 ml of contrast was injected through the cholecystostomy tube.  Contrast fills a contracted gallbladder and demonstrates multiple small calculi within the lumen.  Contrast flows into the common bile duct and duodenum without evidence of obstruction.  There is no persistent filling defect within the common bile duct.  No evidence of intrahepatic biliary ductal dilatation.  IMPRESSION:  1.  Cholelithiasis. 2.  No evidence of filling defect within in or obstruction of the common bile duct.  Original Report Authenticated By: Genevive Bi, M.D.    Medications: I have reviewed the patient's current medications.   Patient Active Hospital Problem List: Cholecystitis with cholelithiasis s/p percutaneous drain 2/28 (09/24/2011) Will follow up with Gen Surg's  recommendations.    They would like cardiology evaluation prior to operation.  Huntington's disease (09/23/2011) Stable  Atrial fibrillation (09/23/2011) Stable currently, will continue to monitor.  Pyelonephritis (11/24/2011) Pt is currently on cipro and is afebrile.  Hypothyroidism (11/24/2011) Continue current regimen.     LOS: 2 days   Penny Pia M.D.  [facility] 11/26/2011, 6:07 PM

## 2011-11-26 NOTE — Consult Note (Signed)
Reason for Consult:  Pre-Op Clearance Referring Physician:   JEFFIE WIDDOWSON is an 77 y.o. female.  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 This is been managed medically. The patient's ejection fraction was normal also in 2006.  History also includes hyperlipidemia, palpitations, hypothyroidism and paroxysmal atrial fibrillation.  The patient was last seen by Dr. Clarene Duke on 09/05/2010 at that time her palpitations are under reasonable control with 0.25 mg of Lanoxin.  Her weight during the office visit was 106 pounds.  Currently she is 123 pounds.  She is status post biliary drainage tube placement in February 2013. We have been asked to see the patient for pre-Op clearance for possible cholecystectomy on Friday, May 3.  She denies chest pain, shortness of breath, palpitations, PND, orthopnea, lower extremity edema.    Past Medical History  Diagnosis Date  . Huntington disease   . Atrial fibrillation   . Chronic respiratory failure     nocturnal oxygen  . Complication of anesthesia     felt surgery with epidural  . GERD (gastroesophageal reflux disease)   . Thyroid disease     Past Surgical History  Procedure Date  . Colon surgery   . Abdominal hysterectomy   . Hemorrhoid surgery   . Tonsillectomy   . Appendectomy     Family History  Problem Relation Age of Onset  . Huntington's disease    . Diabetes type II    . Coronary artery disease    . Heart disease Mother   . Stroke Father   . Heart disease Brother     Social History:  reports that she quit smoking about 13 years ago. She has never used smokeless tobacco. She reports that she does not drink alcohol or use illicit drugs.  Allergies:  Allergies  Allergen Reactions  . Codeine Itching  . Penicillins Cross Reactors Hives    Medications:     . aspirin EC  81 mg Oral  Daily  . ciprofloxacin  400 mg Intravenous Q12H  . digoxin  250 mcg Oral Daily  . docusate sodium  100 mg Oral BID  . FLUoxetine  20 mg Oral Daily  . haloperidol  0.5 mg Oral BID  . heparin  5,000 Units Subcutaneous Q8H  . levothyroxine  50 mcg Oral Q breakfast  . pantoprazole  40 mg Oral Q1200  . senna  1 tablet Oral BID  . sodium chloride  3 mL Intravenous Q12H     Results for orders placed during the hospital encounter of 11/24/11 (from the past 48 hour(s))  CBC     Status: Abnormal   Collection Time   11/24/11  4:49 PM      Component Value Range Comment   WBC 17.3 (*) 4.0 - 10.5 (K/uL)    RBC 4.43  3.87 - 5.11 (MIL/uL)    Hemoglobin 13.1  12.0 - 15.0 (g/dL)    HCT 16.1  09.6 - 04.5 (%)    MCV 90.3  78.0 - 100.0 (fL)    MCH 29.6  26.0 - 34.0 (pg)    MCHC 32.8  30.0 - 36.0 (g/dL)    RDW 40.9  81.1 - 91.4 (%)    Platelets 308  150 - 400 (K/uL)   DIFFERENTIAL     Status: Abnormal   Collection Time   11/24/11  4:49 PM  Component Value Range Comment   Neutrophils Relative 87 (*) 43 - 77 (%)    Neutro Abs 15.0 (*) 1.7 - 7.7 (K/uL)    Lymphocytes Relative 6 (*) 12 - 46 (%)    Lymphs Abs 1.0  0.7 - 4.0 (K/uL)    Monocytes Relative 7  3 - 12 (%)    Monocytes Absolute 1.2 (*) 0.1 - 1.0 (K/uL)    Eosinophils Relative 1  0 - 5 (%)    Eosinophils Absolute 0.1  0.0 - 0.7 (K/uL)    Basophils Relative 0  0 - 1 (%)    Basophils Absolute 0.0  0.0 - 0.1 (K/uL)   COMPREHENSIVE METABOLIC PANEL     Status: Abnormal   Collection Time   11/24/11  4:49 PM      Component Value Range Comment   Sodium 138  135 - 145 (mEq/L)    Potassium 3.7  3.5 - 5.1 (mEq/L)    Chloride 102  96 - 112 (mEq/L)    CO2 28  19 - 32 (mEq/L)    Glucose, Bld 117 (*) 70 - 99 (mg/dL)    BUN 20  6 - 23 (mg/dL)    Creatinine, Ser 1.61  0.50 - 1.10 (mg/dL)    Calcium 8.5  8.4 - 10.5 (mg/dL)    Total Protein 6.6  6.0 - 8.3 (g/dL)    Albumin 2.9 (*) 3.5 - 5.2 (g/dL)    AST 12  0 - 37 (U/L)    ALT 17  0 - 35 (U/L)     Alkaline Phosphatase 115  39 - 117 (U/L)    Total Bilirubin 0.8  0.3 - 1.2 (mg/dL)    GFR calc non Af Amer >90  >90 (mL/min)    GFR calc Af Amer >90  >90 (mL/min)   LIPASE, BLOOD     Status: Abnormal   Collection Time   11/24/11  4:49 PM      Component Value Range Comment   Lipase 9 (*) 11 - 59 (U/L)   URINALYSIS, ROUTINE W REFLEX MICROSCOPIC     Status: Abnormal   Collection Time   11/24/11  5:03 PM      Component Value Range Comment   Color, Urine AMBER (*) YELLOW  BIOCHEMICALS MAY BE AFFECTED BY COLOR   APPearance CLOUDY (*) CLEAR     Specific Gravity, Urine 1.039 (*) 1.005 - 1.030     pH 6.0  5.0 - 8.0     Glucose, UA NEGATIVE  NEGATIVE (mg/dL)    Hgb urine dipstick LARGE (*) NEGATIVE     Bilirubin Urine SMALL (*) NEGATIVE     Ketones, ur NEGATIVE  NEGATIVE (mg/dL)    Protein, ur 30 (*) NEGATIVE (mg/dL)    Urobilinogen, UA 1.0  0.0 - 1.0 (mg/dL)    Nitrite POSITIVE (*) NEGATIVE     Leukocytes, UA MODERATE (*) NEGATIVE    URINE MICROSCOPIC-ADD ON     Status: Abnormal   Collection Time   11/24/11  5:03 PM      Component Value Range Comment   Squamous Epithelial / LPF FEW (*) RARE     WBC, UA 11-20  <3 (WBC/hpf)    RBC / HPF 7-10  <3 (RBC/hpf)    Bacteria, UA MANY (*) RARE    URINE CULTURE     Status: Normal (Preliminary result)   Collection Time   11/24/11  5:03 PM      Component Value Range Comment   Specimen  Description URINE, CATHETERIZED      Special Requests NONE      Culture  Setup Time [id number]      Colony Count PENDING      Culture Culture reincubated for better growth      Report Status PENDING     DIGOXIN LEVEL     Status: Normal   Collection Time   11/24/11  8:46 PM      Component Value Range Comment   Digoxin Level 1.3  0.8 - 2.0 (ng/mL)   TSH     Status: Normal   Collection Time   11/24/11  9:30 PM      Component Value Range Comment   TSH 1.857  0.350 - 4.500 (uIU/mL)   BASIC METABOLIC PANEL     Status: Abnormal   Collection Time   11/25/11  5:33  AM      Component Value Range Comment   Sodium 136  135 - 145 (mEq/L)    Potassium 3.6  3.5 - 5.1 (mEq/L)    Chloride 100  96 - 112 (mEq/L)    CO2 24  19 - 32 (mEq/L)    Glucose, Bld 109 (*) 70 - 99 (mg/dL)    BUN 19  6 - 23 (mg/dL)    Creatinine, Ser 9.56  0.50 - 1.10 (mg/dL)    Calcium 8.6  8.4 - 10.5 (mg/dL)    GFR calc non Af Amer 89 (*) >90 (mL/min)    GFR calc Af Amer >90  >90 (mL/min)   CBC     Status: Abnormal   Collection Time   11/25/11  5:33 AM      Component Value Range Comment   WBC 14.5 (*) 4.0 - 10.5 (K/uL)    RBC 4.18  3.87 - 5.11 (MIL/uL)    Hemoglobin 12.5  12.0 - 15.0 (g/dL)    HCT 21.3  08.6 - 57.8 (%)    MCV 90.2  78.0 - 100.0 (fL)    MCH 29.9  26.0 - 34.0 (pg)    MCHC 33.2  30.0 - 36.0 (g/dL)    RDW 46.9  62.9 - 52.8 (%)    Platelets 312  150 - 400 (K/uL)   CBC     Status: Abnormal   Collection Time   11/26/11  5:15 AM      Component Value Range Comment   WBC 8.1  4.0 - 10.5 (K/uL)    RBC 4.03  3.87 - 5.11 (MIL/uL)    Hemoglobin 11.9 (*) 12.0 - 15.0 (g/dL)    HCT 41.3  24.4 - 01.0 (%)    MCV 90.8  78.0 - 100.0 (fL)    MCH 29.5  26.0 - 34.0 (pg)    MCHC 32.5  30.0 - 36.0 (g/dL)    RDW 27.2  53.6 - 64.4 (%)    Platelets 289  150 - 400 (K/uL)   COMPREHENSIVE METABOLIC PANEL     Status: Abnormal   Collection Time   11/26/11  5:15 AM      Component Value Range Comment   Sodium 137  135 - 145 (mEq/L)    Potassium 3.2 (*) 3.5 - 5.1 (mEq/L)    Chloride 102  96 - 112 (mEq/L)    CO2 27  19 - 32 (mEq/L)    Glucose, Bld 109 (*) 70 - 99 (mg/dL)    BUN 12  6 - 23 (mg/dL)    Creatinine, Ser 0.34  0.50 - 1.10 (mg/dL)    Calcium  8.0 (*) 8.4 - 10.5 (mg/dL)    Total Protein 5.8 (*) 6.0 - 8.3 (g/dL)    Albumin 2.4 (*) 3.5 - 5.2 (g/dL)    AST 10  0 - 37 (U/L)    ALT 13  0 - 35 (U/L)    Alkaline Phosphatase 112  39 - 117 (U/L)    Total Bilirubin 0.5  0.3 - 1.2 (mg/dL)    GFR calc non Af Amer 87 (*) >90 (mL/min)    GFR calc Af Amer >90  >90 (mL/min)     Dg  Cholangiogram Post Op  11/26/2011  *RADIOLOGY REPORT*  Clinical Data:Preop cholangiogram  CATHETER CHOLANGIOGRAM  Fluoroscopy Time: 0.54 minutes  Comparison: CT 09/23/2011  Findings: 18 ml of contrast was injected through the cholecystostomy tube.  Contrast fills a contracted gallbladder and demonstrates multiple small calculi within the lumen.  Contrast flows into the common bile duct and duodenum without evidence of obstruction.  There is no persistent filling defect within the common bile duct.  No evidence of intrahepatic biliary ductal dilatation.  IMPRESSION:  1.  Cholelithiasis. 2.  No evidence of filling defect within in or obstruction of the common bile duct.  Original Report Authenticated By: Genevive Bi, M.D.    Review of Systems  Constitutional: Negative for fever and diaphoresis.  HENT: Negative for congestion and sore throat.   Eyes: Negative for blurred vision and double vision.  Respiratory: Negative for cough and shortness of breath.   Cardiovascular: Negative for chest pain, palpitations, orthopnea, leg swelling and PND.  Gastrointestinal: Positive for nausea, vomiting (Yesterday), abdominal pain and constipation (No BM for two days.). Negative for blood in stool and melena.  Genitourinary: Positive for hematuria (Clearing up.).   Blood pressure 125/55, pulse 79, temperature 98 F (36.7 C), temperature source Oral, resp. rate 18, height 5\' 6"  (1.676 m), weight 55.8 kg (123 lb 0.3 oz), SpO2 97.00%. Physical Exam  Constitutional: She is oriented to person, place, and time. No distress.  HENT:  Head: Normocephalic and atraumatic.  Eyes: EOM are normal. Pupils are equal, round, and reactive to light.  Neck: Normal range of motion.  Cardiovascular: Normal rate and regular rhythm.   No murmur heard. Respiratory: Effort normal and breath sounds normal. She has no wheezes.  GI: Soft. Bowel sounds are normal. There is tenderness.  Musculoskeletal:       No LEE     Lymphadenopathy:    She has no cervical adenopathy.  Neurological: She is alert and oriented to person, place, and time.  Skin: Skin is dry.  Psychiatric: She has a normal mood and affect.    Assessment/Plan: Patient Active Hospital Problem List: Cholecystitis with cholelithiasis s/p percutaneous drain 2/28 (09/24/2011) Huntington's disease (09/23/2011) Atrial fibrillation (09/23/2011) Pyelonephritis (11/24/2011) Constipation (11/24/2011) Hypothyroidism (11/24/2011)  Plan:  The patient is a 77 year old female with a history of Huntington's disease atrial fibrillation. She has no history of coronary artery disease and her last echocardiogram was in 2006 which showed normal LV function.  She has no signs or symptoms of ischemic heart disease at this time.  She is maintaining NSR at a rate of 84BPM.  EKG from February 26 shows LVH with repolarization abnormality(ST depression)in the anteriolateral leads. We will repeat EKG this evening.  BP and HR well controlled.  MD opinion to follow.    HAGER,BRYAN W 11/26/2011, 4:34 PM    Patient seen and examined. Agree with assessment and plan. Very pleasant 77 yo WF with Huntington's chorea who is  followed by Dr. Clarene Duke.  There is no history of CAD or CHF. Remote echo has suggested a possible small PFO which has been stable. She has a history of palpitations which have been controlled. ECG today  Shows previously noted  T wave abnormalities in the setting of LVH with mild RV conduction delay. PE notable for pectus excavatum chest deformity with clear lungs and 1/6 soft murmur. Percutaneous drain in place with good draining in RUQ. Pt is clinically stable from cardiac standpoint to proceed with planned surgery. Rec post op ECG.   Lennette Bihari, MD, [facility] 11/26/2011 5:48 PM

## 2011-11-27 DIAGNOSIS — R1084 Generalized abdominal pain: Secondary | ICD-10-CM

## 2011-11-27 DIAGNOSIS — K811 Chronic cholecystitis: Secondary | ICD-10-CM

## 2011-11-27 DIAGNOSIS — E782 Mixed hyperlipidemia: Secondary | ICD-10-CM

## 2011-11-27 DIAGNOSIS — R112 Nausea with vomiting, unspecified: Secondary | ICD-10-CM

## 2011-11-27 DIAGNOSIS — N390 Urinary tract infection, site not specified: Secondary | ICD-10-CM | POA: Diagnosis present

## 2011-11-27 NOTE — Progress Notes (Signed)
Still with some abd pain. Appreciate cards eval.  I discussed with her neurologist who is in agreement with plan.  Will plan for lap chole tomorrow.  We again discussed risks which are not insignificant.  She and her caregiver are both aware.

## 2011-11-27 NOTE — Progress Notes (Signed)
Room   1512 - Sydney [name] [facility]  [facility] RN Visit  NON-Related admission to [facility] diagnosis of Huntington's.  Pt is DNR code.    Pt alert & oriented, lying in bed, with no  complaints of pain or discomfort.    No family present.   Staff RN present, charting.  Per [facility], pt to have laparoscopic cholecystectomy vs open procedure tomorrow 5/3 with risks outlined by surgeon.  Both pt and caregiver understand and would like to proceed with surgery per notes.      Please call [facility] @ [phone] with any needs.  Thank you.  Joneen Boers, RN  [facility]  Hospice Liaison

## 2011-11-27 NOTE — Progress Notes (Signed)
Subjective: No acute issues overnight.  Patient has no new complaints.  Denies any current abdominal discomfort.  Denies fever, chills, nausea.  Objective: Filed Vitals:   11/26/11 2207 11/26/11 2208 11/27/11 0516 11/27/11 1400  BP: 148/75  140/74 131/65  Pulse: 92  97 78  Temp: 100.2 F (37.9 C) 99.6 F (37.6 C) 98.4 F (36.9 C) 98.9 F (37.2 C)  TempSrc: Oral Axillary Oral Oral  Resp: 17  18 16   Height:      Weight:      SpO2: 97%  94% 97%   Weight change:   Intake/Output Summary (Last 24 hours) at 11/27/11 1736 Last data filed at 11/27/11 1400  Gross per 24 hour  Intake   1600 ml  Output   1450 ml  Net    150 ml    General: Alert, awake, in no acute distress.  HEENT: No bruits, no goiter.  Heart: Regular rate and rhythm, without murmurs, rubs, gallops.  Lungs: Clear to auscultation  Abdomen: Soft, nontender, nondistended, positive bowel sounds.  Neuro: Patient responds to questions but limited response.    Lab Results:  [facility] 11/26/11 0515 11/25/11 0533  NA 137 136  K 3.2* 3.6  CL 102 100  CO2 27 24  GLUCOSE 109* 109*  BUN 12 19  CREATININE 0.61 0.56  CALCIUM 8.0* 8.6  MG -- --  PHOS -- --    Basename 11/26/11 0515  AST 10  ALT 13  ALKPHOS 112  BILITOT 0.5  PROT 5.8*  ALBUMIN 2.4*   No results found for this basename: LIPASE:2,AMYLASE:2 in the last 72 hours  Basename 11/26/11 0515 11/25/11 0533  WBC 8.1 14.5*  NEUTROABS -- --  HGB 11.9* 12.5  HCT 36.6 37.7  MCV 90.8 90.2  PLT 289 312   No results found for this basename: CKTOTAL:3,CKMB:3,CKMBINDEX:3,TROPONINI:3 in the last 72 hours No components found with this basename: POCBNP:3 No results found for this basename: DDIMER:2 in the last 72 hours No results found for this basename: HGBA1C:2 in the last 72 hours No results found for this basename: CHOL:2,HDL:2,LDLCALC:2,TRIG:2,CHOLHDL:2,LDLDIRECT:2 in the last 72 hours  Basename 11/24/11 2130  TSH 1.857  T4TOTAL --  T3FREE --    THYROIDAB --   No results found for this basename: VITAMINB12:2,FOLATE:2,FERRITIN:2,TIBC:2,IRON:2,RETICCTPCT:2 in the last 72 hours  Micro Results: Recent Results (from the past 240 hour(s))  URINE CULTURE     Status: Normal   Collection Time   11/21/11  1:03 AM      Component Value Range Status Comment   Specimen Description URINE, CATHETERIZED   Final    Special Requests NONE   Final    Culture  Setup Time [id number]   Final    Colony Count NO GROWTH   Final    Culture NO GROWTH   Final    Report Status 11/22/2011 FINAL   Final   URINE CULTURE     Status: Normal (Preliminary result)   Collection Time   11/24/11  5:03 PM      Component Value Range Status Comment   Specimen Description URINE, CATHETERIZED   Final    Special Requests NONE   Final    Culture  Setup Time [id number]   Final    Colony Count >=100,000 COLONIES/ML   Final    Culture GRAM NEGATIVE RODS   Final    Report Status PENDING   Incomplete     Studies/Results: Dg Cholangiogram Post Op  11/26/2011  *RADIOLOGY REPORT*  Clinical Data:Preop  cholangiogram  CATHETER CHOLANGIOGRAM  Fluoroscopy Time: 0.54 minutes  Comparison: CT 09/23/2011  Findings: 18 ml of contrast was injected through the cholecystostomy tube.  Contrast fills a contracted gallbladder and demonstrates multiple small calculi within the lumen.  Contrast flows into the common bile duct and duodenum without evidence of obstruction.  There is no persistent filling defect within the common bile duct.  No evidence of intrahepatic biliary ductal dilatation.  IMPRESSION:  1.  Cholelithiasis. 2.  No evidence of filling defect within in or obstruction of the common bile duct.  Original Report Authenticated By: Genevive Bi, M.D.    Medications: I have reviewed the patient's current medications.  Patient Active Hospital Problem List: Cholecystitis with cholelithiasis s/p percutaneous drain 2/28 (09/24/2011)  Will follow up with Gen Surg's recommendations.  Patient for cholecystectomy tomorrow.  Patient has been cleared for surgery from cardiac standpoint.  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   UTI Pt is currently on cipro and is afebrile. Urine culture has grown out > 100,000 colonies of gram negative organisms.  Sensitivities pending.  Would plan on treating as complicated UTI given patient's history.  Has had 4 days of cipro likely treat for 12 days total.  Hypothyroidism (11/24/2011) Continue current regimen.      LOS: 3 days   Penny Pia M.D.  [facility] 11/27/2011, 5:36 PM

## 2011-11-27 NOTE — Progress Notes (Signed)
Patient ID: Sydney [name], female   DOB: March 13, 1935, 77 y.o.   MRN: [id number]    Subjective: Continues to feel better.  She indicates some abd pain.  Her POA is not in the room today.  Objective: Vital signs in last 24 hours: Temp:  [98 F (36.7 C)-100.2 F (37.9 C)] 98.4 F (36.9 C) (05/02 0516) Pulse Rate:  [77-97] 97  (05/02 0516) Resp:  [17-18] 18  (05/02 0516) BP: (125-148)/(55-75) 140/74 mmHg (05/02 0516) SpO2:  [94 %-97 %] 94 % (05/02 0516) Last BM Date: 11/27/11  Intake/Output from previous day: 05/01 0701 - 05/02 0700 In: 600 [I.V.:400; IV Piggyback:200] Out: 2550 [Urine:1600; Drains:950] Intake/Output this shift: Total I/O In: -  Out: 200 [Drains:200]  GI: tube in place and functional, soft, nontender, well healed RLQ and midline incisions, +BS  Lab Results:   [facility] 11/26/11 0515 11/25/11 0533  WBC 8.1 14.5*  HGB 11.9* 12.5  HCT 36.6 37.7  PLT 289 312   BMET  Basename 11/26/11 0515 11/25/11 0533  NA 137 136  K 3.2* 3.6  CL 102 100  CO2 27 24  GLUCOSE 109* 109*  BUN 12 19  CREATININE 0.61 0.56  CALCIUM 8.0* 8.6   Studies/Results: Dg Cholangiogram Post Op  11/26/2011  *RADIOLOGY REPORT*  Clinical Data:Preop cholangiogram  CATHETER CHOLANGIOGRAM  Fluoroscopy Time: 0.54 minutes  Comparison: CT 09/23/2011  Findings: 18 ml of contrast was injected through the cholecystostomy tube.  Contrast fills a contracted gallbladder and demonstrates multiple small calculi within the lumen.  Contrast flows into the common bile duct and duodenum without evidence of obstruction.  There is no persistent filling defect within the common bile duct.  No evidence of intrahepatic biliary ductal dilatation.  IMPRESSION:  1.  Cholelithiasis. 2.  No evidence of filling defect within in or obstruction of the common bile duct.  Original Report Authenticated By: Genevive Bi, M.D.    Anti-infectives: Anti-infectives     Start     Dose/Rate Route Frequency Ordered Stop   11/24/11 2045   ciprofloxacin (CIPRO) IVPB 400 mg        400 mg 200 mL/hr over 60 Minutes Intravenous Every 12 hours 11/24/11 2044     11/24/11 2045   metroNIDAZOLE (FLAGYL) IVPB 500 mg  Status:  Discontinued        500 mg 100 mL/hr over 60 Minutes Intravenous Every 8 hours 11/24/11 2044 11/25/11 1051   11/24/11 1815   ciprofloxacin (CIPRO) tablet 500 mg        500 mg Oral  Once 11/24/11 1802 11/24/11 1834          Assessment/Plan:   Cholecystitis s/p perc chole  Planning to proceed with Lap Cholecystectomy tomorrow as outlined by Dr. Dwain Sarna.     LOS: 3 days    Darryle Dennie,PA-C Pager [phone] General Trauma Pager [phone]

## 2011-11-27 NOTE — Progress Notes (Signed)
[facility] MSW : Met with patient(pt) and caregiver -Fannie Knee. Pt denied pain during visit. Pt stated that she was ready for her lap cholecystectomy tomorrow. Pt and Fannie Knee understand the risks involved. Pt's pastor has been informed of the surgery and plans to be here to give support tomorrow. Fannie Knee expressed confidence in the surgeon. Fannie Knee aware of the recovery time. MSW offered emotional support and reassurance of continued hospice support to pt and caregiver. Please call with concerns or needs.   Elijio Miles, MSW

## 2011-11-28 ENCOUNTER — Encounter (HOSPITAL_COMMUNITY)
Admission: EM | Disposition: A | Discharge status: Hospice | Payer: Self-pay | Source: Home / Self Care | Attending: Family Medicine

## 2011-11-28 ENCOUNTER — Encounter (HOSPITAL_COMMUNITY): Payer: Self-pay | Admitting: Anesthesiology

## 2011-11-28 ENCOUNTER — Inpatient Hospital Stay (HOSPITAL_COMMUNITY): Payer: Medicare Other | Admitting: Anesthesiology

## 2011-11-28 ENCOUNTER — Encounter (HOSPITAL_COMMUNITY): Payer: Self-pay

## 2011-11-28 DIAGNOSIS — E782 Mixed hyperlipidemia: Secondary | ICD-10-CM

## 2011-11-28 DIAGNOSIS — K811 Chronic cholecystitis: Secondary | ICD-10-CM

## 2011-11-28 DIAGNOSIS — E876 Hypokalemia: Secondary | ICD-10-CM | POA: Diagnosis present

## 2011-11-28 DIAGNOSIS — R112 Nausea with vomiting, unspecified: Secondary | ICD-10-CM

## 2011-11-28 DIAGNOSIS — R1084 Generalized abdominal pain: Secondary | ICD-10-CM

## 2011-11-28 HISTORY — PX: CHOLECYSTECTOMY: SHX55

## 2011-11-28 LAB — COMPREHENSIVE METABOLIC PANEL
ALT: 8 U/L (ref 0–35)
AST: 8 U/L (ref 0–37)
Albumin: 2.4 g/dL — ABNORMAL LOW (ref 3.5–5.2)
Alkaline Phosphatase: 99 U/L (ref 39–117)
Calcium: 8.6 mg/dL (ref 8.4–10.5)
Potassium: 3.1 mEq/L — ABNORMAL LOW (ref 3.5–5.1)
Sodium: 138 mEq/L (ref 135–145)
Total Protein: 6 g/dL (ref 6.0–8.3)

## 2011-11-28 LAB — MRSA PCR SCREENING: MRSA by PCR: NEGATIVE

## 2011-11-28 LAB — CBC
HCT: 35.8 % — ABNORMAL LOW (ref 36.0–46.0)
Hemoglobin: 11.9 g/dL — ABNORMAL LOW (ref 12.0–15.0)
MCH: 29.8 pg (ref 26.0–34.0)
MCHC: 33.2 g/dL (ref 30.0–36.0)
MCV: 89.7 fL (ref 78.0–100.0)
RDW: 13.4 % (ref 11.5–15.5)

## 2011-11-28 LAB — URINE CULTURE: Culture  Setup Time: 201304300242

## 2011-11-28 SURGERY — LAPAROSCOPIC CHOLECYSTECTOMY
Anesthesia: General | Wound class: Clean

## 2011-11-28 MED ORDER — LACTATED RINGERS IV SOLN
INTRAVENOUS | Status: DC | PRN
  Administered 2011-11-28 (×2): 1000 mL via INTRAVENOUS

## 2011-11-28 MED ORDER — LACTATED RINGERS IV SOLN
INTRAVENOUS | Status: DC
  Administered 2011-11-29: 125 mL via INTRAVENOUS
  Administered 2011-11-29: 01:00:00 via INTRAVENOUS

## 2011-11-28 MED ORDER — FENTANYL CITRATE 0.05 MG/ML IJ SOLN
INTRAMUSCULAR | Status: DC | PRN
  Administered 2011-11-28 (×6): 50 ug via INTRAVENOUS

## 2011-11-28 MED ORDER — LACTATED RINGERS IV SOLN
INTRAVENOUS | Status: DC
  Administered 2011-11-28: 1000 mL via INTRAVENOUS

## 2011-11-28 MED ORDER — MIDAZOLAM HCL 5 MG/5ML IJ SOLN
INTRAMUSCULAR | Status: DC | PRN
  Administered 2011-11-28: 1 mg via INTRAVENOUS

## 2011-11-28 MED ORDER — PROPOFOL 10 MG/ML IV EMUL
INTRAVENOUS | Status: DC | PRN
  Administered 2011-11-28: 100 mg via INTRAVENOUS

## 2011-11-28 MED ORDER — FENTANYL CITRATE 0.05 MG/ML IJ SOLN: 25.0000 ug | INTRAMUSCULAR | Status: DC | PRN

## 2011-11-28 MED ORDER — HYDROMORPHONE HCL PF 1 MG/ML IJ SOLN
0.5000 mg | INTRAMUSCULAR | Status: DC | PRN
  Administered 2011-11-28 – 2011-11-30 (×11): 0.5 mg via INTRAVENOUS
  Filled 2011-11-28 (×11): qty 1

## 2011-11-28 MED ORDER — LACTATED RINGERS IV SOLN: INTRAVENOUS | Status: DC

## 2011-11-28 MED ORDER — PANTOPRAZOLE SODIUM 40 MG IV SOLR
40.0000 mg | Freq: Every day | INTRAVENOUS | Status: DC
  Administered 2011-11-28 – 2011-12-02 (×5): 40 mg via INTRAVENOUS
  Filled 2011-11-28 (×7): qty 40

## 2011-11-28 MED ORDER — SODIUM CHLORIDE 0.9 % IV SOLN
250.0000 mg | Freq: Four times a day (QID) | INTRAVENOUS | Status: DC
  Administered 2011-11-28 – 2011-12-02 (×17): 250 mg via INTRAVENOUS
  Filled 2011-11-28 (×22): qty 250

## 2011-11-28 MED ORDER — ACETAMINOPHEN 325 MG PO TABS
650.0000 mg | ORAL_TABLET | Freq: Four times a day (QID) | ORAL | Status: DC | PRN
  Administered 2011-11-30 – 2011-12-01 (×2): 650 mg via ORAL
  Filled 2011-11-28 (×2): qty 2

## 2011-11-28 MED ORDER — LIDOCAINE HCL (CARDIAC) 20 MG/ML IV SOLN
INTRAVENOUS | Status: DC | PRN
  Administered 2011-11-28: 40 mg via INTRAVENOUS

## 2011-11-28 MED ORDER — SODIUM CHLORIDE 0.9 % IV SOLN
INTRAVENOUS | Status: DC
  Administered 2011-11-29: 75 mL via INTRAVENOUS
  Administered 2011-11-30 – 2011-12-02 (×4): via INTRAVENOUS

## 2011-11-28 MED ORDER — POTASSIUM CHLORIDE 10 MEQ/100ML IV SOLN
10.0000 meq | INTRAVENOUS | Status: AC
  Administered 2011-11-28 (×6): 10 meq via INTRAVENOUS
  Filled 2011-11-28 (×3): qty 100

## 2011-11-28 MED ORDER — LACTATED RINGERS IV SOLN
INTRAVENOUS | Status: DC | PRN
  Administered 2011-11-28 (×2): via INTRAVENOUS

## 2011-11-28 MED ORDER — ROCURONIUM BROMIDE 100 MG/10ML IV SOLN
INTRAVENOUS | Status: DC | PRN
  Administered 2011-11-28: 40 mg via INTRAVENOUS

## 2011-11-28 MED ORDER — LACTATED RINGERS IV SOLN
INTRAVENOUS | Status: DC | PRN
  Administered 2011-11-28: 14:00:00 via INTRAVENOUS

## 2011-11-28 MED ORDER — PROMETHAZINE HCL 25 MG/ML IJ SOLN: 6.2500 mg | INTRAMUSCULAR | Status: DC | PRN

## 2011-11-28 MED ORDER — ONDANSETRON HCL 4 MG/2ML IJ SOLN
INTRAMUSCULAR | Status: DC | PRN
  Administered 2011-11-28: 4 mg via INTRAVENOUS

## 2011-11-28 MED ORDER — NEOSTIGMINE METHYLSULFATE 1 MG/ML IJ SOLN
INTRAMUSCULAR | Status: DC | PRN
  Administered 2011-11-28: 3 mg via INTRAVENOUS

## 2011-11-28 MED ORDER — ACETAMINOPHEN 650 MG RE SUPP: 650.0000 mg | Freq: Four times a day (QID) | RECTAL | Status: DC | PRN

## 2011-11-28 MED ORDER — GLYCOPYRROLATE 0.2 MG/ML IJ SOLN
INTRAMUSCULAR | Status: DC | PRN
  Administered 2011-11-28: .5 mg via INTRAVENOUS

## 2011-11-28 SURGICAL SUPPLY — 41 items
ADH SKN CLS APL DERMABOND .7 (GAUZE/BANDAGES/DRESSINGS) ×1
APL SKNCLS STERI-STRIP NONHPOA (GAUZE/BANDAGES/DRESSINGS)
APPLIER CLIP 5 13 M/L LIGAMAX5 (MISCELLANEOUS) ×2
APPLIER CLIP ROT 10 11.4 M/L (STAPLE)
APR CLP MED LRG 11.4X10 (STAPLE)
APR CLP MED LRG 5 ANG JAW (MISCELLANEOUS) ×1
BAG SPEC RTRVL LRG 6X4 10 (ENDOMECHANICALS) ×1
BENZOIN TINCTURE PRP APPL 2/3 (GAUZE/BANDAGES/DRESSINGS) IMPLANT
CANISTER SUCTION 2500CC (MISCELLANEOUS) ×2 IMPLANT
CLIP APPLIE 5 13 M/L LIGAMAX5 (MISCELLANEOUS) ×1 IMPLANT
CLIP APPLIE ROT 10 11.4 M/L (STAPLE) IMPLANT
CLOTH BEACON ORANGE TIMEOUT ST (SAFETY) ×2 IMPLANT
COVER MAYO STAND STRL (DRAPES) IMPLANT
DECANTER SPIKE VIAL GLASS SM (MISCELLANEOUS) ×2 IMPLANT
DERMABOND ADVANCED (GAUZE/BANDAGES/DRESSINGS) ×1
DERMABOND ADVANCED .7 DNX12 (GAUZE/BANDAGES/DRESSINGS) IMPLANT
DRAPE C-ARM 42X72 X-RAY (DRAPES) IMPLANT
DRAPE LAPAROSCOPIC ABDOMINAL (DRAPES) ×2 IMPLANT
ELECT REM PT RETURN 9FT ADLT (ELECTROSURGICAL) ×2
ELECTRODE REM PT RTRN 9FT ADLT (ELECTROSURGICAL) ×1 IMPLANT
GLOVE BIO SURGEON STRL SZ7 (GLOVE) ×2 IMPLANT
GLOVE BIOGEL PI IND STRL 7.5 (GLOVE) ×1 IMPLANT
GLOVE BIOGEL PI INDICATOR 7.5 (GLOVE) ×1
GOWN PREVENTION PLUS LG XLONG (DISPOSABLE) ×2 IMPLANT
GOWN PREVENTION PLUS XLARGE (GOWN DISPOSABLE) ×2 IMPLANT
GOWN STRL NON-REIN LRG LVL3 (GOWN DISPOSABLE) ×2 IMPLANT
GOWN STRL REIN XL XLG (GOWN DISPOSABLE) ×2 IMPLANT
KIT BASIN OR (CUSTOM PROCEDURE TRAY) ×2 IMPLANT
NS IRRIG 1000ML POUR BTL (IV SOLUTION) ×2 IMPLANT
POUCH SPECIMEN RETRIEVAL 10MM (ENDOMECHANICALS) ×2 IMPLANT
SET CHOLANGIOGRAPH MIX (MISCELLANEOUS) IMPLANT
SET IRRIG TUBING LAPAROSCOPIC (IRRIGATION / IRRIGATOR) ×2 IMPLANT
SOLUTION ANTI FOG 6CC (MISCELLANEOUS) ×2 IMPLANT
STRIP CLOSURE SKIN 1/2X4 (GAUZE/BANDAGES/DRESSINGS) IMPLANT
SUT MNCRL AB 4-0 PS2 18 (SUTURE) ×2 IMPLANT
TOWEL OR 17X26 10 PK STRL BLUE (TOWEL DISPOSABLE) ×2 IMPLANT
TRAY LAP CHOLE (CUSTOM PROCEDURE TRAY) ×2 IMPLANT
TROCAR BLADELESS OPT 5 75 (ENDOMECHANICALS) ×7 IMPLANT
TROCAR XCEL BLUNT TIP 100MML (ENDOMECHANICALS) ×2 IMPLANT
TROCAR XCEL NON-BLD 11X100MML (ENDOMECHANICALS) ×1 IMPLANT
TUBING INSUFFLATION 10FT LAP (TUBING) ×2 IMPLANT

## 2011-11-28 NOTE — Anesthesia Postprocedure Evaluation (Signed)
Anesthesia Post Note  Patient: Sydney [name]  Procedure(s) Performed: Procedure(s) (LRB): LAPAROSCOPIC CHOLECYSTECTOMY (N/A)  Anesthesia type: General  Patient location: PACU  Post pain: Pain level controlled  Post assessment: Post-op Vital signs reviewed  Last Vitals:  Filed Vitals:   11/28/11 1730  BP: 139/61  Pulse: 77  Temp:   Resp: 13    Post vital signs: Reviewed  Level of consciousness: sedated  Complications: No apparent anesthesia complications

## 2011-11-28 NOTE — Transfer of Care (Signed)
Immediate Anesthesia Transfer of Care Note  Patient: Sydney [name]  Procedure(s) Performed: Procedure(s) (LRB): LAPAROSCOPIC CHOLECYSTECTOMY (N/A)  Patient Location: PACU  Anesthesia Type: General  Level of Consciousness: sedated, patient cooperative and responds to stimulaton  Airway & Oxygen Therapy: Patient Spontanous Breathing and Patient connected to face mask oxgen  Post-op Assessment: Report given to PACU RN and Post -op Vital signs reviewed and stable  Post vital signs: Reviewed and stable  Complications: No apparent anesthesia complications

## 2011-11-28 NOTE — Progress Notes (Signed)
ANTIBIOTIC CONSULT NOTE - INITIAL  Pharmacy Consult for Primaxin Indication: UTI  Allergies  Allergen Reactions  . Codeine Itching  . Penicillins Cross Reactors Hives    Patient Measurements: Height: 5\' 6"  (167.6 cm) Weight: 123 lb 0.3 oz (55.8 kg) IBW/kg (Calculated) : 59.3   Vital Signs: Temp: 98.7 F (37.1 C) (05/03 0551) Temp src: Oral (05/03 0551) BP: 134/64 mmHg (05/03 0551) Pulse Rate: 78  (05/03 0551) Intake/Output from previous day: 05/02 0701 - 05/03 0700 In: 2500 [I.V.:1900; IV Piggyback:600] Out: 1115 [Urine:850; Drains:265] Intake/Output from this shift: Total I/O In: -  Out: 150 [Urine:150]  Labs:  [facility] 11/28/11 0512 11/26/11 0515  WBC 9.7 8.1  HGB 11.9* 11.9*  PLT 349 289  LABCREA -- --  CREATININE 0.59 0.61   Estimated Creatinine Clearance: 53.5 ml/min (by C-G formula based on Cr of 0.59).    Microbiology: 4/29 urine culture: E.coli (MDR, sensitive to gentamicin and imipenem only), Klebsiella pneumoniae (MDR, sensitive to imipenem only)  Assessment: 77 YOF with multi-drug resistant E.Coli and Klebsiella pneumoniae UTI.  Patient has been on Cipro since admission, now will switch to Primaxin with sensitivity results.  CrCl~69 mL/min (normalized, using adjusted SCr 0.8 for age).  Aware of patient's listed allergy of hives to penicillin cross reactors.  Should be low incidence of cross reactivity with Primaxin.  Will monitor and continue with order considering that Primaxin was the only abx tested that both cultures were sensitive to.  Goal of Therapy:  doses adjusted per renal clearance  Plan:  Primaxin 250 mg IV q6h. F/u SCr.  Clance Boll 11/28/2011,11:10 AM

## 2011-11-28 NOTE — Progress Notes (Signed)
ESBL noted in urine Dr Cena Benton sent text page and informed.

## 2011-11-28 NOTE — Progress Notes (Signed)
OR called spoke with Lanora Manis informed of positive ESBL results in urine.

## 2011-11-28 NOTE — Interval H&P Note (Signed)
History and Physical Interval Note:  11/28/2011 2:07 PM  Sydney [name]  has presented today for surgery, with the diagnosis of cholecystitis   The various methods of treatment have been discussed with the patient and family. After consideration of risks, benefits and other options for treatment, the patient has consented to  Procedure(s) (LRB): LAPAROSCOPIC CHOLECYSTECTOMY (N/A) as a surgical intervention .  The patients' history has been reviewed, patient examined, no change in status, stable for surgery.  I have reviewed the patients' chart and labs.  Questions were answered to the patient's satisfaction.     Ileen Kahre

## 2011-11-28 NOTE — Progress Notes (Addendum)
Subjective:  Patient has been resting comfortably.  Has had some transient abdominal discomfort.  Otherwise no acute complaints.  Objective: Filed Vitals:   11/26/11 2208 11/27/11 0516 11/27/11 1400 11/28/11 0551  BP:  140/74 131/65 134/64  Pulse:  97 78 78  Temp: 99.6 F (37.6 C) 98.4 F (36.9 C) 98.9 F (37.2 C) 98.7 F (37.1 C)  TempSrc: Axillary Oral Oral Oral  Resp:  18 16 17   Height:      Weight:      SpO2:  94% 97% 98%   Weight change:   Intake/Output Summary (Last 24 hours) at 11/28/11 1053 Last data filed at 11/28/11 1000  Gross per 24 hour  Intake   2500 ml  Output   1065 ml  Net   1435 ml    General: Alert, awake, in no acute distress.  HEENT: No bruits, no goiter.  Heart: Regular rate and rhythm, without murmurs, rubs, gallops.  Lungs: Clear to auscultation, bilateral air movement.  Abdomen: Soft, nontender, nondistended, positive bowel sounds.  Neuro: Grossly intact, nonfocal.   Lab Results:  [facility] 11/28/11 0512 11/26/11 0515  NA 138 137  K 3.1* 3.2*  CL 101 102  CO2 29 27  GLUCOSE 113* 109*  BUN 5* 12  CREATININE 0.59 0.61  CALCIUM 8.6 8.0*  MG 1.9 --  PHOS -- --    Basename 11/28/11 0512 11/26/11 0515  AST 8 10  ALT 8 13  ALKPHOS 99 112  BILITOT 0.4 0.5  PROT 6.0 5.8*  ALBUMIN 2.4* 2.4*   No results found for this basename: LIPASE:2,AMYLASE:2 in the last 72 hours  Basename 11/28/11 0512 11/26/11 0515  WBC 9.7 8.1  NEUTROABS -- --  HGB 11.9* 11.9*  HCT 35.8* 36.6  MCV 89.7 90.8  PLT 349 289   No results found for this basename: CKTOTAL:3,CKMB:3,CKMBINDEX:3,TROPONINI:3 in the last 72 hours No components found with this basename: POCBNP:3 No results found for this basename: DDIMER:2 in the last 72 hours No results found for this basename: HGBA1C:2 in the last 72 hours No results found for this basename: CHOL:2,HDL:2,LDLCALC:2,TRIG:2,CHOLHDL:2,LDLDIRECT:2 in the last 72 hours No results found for this basename:  TSH,T4TOTAL,FREET3,T3FREE,THYROIDAB in the last 72 hours No results found for this basename: VITAMINB12:2,FOLATE:2,FERRITIN:2,TIBC:2,IRON:2,RETICCTPCT:2 in the last 72 hours  Micro Results: Recent Results (from the past 240 hour(s))  URINE CULTURE     Status: Normal   Collection Time   11/21/11  1:03 AM      Component Value Range Status Comment   Specimen Description URINE, CATHETERIZED   Final    Special Requests NONE   Final    Culture  Setup Time [id number]   Final    Colony Count NO GROWTH   Final    Culture NO GROWTH   Final    Report Status 11/22/2011 FINAL   Final   URINE CULTURE     Status: Normal   Collection Time   11/24/11  5:03 PM      Component Value Range Status Comment   Specimen Description URINE, CATHETERIZED   Final    Special Requests NONE   Final    Culture  Setup Time [id number]   Final    Colony Count >=100,000 COLONIES/ML   Final    Culture     Final    Value: ESCHERICHIA COLI     Note: CRITICAL RESULT CALLED TO, READ BACK BY AND VERIFIED WITH: TOM R AT 0654 11/28/11 BY SNOLO     KLEBSIELLA PNEUMONIAE  Note: Confirmed Extended Spectrum Beta-Lactamase Producer (ESBL) CRITICAL RESULT CALLED TO, READ BACK BY AND VERIFIED WITH: TOM R AT 0655 11/28/11 BY SNOLO   Report Status 11/28/2011 FINAL   Final    Organism ID, Bacteria ESCHERICHIA COLI   Final    Organism ID, Bacteria KLEBSIELLA PNEUMONIAE   Final   MRSA PCR SCREENING     Status: Normal   Collection Time   11/28/11  7:34 AM      Component Value Range Status Comment   MRSA by PCR NEGATIVE  NEGATIVE  Final     Studies/Results: Dg Cholangiogram Post Op  11/26/2011  *RADIOLOGY REPORT*  Clinical Data:Preop cholangiogram  CATHETER CHOLANGIOGRAM  Fluoroscopy Time: 0.54 minutes  Comparison: CT 09/23/2011  Findings: 18 ml of contrast was injected through the cholecystostomy tube.  Contrast fills a contracted gallbladder and demonstrates multiple small calculi within the lumen.  Contrast flows into the common bile  duct and duodenum without evidence of obstruction.  There is no persistent filling defect within the common bile duct.  No evidence of intrahepatic biliary ductal dilatation.  IMPRESSION:  1.  Cholelithiasis. 2.  No evidence of filling defect within in or obstruction of the common bile duct.  Original Report Authenticated By: Genevive Bi, M.D.    Medications: I have reviewed the patient's current medications.  Patient Active Hospital Problem List: Cholecystitis with cholelithiasis s/p percutaneous drain 2/28 (09/24/2011)  Will follow up with Gen Surg's recommendations. Patient for cholecystectomy today. Patient has been cleared for surgery from cardiac standpoint.   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   UTI Pt is currently on cipro and is afebrile.  Urine culture has grown out > 100,000 colonies of gram negative highly resistant E coli. Currently is sensitive for imipenem.  Will switch from cipro to imipenem  Hypothyroidism (11/24/2011) Continue current regimen.  Disposition:  Pending post surgical course.     LOS: 4 days   Penny Pia M.D.  [facility] 11/28/2011, 10:53 AM  Addendum: Hypokalemia:  Will plan on replacing today.

## 2011-11-28 NOTE — Op Note (Signed)
Preoperative diagnosis: Symptomatic cholelithiasis status post percutaneous cholecystostomy Postoperative diagnosis: Same as above Procedure: #1 laparoscopic lysis of adhesions times for 30 minutes #2 laparoscopic cholecystectomy Surgeon: Dr. Harden Mo Anesthesia: Gen. Endotracheal Specimens: Gallbladder and contents to pathology Estimated blood loss 50 cc Complications: None Drains: None Sponge needle count was correct x2 at the end of the operation Disposition of patient to recovery in stable condition  Indications: This is a 77 year old female who underwent placement of a percutaneous cholecystostomy tube a couple months ago by one of my partners. She has advanced Huntington's disease. She also has some cardiac comorbidities. She has been seen by cardiology and cleared for surgery. Off also discussed this case with her neurologist who agrees that a cholecystectomy would be the best plan. She and I and her caregiver had multiple conversations about the risks and benefits of surgery we've decided to proceed. We discussed a laparoscopic possible open cholecystectomy.  Procedure: After informed consent was obtained the patient was taken to the operating room. She was on a therapeutic regimen of antibiotics on the floor. Sequential compression devices were placed on her legs prior to beginning. She was then placed under general anesthesia without complication. Her abdomen was then prepped and draped in the standard sterile surgical fashion. A surgical timeout was then performed.  Due to her prior colon surgery I elected to infiltrate quarter percent Marcaine in the left upper quadrant. I then made an incision with an 11 blade. I entered the oblique. I then grasped the peritoneum and in her abdomen with Metzenbaum scissors. I then inserted a 5 mm trocar under direct vision. I then insufflated the abdomen to 15 mm mercury pressure. She had very large wall of what was mostly her omentum adherent to  her abdominal wall. I then placed 2 further 5 mm trocars on the left side of the abdomen under direct vision. I then spent about 30 minutes lysing adhesions to be able to get to the gallbladder. This was done with a combination of sharp dissection and cautery. Once I had done this I placed a 10 mm trocar at the umbilicus. I placed 3 other trocars in the right upper quadrant and epigastrium. The gallbladder was then identified. I cut the drain at this point. I then removed the liver from the anterior abdominal wall using blunt dissection. The gallbladder was then retracted cephalad and lateral. It took me about an hour but eventually I was able to obtain the critical view of safety. I did not end up doing a cholangiogram due to the fact her duct was short and this was all very friable. I was sure of my anatomy at this point. I then clipped the duct 3 times and divided this. I was worried about stones spilling of which there were a number of so I placed a tie around the gallbladder. I then used the clip applier to clip the artery. This was also divided. I then removed the gallbladder with some difficulty from the liver bed. I did make an entrance into the gallbladder but no stones were spilled. Eventually I was able to remove the gallbladder from the liver bed. This was very difficult especially at the site of the prior drain. I then placed an Endo Catch bag and removed it from the umbilicus. Due to the amount of stones and she had to enlarge this a little bit. I then viewed everything and it was hemostatic. I then irrigated this until it was clear. I removed the 10 mm  trocar I closed this with several 0 Vicryl sutures. This completely obliterated the defect. I did the same for my larger incision in the left upper quadrant. The remaining trocars were removed and the abdomen was desufflated. I closed the incisions with Monocryl and Dermabond. She tolerated this well transferred to recovery.

## 2011-11-28 NOTE — Progress Notes (Signed)
[facility] Chaplain visit: Met with pt and close friend/POA Fannie Knee this AM before scheduled surgery.  Pt awake in bed and pleasantly interactive, feeling glad the surgery is finally being done, and understanding the risks involved.  Fannie Knee naturally anxious, not sleeping well last night. Other family and friends checking in for brief support, and pt assuring others she would be OK and was not afraid.  Pt and Fannie Knee thankful for encouragement and prayer of blessing and healing. Lovenia Shuck, [facility] Chaplain

## 2011-11-28 NOTE — Anesthesia Preprocedure Evaluation (Signed)
Anesthesia Evaluation  Patient identified by MRN, date of birth, ID band Patient awake    Reviewed: Allergy & Precautions, H&P , NPO status , Patient's Chart, lab work & pertinent test results  Airway Mallampati: II TM Distance: >3 FB Neck ROM: Full    Dental  (+) Edentulous Upper and Dental Advisory Given   Pulmonary pneumonia  (History of aspiration pneumonitis approx 8 weeks ago), former smoker   + decreased breath sounds      Cardiovascular + dysrhythmias Atrial Fibrillation Rhythm:Regular Rate:Normal     Neuro/Psych Huntingtons Disease with generalized weakness and inability to ambulate. negative psych ROS   GI/Hepatic Neg liver ROS, GERD-  Medicated,  Endo/Other  Hypothyroidism Hypokalemia  Renal/GU negative Renal ROS  negative genitourinary   Musculoskeletal negative musculoskeletal ROS (+)   Abdominal   Peds negative pediatric ROS (+)  Hematology negative hematology ROS (+)   Anesthesia Other Findings   Reproductive/Obstetrics negative OB ROS                           Anesthesia Physical Anesthesia Plan  ASA: III  Anesthesia Plan: General   Post-op Pain Management:    Induction: Intravenous  Airway Management Planned: Oral ETT  Additional Equipment:   Intra-op Plan:   Post-operative Plan: Post-operative intubation/ventilation  Informed Consent: I have reviewed the patients History and Physical, chart, labs and discussed the procedure including the risks, benefits and alternatives for the proposed anesthesia with the patient or authorized representative who has indicated his/her understanding and acceptance.   Dental advisory given  Plan Discussed with: CRNA  Anesthesia Plan Comments:         Anesthesia Quick Evaluation

## 2011-11-29 DIAGNOSIS — K811 Chronic cholecystitis: Secondary | ICD-10-CM

## 2011-11-29 DIAGNOSIS — E782 Mixed hyperlipidemia: Secondary | ICD-10-CM

## 2011-11-29 DIAGNOSIS — R112 Nausea with vomiting, unspecified: Secondary | ICD-10-CM

## 2011-11-29 DIAGNOSIS — R1084 Generalized abdominal pain: Secondary | ICD-10-CM

## 2011-11-29 LAB — COMPREHENSIVE METABOLIC PANEL
ALT: 8 U/L (ref 0–35)
Alkaline Phosphatase: 92 U/L (ref 39–117)
BUN: 5 mg/dL — ABNORMAL LOW (ref 6–23)
CO2: 29 mEq/L (ref 19–32)
GFR calc Af Amer: >90 mL/min (ref >90)
GFR calc non Af Amer: >90 mL/min (ref >90)
Glucose, Bld: 104 mg/dL — ABNORMAL HIGH (ref 70–99)
Potassium: 3.7 mEq/L (ref 3.5–5.1)
Sodium: 135 mEq/L (ref 135–145)

## 2011-11-29 LAB — CBC
MCHC: 32.5 g/dL (ref 30.0–36.0)
Platelets: 386 10*3/uL (ref 150–400)
RDW: 13.7 % (ref 11.5–15.5)
WBC: 8.7 10*3/uL (ref 4.0–10.5)

## 2011-11-29 MED ORDER — BIOTENE DRY MOUTH MT LIQD
15.0000 mL | Freq: Two times a day (BID) | OROMUCOSAL | Status: DC
  Administered 2011-11-29 – 2011-12-03 (×8): 15 mL via OROMUCOSAL

## 2011-11-29 NOTE — Progress Notes (Signed)
1 Day Post-Op  Subjective: Sore but otherwise well this am, resting comfortably  Objective: Vital signs in last 24 hours: Temp:  [97.5 F (36.4 C)-99 F (37.2 C)] 98.3 F (36.8 C) (05/04 0800) Pulse Rate:  [71-80] 74  (05/04 0800) Resp:  [10-18] 12  (05/04 0800) BP: (112-152)/(48-75) 112/48 mmHg (05/04 0800) SpO2:  [96 %-99 %] 97 % (05/04 0800) Weight:  [132 lb 7.9 oz (60.1 kg)] 132 lb 7.9 oz (60.1 kg) (05/03 2000) Last BM Date: 11/27/11  Intake/Output from previous day: 05/03 0701 - 05/04 0700 In: 3377.5 [I.V.:2967.5; IV Piggyback:410] Out: 880 [Urine:880] Intake/Output this shift: Total I/O In: 250 [I.V.:250] Out: -   General appearance: no distress GI: approp tender, wounds clean, soft  Lab Results:   [facility] 11/29/11 0318 11/28/11 0512  WBC 8.7 9.7  HGB 11.3* 11.9*  HCT 34.8* 35.8*  PLT 386 349   BMET  Basename 11/29/11 0318 11/28/11 0512  NA 135 138  K 3.7 3.1*  CL 99 101  CO2 29 29  GLUCOSE 104* 113*  BUN 5* 5*  CREATININE 0.52 0.59  CALCIUM 8.3* 8.6   PT/INR No results found for this basename: LABPROT:2,INR:2 in the last 72 hours ABG No results found for this basename: PHART:2,PCO2:2,PO2:2,HCO3:2 in the last 72 hours  Studies/Results: No results found.  Anti-infectives: Anti-infectives     Start     Dose/Rate Route Frequency Ordered Stop   11/28/11 1200   imipenem-cilastatin (PRIMAXIN) 250 mg in sodium chloride 0.9 % 100 mL IVPB        250 mg 200 mL/hr over 30 Minutes Intravenous 4 times per day 11/28/11 1119     11/24/11 2045   ciprofloxacin (CIPRO) IVPB 400 mg  Status:  Discontinued        400 mg 200 mL/hr over 60 Minutes Intravenous Every 12 hours 11/24/11 2044 11/28/11 1105   11/24/11 2045   metroNIDAZOLE (FLAGYL) IVPB 500 mg  Status:  Discontinued        500 mg 100 mL/hr over 60 Minutes Intravenous Every 8 hours 11/24/11 2044 11/25/11 1051   11/24/11 1815   ciprofloxacin (CIPRO) tablet 500 mg        500 mg Oral  Once 11/24/11 1802  11/24/11 1834          Assessment/Plan: POD 1 lap chole, loa  Can advance diet as tolerated, lfts nl today, oob, on abx for uti does not need for gb pulm toilet   LOS: 5 days    [facility] 11/29/2011

## 2011-11-29 NOTE — Progress Notes (Signed)
Subjective: Patient was smiling this morning.  Stated that she felt well.  Denies any dysuria, fever, chills, focal neurological findings.  No acute issues overnight.  Objective: Filed Vitals:   11/29/11 0600 11/29/11 0800 11/29/11 1000 11/29/11 1200  BP: 115/50 112/48 118/57   Pulse: 80 74 79   Temp:  98.3 F (36.8 C)  98.9 F (37.2 C)  TempSrc:  Oral  Axillary  Resp: 17 12 14    Height:      Weight:      SpO2: 96% 97% 94%    Weight change:   Intake/Output Summary (Last 24 hours) at 11/29/11 1351 Last data filed at 11/29/11 1112  Gross per 24 hour  Intake 3102.5 ml  Output    690 ml  Net 2412.5 ml    General: Alert, awake, oriented x3, in no acute distress.  HEENT: No bruits, no goiter.  Heart: Regular rate and rhythm, without murmurs, rubs, gallops.  Lungs: CTA BL, bilateral air movement.  Abdomen: Soft, incisions intact, nondistended, positive bowel sounds.  Neuro: Grossly intact, nonfocal.   Lab Results:  [facility] 11/29/11 0318 11/28/11 0512  NA 135 138  K 3.7 3.1*  CL 99 101  CO2 29 29  GLUCOSE 104* 113*  BUN 5* 5*  CREATININE 0.52 0.59  CALCIUM 8.3* 8.6  MG -- 1.9  PHOS -- --    Basename 11/29/11 0318 11/28/11 0512  AST 13 8  ALT 8 8  ALKPHOS 92 99  BILITOT 0.4 0.4  PROT 5.5* 6.0  ALBUMIN 2.1* 2.4*   No results found for this basename: LIPASE:2,AMYLASE:2 in the last 72 hours  Basename 11/29/11 0318 11/28/11 0512  WBC 8.7 9.7  NEUTROABS -- --  HGB 11.3* 11.9*  HCT 34.8* 35.8*  MCV 89.7 89.7  PLT 386 349   No results found for this basename: CKTOTAL:3,CKMB:3,CKMBINDEX:3,TROPONINI:3 in the last 72 hours No components found with this basename: POCBNP:3 No results found for this basename: DDIMER:2 in the last 72 hours No results found for this basename: HGBA1C:2 in the last 72 hours No results found for this basename: CHOL:2,HDL:2,LDLCALC:2,TRIG:2,CHOLHDL:2,LDLDIRECT:2 in the last 72 hours No results found for this basename:  TSH,T4TOTAL,FREET3,T3FREE,THYROIDAB in the last 72 hours No results found for this basename: VITAMINB12:2,FOLATE:2,FERRITIN:2,TIBC:2,IRON:2,RETICCTPCT:2 in the last 72 hours  Micro Results: Recent Results (from the past 240 hour(s))  URINE CULTURE     Status: Normal   Collection Time   11/21/11  1:03 AM      Component Value Range Status Comment   Specimen Description URINE, CATHETERIZED   Final    Special Requests NONE   Final    Culture  Setup Time [id number]   Final    Colony Count NO GROWTH   Final    Culture NO GROWTH   Final    Report Status 11/22/2011 FINAL   Final   URINE CULTURE     Status: Normal   Collection Time   11/24/11  5:03 PM      Component Value Range Status Comment   Specimen Description URINE, CATHETERIZED   Final    Special Requests NONE   Final    Culture  Setup Time [id number]   Final    Colony Count >=100,000 COLONIES/ML   Final    Culture     Final    Value: ESCHERICHIA COLI     Note: CRITICAL RESULT CALLED TO, READ BACK BY AND VERIFIED WITH: TOM R AT 0654 11/28/11 BY SNOLO     KLEBSIELLA PNEUMONIAE  Note: Confirmed Extended Spectrum Beta-Lactamase Producer (ESBL) CRITICAL RESULT CALLED TO, READ BACK BY AND VERIFIED WITH: TOM R AT 0655 11/28/11 BY SNOLO   Report Status 11/28/2011 FINAL   Final    Organism ID, Bacteria ESCHERICHIA COLI   Final    Organism ID, Bacteria KLEBSIELLA PNEUMONIAE   Final   MRSA PCR SCREENING     Status: Normal   Collection Time   11/28/11  7:34 AM      Component Value Range Status Comment   MRSA by PCR NEGATIVE  NEGATIVE  Final     Studies/Results: No results found.  Medications: I have reviewed the patient's current medications.  Patient Active Hospital Problem List: Cholecystitis with cholelithiasis s/p percutaneous drain 2/28 (09/24/2011)  Will follow up with Gen Surg's recommendations. Patient for laparascopic cholecystectomy and is post of day 1. .   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   UTI Pt is currently on cipro and is afebrile.  Urine culture has grown out > 100,000 colonies of gram negative highly resistant E coli. Currently is sensitive for imipenem. Will switch from cipro to imipenem.  Will treat for three days of antibiotic given that at this point seems to be more consistent with non complicated UTI given her current complaints.  Hypothyroidism (11/24/2011) Continue current regimen.  Disposition:  To home once cleared by General surgery.   LOS: 5 days   Penny Pia M.D.  [facility] 11/29/2011, 1:51 PM

## 2011-11-29 NOTE — Progress Notes (Signed)
[facility]  DAILY PROGRESS NOTE   Subjective:  Doing well POD #1. Denies chest pain. No arrhythmias noted on telemetry.  Objective:  Temp:  [97.5 F (36.4 C)-99 F (37.2 C)] 98.3 F (36.8 C) (05/04 0800) Pulse Rate:  [71-80] 79  (05/04 1000) Resp:  [10-18] 14  (05/04 1000) BP: (112-152)/(48-75) 118/57 mmHg (05/04 1000) SpO2:  [94 %-99 %] 94 % (05/04 1000) Weight:  [60.1 kg (132 lb 7.9 oz)] 60.1 kg (132 lb 7.9 oz) (05/03 2000) Weight change:   Intake/Output from previous day: 05/03 0701 - 05/04 0700 In: 3477.5 [I.V.:2967.5; IV Piggyback:510] Out: 880 [Urine:880]  Intake/Output from this shift: Total I/O In: 350 [I.V.:250; IV Piggyback:100] Out: 60 [Urine:60]  Medications: Current Facility-Administered Medications  Medication Dose Route Frequency Provider Last Rate Last Dose  . 0.9 %  sodium chloride infusion   Intravenous Continuous Emelia Loron, MD      . acetaminophen (TYLENOL) tablet 650 mg  650 mg Oral Q6H PRN Emelia Loron, MD       Or  . acetaminophen (TYLENOL) suppository 650 mg  650 mg Rectal Q6H PRN Emelia Loron, MD      . ALPRAZolam Prudy Feeler) tablet 0.25 mg  0.25 mg Oral Q8H PRN Houston Siren, MD   0.25 mg at 11/27/11 2133  . antiseptic oral rinse (BIOTENE) solution 15 mL  15 mL Mouth Rinse BID Ramses L Vega-Casasnovas, MD   15 mL at 11/29/11 0845  . digoxin (LANOXIN) tablet 250 mcg  250 mcg Oral Daily Houston Siren, MD   250 mcg at 11/29/11 0932  . fentaNYL (SUBLIMAZE) injection 25-50 mcg  25-50 mcg Intravenous Q5 min PRN Einar Pheasant, MD      . FLUoxetine (PROZAC) capsule 20 mg  20 mg Oral Daily Houston Siren, MD   20 mg at 11/29/11 0932  . haloperidol (HALDOL) tablet 0.5 mg  0.5 mg Oral BID Houston Siren, MD   0.5 mg at 11/29/11 0933  . heparin injection 5,000 Units  5,000 Units Subcutaneous Q8H Houston Siren, MD   5,000 Units at 11/29/11 [id number]  . HYDROmorphone (DILAUDID) injection 0.5 mg  0.5 mg Intravenous Q2H PRN Emelia Loron, MD    0.5 mg at 11/29/11 1112  . imipenem-cilastatin (PRIMAXIN) 250 mg in sodium chloride 0.9 % 100 mL IVPB  250 mg Intravenous Q6H Maryanna Shape Runyon, PHARMD   250 mg at 11/29/11 1112  . levothyroxine (SYNTHROID, LEVOTHROID) tablet 50 mcg  50 mcg Oral Q breakfast Houston Siren, MD   50 mcg at 11/29/11 9604  . nitroGLYCERIN (NITROSTAT) SL tablet 0.4 mg  0.4 mg Sublingual Q5 Min x 3 PRN Houston Siren, MD      . ondansetron [facility]) injection 4 mg  4 mg Intravenous Q6H PRN Houston Siren, MD   4 mg at 11/28/11 1929  . pantoprazole (PROTONIX) injection 40 mg  40 mg Intravenous QHS Emelia Loron, MD   40 mg at 11/28/11 2249  . potassium chloride 10 mEq in 100 mL IVPB  10 mEq Intravenous Q1 Hr x 3 Penny Pia, MD   10 mEq at 11/28/11 1619  . promethazine (PHENERGAN) injection 6.25-12.5 mg  6.25-12.5 mg Intravenous Q15 min PRN Einar Pheasant, MD      . DISCONTD: aspirin EC tablet 81 mg  81 mg Oral Daily Houston Siren, MD   81 mg at 11/27/11 5409  . DISCONTD: dextrose 5 %-0.9 % sodium chloride infusion   Intravenous Continuous Houston Siren, MD 50 mL/hr  at 11/27/11 1749    . DISCONTD: docusate sodium (COLACE) capsule 100 mg  100 mg Oral BID Houston Siren, MD   100 mg at 11/27/11 2133  . DISCONTD: HYDROmorphone (DILAUDID) injection 0.5 mg  0.5 mg Intravenous Q4H PRN Houston Siren, MD   0.5 mg at 11/27/11 1658  . DISCONTD: lactated ringers infusion   Intravenous Continuous Einar Pheasant, MD 125 mL/hr at 11/29/11 0939 125 mL at 11/29/11 0939  . DISCONTD: lactated ringers infusion   Intravenous Continuous Einar Pheasant, MD 125 mL/hr at 11/28/11 1351 1,000 mL at 11/28/11 1351  . DISCONTD: lactated ringers infusion   Intravenous Continuous Illene Silver, CRNA      . DISCONTD: lactated ringers infusion   Intravenous Continuous Illene Silver, CRNA 125 mL/hr at 11/28/11 1930    . DISCONTD: lactated ringers infusion    PRN Emelia Loron, MD   1,000 mL at 11/28/11 1627  . DISCONTD: ondansetron (ZOFRAN) tablet 4 mg  4 mg Oral Q6H PRN  Houston Siren, MD      . DISCONTD: ondansetron (ZOFRAN-ODT) disintegrating tablet 8 mg  8 mg Oral Q8H PRN Houston Siren, MD      . DISCONTD: pantoprazole (PROTONIX) EC tablet 40 mg  40 mg Oral Q1200 Houston Siren, MD   40 mg at 11/27/11 1119  . DISCONTD: senna (SENOKOT) tablet 8.6 mg  1 tablet Oral BID Houston Siren, MD   8.6 mg at 11/27/11 2133  . DISCONTD: sodium chloride 0.9 % injection 3 mL  3 mL Intravenous Q12H Houston Siren, MD   3 mL at 11/27/11 2134   Facility-Administered Medications Ordered in Other Encounters  Medication Dose Route Frequency Provider Last Rate Last Dose  . DISCONTD: fentaNYL (SUBLIMAZE) injection    PRN Illene Silver, CRNA   50 mcg at 11/28/11 1637  . DISCONTD: glycopyrrolate (ROBINUL) injection    PRN Paris Lore, CRNA   0.5 mg at 11/28/11 1656  . DISCONTD: lactated ringers infusion    Continuous PRN Mechele Dawley, CRNA      . DISCONTD: lactated ringers infusion    Continuous PRN Mechele Dawley, CRNA      . DISCONTD: lidocaine (cardiac) 100 mg/39ml (XYLOCAINE) 20 MG/ML injection 2%    PRN Illene Silver, CRNA   40 mg at 11/28/11 1435  . DISCONTD: midazolam (VERSED) 5 MG/5ML injection    PRN Illene Silver, CRNA   1 mg at 11/28/11 1430  . DISCONTD: neostigmine (PROSTIGMINE) injection   Intravenous PRN Paris Lore, CRNA   3 mg at 11/28/11 1656  . DISCONTD: ondansetron (ZOFRAN) injection    PRN Paris Lore, CRNA   4 mg at 11/28/11 1646  . DISCONTD: propofol (DIPRIVAN) 10 mg/ml infusion    PRN Illene Silver, CRNA   100 mg at 11/28/11 1436  . DISCONTD: rocuronium (ZEMURON) injection    PRN Illene Silver, CRNA   40 mg at 11/28/11 1436    Physical Exam: General appearance: alert and no distress Neck: no adenopathy, no carotid bruit, no JVD, supple, symmetrical, trachea midline and thyroid not enlarged, symmetric, no tenderness/mass/nodules Lungs: clear to auscultation bilaterally Heart: regular rate and rhythm, S1, S2 normal, no murmur, click, rub or gallop Abdomen:  soft, non-tender; bowel sounds normal; no masses,  no organomegaly  Lab Results: Results for orders placed during the hospital encounter of 11/24/11 (from the past 48 hour(s))  COMPREHENSIVE METABOLIC PANEL     Status: Abnormal  Collection Time   11/28/11  5:12 AM      Component Value Range Comment   Sodium 138  135 - 145 (mEq/L)    Potassium 3.1 (*) 3.5 - 5.1 (mEq/L)    Chloride 101  96 - 112 (mEq/L)    CO2 29  19 - 32 (mEq/L)    Glucose, Bld 113 (*) 70 - 99 (mg/dL)    BUN 5 (*) 6 - 23 (mg/dL)    Creatinine, Ser 1.61  0.50 - 1.10 (mg/dL)    Calcium 8.6  8.4 - 10.5 (mg/dL)    Total Protein 6.0  6.0 - 8.3 (g/dL)    Albumin 2.4 (*) 3.5 - 5.2 (g/dL)    AST 8  0 - 37 (U/L)    ALT 8  0 - 35 (U/L)    Alkaline Phosphatase 99  39 - 117 (U/L)    Total Bilirubin 0.4  0.3 - 1.2 (mg/dL)    GFR calc non Af Amer 87 (*) >90 (mL/min)    GFR calc Af Amer >90  >90 (mL/min)   CBC     Status: Abnormal   Collection Time   11/28/11  5:12 AM      Component Value Range Comment   WBC 9.7  4.0 - 10.5 (K/uL)    RBC 3.99  3.87 - 5.11 (MIL/uL)    Hemoglobin 11.9 (*) 12.0 - 15.0 (g/dL)    HCT 09.6 (*) 04.5 - 46.0 (%)    MCV 89.7  78.0 - 100.0 (fL)    MCH 29.8  26.0 - 34.0 (pg)    MCHC 33.2  30.0 - 36.0 (g/dL)    RDW 40.9  81.1 - 91.4 (%)    Platelets 349  150 - 400 (K/uL)   MAGNESIUM     Status: Normal   Collection Time   11/28/11  5:12 AM      Component Value Range Comment   Magnesium 1.9  1.5 - 2.5 (mg/dL)   MRSA PCR SCREENING     Status: Normal   Collection Time   11/28/11  7:34 AM      Component Value Range Comment   MRSA by PCR NEGATIVE  NEGATIVE    COMPREHENSIVE METABOLIC PANEL     Status: Abnormal   Collection Time   11/29/11  3:18 AM      Component Value Range Comment   Sodium 135  135 - 145 (mEq/L)    Potassium 3.7  3.5 - 5.1 (mEq/L)    Chloride 99  96 - 112 (mEq/L)    CO2 29  19 - 32 (mEq/L)    Glucose, Bld 104 (*) 70 - 99 (mg/dL)    BUN 5 (*) 6 - 23 (mg/dL)    Creatinine, Ser 7.82  0.50  - 1.10 (mg/dL)    Calcium 8.3 (*) 8.4 - 10.5 (mg/dL)    Total Protein 5.5 (*) 6.0 - 8.3 (g/dL)    Albumin 2.1 (*) 3.5 - 5.2 (g/dL)    AST 13  0 - 37 (U/L)    ALT 8  0 - 35 (U/L)    Alkaline Phosphatase 92  39 - 117 (U/L)    Total Bilirubin 0.4  0.3 - 1.2 (mg/dL)    GFR calc non Af Amer >90  >90 (mL/min)    GFR calc Af Amer >90  >90 (mL/min)   CBC     Status: Abnormal   Collection Time   11/29/11  3:18 AM      Component  Value Range Comment   WBC 8.7  4.0 - 10.5 (K/uL)    RBC 3.88  3.87 - 5.11 (MIL/uL)    Hemoglobin 11.3 (*) 12.0 - 15.0 (g/dL)    HCT 16.1 (*) 09.6 - 46.0 (%)    MCV 89.7  78.0 - 100.0 (fL)    MCH 29.1  26.0 - 34.0 (pg)    MCHC 32.5  30.0 - 36.0 (g/dL)    RDW 04.5  40.9 - 81.1 (%)    Platelets 386  150 - 400 (K/uL)     Imaging: No results found.  Assessment:  1. Principal Problem: 2.  *Cholecystitis with cholelithiasis s/p percutaneous drain 2/28 3. Active Problems: 4.  Huntington's disease 5.  Atrial fibrillation 6.  Constipation 7.  Hypothyroidism 8.  UTI (lower urinary tract infection) 9.  Hypokalemia 10.   Plan:  1. POD #1 s/p cholecystectomy. No real cardiac history other than a-fib on digoxin. HR is sinus and appears to be a normal rate. No cardiac symptoms. Would recommend post-op EKG (can't find one).  Telemetry reviewed. Will sign-off, call if issues arise. Follow-up with Dr. Clarene Duke in our office.  Time Spent Directly with Patient:  15 minutes  Length of Stay:  LOS: 5 days   Chrystie Nose, MD, [facility] Attending Cardiologist [facility]  Johncharles Fusselman C 11/29/2011, 12:16 PM

## 2011-11-30 DIAGNOSIS — K811 Chronic cholecystitis: Secondary | ICD-10-CM

## 2011-11-30 DIAGNOSIS — E782 Mixed hyperlipidemia: Secondary | ICD-10-CM

## 2011-11-30 DIAGNOSIS — R112 Nausea with vomiting, unspecified: Secondary | ICD-10-CM

## 2011-11-30 DIAGNOSIS — R1084 Generalized abdominal pain: Secondary | ICD-10-CM

## 2011-11-30 DIAGNOSIS — Z9049 Acquired absence of other specified parts of digestive tract: Secondary | ICD-10-CM

## 2011-11-30 MED ORDER — SIMETHICONE 80 MG PO CHEW
80.0000 mg | CHEWABLE_TABLET | Freq: Four times a day (QID) | ORAL | Status: DC | PRN
Start: 2011-11-30 — End: 2011-12-03
  Administered 2011-11-30: 80 mg via ORAL
  Filled 2011-11-30: qty 1

## 2011-11-30 MED ORDER — BISACODYL 10 MG RE SUPP
10.0000 mg | Freq: Once | RECTAL | Status: AC
  Administered 2011-11-30: 10 mg via RECTAL
  Filled 2011-11-30: qty 1

## 2011-11-30 NOTE — Progress Notes (Signed)
2 Days Post-Op  Subjective: Some soreness upper abdomen, no flatus yet  Objective: Vital signs in last 24 hours: Temp:  [97.7 F (36.5 C)-98.9 F (37.2 C)] 98.3 F (36.8 C) (05/05 0459) Pulse Rate:  [79-97] 87  (05/05 0459) Resp:  [12-18] 16  (05/05 0459) BP: (108-152)/(47-81) 151/81 mmHg (05/05 0459) SpO2:  [94 %-98 %] 95 % (05/05 0459) Last BM Date: 11/27/11  Intake/Output from previous day: 05/04 0701 - 05/05 0700 In: 1543.8 [I.V.:1243.8; IV Piggyback:300] Out: 725 [Urine:725] Intake/Output this shift:    General appearance: no distress GI: mild distended, tender at incisions appropriate, wounds clean without infection  Lab Results:   Basename 11/29/11 0318 11/28/11 0512  WBC 8.7 9.7  HGB 11.3* 11.9*  HCT 34.8* 35.8*  PLT 386 349   BMET  Basename 11/29/11 0318 11/28/11 0512  NA 135 138  K 3.7 3.1*  CL 99 101  CO2 29 29  GLUCOSE 104* 113*  BUN 5* 5*  CREATININE 0.52 0.59  CALCIUM 8.3* 8.6   PT/INR No results found for this basename: LABPROT:2,INR:2 in the last 72 hours ABG No results found for this basename: PHART:2,PCO2:2,PO2:2,HCO3:2 in the last 72 hours  Studies/Results: No results found.  Anti-infectives: Anti-infectives     Start     Dose/Rate Route Frequency Ordered Stop   11/28/11 1200   imipenem-cilastatin (PRIMAXIN) 250 mg in sodium chloride 0.9 % 100 mL IVPB        250 mg 200 mL/hr over 30 Minutes Intravenous 4 times per day 11/28/11 1119     11/24/11 2045   ciprofloxacin (CIPRO) IVPB 400 mg  Status:  Discontinued        400 mg 200 mL/hr over 60 Minutes Intravenous Every 12 hours 11/24/11 2044 11/28/11 1105   11/24/11 2045   metroNIDAZOLE (FLAGYL) IVPB 500 mg  Status:  Discontinued        500 mg 100 mL/hr over 60 Minutes Intravenous Every 8 hours 11/24/11 2044 11/25/11 1051   11/24/11 1815   ciprofloxacin (CIPRO) tablet 500 mg        500 mg Oral  Once 11/24/11 1802 11/24/11 1834          Assessment/Plan: POD 2 LOA/ lap  chole 1. I think she has an ileus for variety of reasons and I did loa prior to gb.  Would keep at clears, try to get up as much as possible 2 Hopefully home next 48 hours   LOS: 6 days    [facility] 11/30/2011

## 2011-11-30 NOTE — Progress Notes (Signed)
+   flatus

## 2011-11-30 NOTE — Progress Notes (Signed)
Subjective: Patient has no complaints this AM.  No acute issues overnight.    Objective: Filed Vitals:   11/30/11 0106 11/30/11 0459 11/30/11 0939 11/30/11 1301  BP: 152/77 151/81 132/77 129/70  Pulse: 92 87 104 85  Temp: 98.5 F (36.9 C) 98.3 F (36.8 C) 99.3 F (37.4 C) 97.9 F (36.6 C)  TempSrc: Oral Oral Oral Oral  Resp: 16 16 16 18   Height:      Weight:   55.7 kg (122 lb 12.7 oz)   SpO2: 96% 95% 95% 96%   Weight change:   Intake/Output Summary (Last 24 hours) at 11/30/11 1442 Last data filed at 11/30/11 1400  Gross per 24 hour  Intake 1793.75 ml  Output    965 ml  Net 828.75 ml    General: Alert, awake, in no acute distress.  HEENT: No bruits, no goiter.  Heart: Regular rate and rhythm, without murmurs, rubs, gallops.  Lungs: Clear to auscultation, bilateral air movement.  Abdomen: Soft, nontender, nondistended, positive bowel sounds.  Neuro: Grossly intact, nonfocal.   Lab Results:  [facility] 11/29/11 0318 11/28/11 0512  NA 135 138  K 3.7 3.1*  CL 99 101  CO2 29 29  GLUCOSE 104* 113*  BUN 5* 5*  CREATININE 0.52 0.59  CALCIUM 8.3* 8.6  MG -- 1.9  PHOS -- --    Basename 11/29/11 0318 11/28/11 0512  AST 13 8  ALT 8 8  ALKPHOS 92 99  BILITOT 0.4 0.4  PROT 5.5* 6.0  ALBUMIN 2.1* 2.4*   No results found for this basename: LIPASE:2,AMYLASE:2 in the last 72 hours  Basename 11/29/11 0318 11/28/11 0512  WBC 8.7 9.7  NEUTROABS -- --  HGB 11.3* 11.9*  HCT 34.8* 35.8*  MCV 89.7 89.7  PLT 386 349   No results found for this basename: CKTOTAL:3,CKMB:3,CKMBINDEX:3,TROPONINI:3 in the last 72 hours No components found with this basename: POCBNP:3 No results found for this basename: DDIMER:2 in the last 72 hours No results found for this basename: HGBA1C:2 in the last 72 hours No results found for this basename: CHOL:2,HDL:2,LDLCALC:2,TRIG:2,CHOLHDL:2,LDLDIRECT:2 in the last 72 hours No results found for this basename: TSH,T4TOTAL,FREET3,T3FREE,THYROIDAB  in the last 72 hours No results found for this basename: VITAMINB12:2,FOLATE:2,FERRITIN:2,TIBC:2,IRON:2,RETICCTPCT:2 in the last 72 hours  Micro Results: Recent Results (from the past 240 hour(s))  URINE CULTURE     Status: Normal   Collection Time   11/21/11  1:03 AM      Component Value Range Status Comment   Specimen Description URINE, CATHETERIZED   Final    Special Requests NONE   Final    Culture  Setup Time [id number]   Final    Colony Count NO GROWTH   Final    Culture NO GROWTH   Final    Report Status 11/22/2011 FINAL   Final   URINE CULTURE     Status: Normal   Collection Time   11/24/11  5:03 PM      Component Value Range Status Comment   Specimen Description URINE, CATHETERIZED   Final    Special Requests NONE   Final    Culture  Setup Time [id number]   Final    Colony Count >=100,000 COLONIES/ML   Final    Culture     Final    Value: ESCHERICHIA COLI     Note: CRITICAL RESULT CALLED TO, READ BACK BY AND VERIFIED WITH: TOM R AT 0654 11/28/11 BY SNOLO     KLEBSIELLA PNEUMONIAE  Note: Confirmed Extended Spectrum Beta-Lactamase Producer (ESBL) CRITICAL RESULT CALLED TO, READ BACK BY AND VERIFIED WITH: TOM R AT 0655 11/28/11 BY SNOLO   Report Status 11/28/2011 FINAL   Final    Organism ID, Bacteria ESCHERICHIA COLI   Final    Organism ID, Bacteria KLEBSIELLA PNEUMONIAE   Final   MRSA PCR SCREENING     Status: Normal   Collection Time   11/28/11  7:34 AM      Component Value Range Status Comment   MRSA by PCR NEGATIVE  NEGATIVE  Final     Studies/Results: No results found.  Medications: I have reviewed the patient's current medications.   Patient Active Hospital Problem List: S/P cholecystectomy At this juncture Surgery managing.  Will follow up with their recommendations.  Atrial fibrillation (09/23/2011) Currently on digoxin and well controlled.   UTI (lower urinary tract infection) (11/27/2011) Today completes three days total of antibiotics.  At this point  will plan on discontinuing once patient is ready for discharge hopefully within the next 48 hours as indicated by surgery.  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  Hypothyroidism (11/24/2011) Will plan on continuing synthroid at this juncture.  Disposition:  Pending recovery post surgery.  Will await surgery's recommendation.   Thank you for taking care of our patient.     LOS: 6 days   Penny Pia M.D.  [facility] 11/30/2011, 2:42 PM

## 2011-11-30 NOTE — Progress Notes (Signed)
Pt abdomen distended paged dr Cena Benton he called and advised encourage PO fluids and ambulation. Pt is not able to ambulate. Contacted Dr Abbey Chatters regarding belly distention and he advised try simethicone 80mg  q6 prn to help with gas. Annitta Needs, RN

## 2011-12-01 DIAGNOSIS — R112 Nausea with vomiting, unspecified: Secondary | ICD-10-CM

## 2011-12-01 DIAGNOSIS — K811 Chronic cholecystitis: Secondary | ICD-10-CM

## 2011-12-01 DIAGNOSIS — E782 Mixed hyperlipidemia: Secondary | ICD-10-CM

## 2011-12-01 DIAGNOSIS — R1084 Generalized abdominal pain: Secondary | ICD-10-CM

## 2011-12-01 LAB — COMPREHENSIVE METABOLIC PANEL
ALT: 23 U/L (ref 0–35)
Alkaline Phosphatase: 54 U/L (ref 39–117)
BUN: 17 mg/dL (ref 6–23)
CO2: 28 mEq/L (ref 19–32)
Calcium: 9 mg/dL (ref 8.4–10.5)
GFR calc Af Amer: 70 mL/min — ABNORMAL LOW (ref >90)
GFR calc non Af Amer: 60 mL/min — ABNORMAL LOW (ref >90)
Glucose, Bld: 106 mg/dL — ABNORMAL HIGH (ref 70–99)
Potassium: 3.8 mEq/L (ref 3.5–5.1)
Sodium: 139 mEq/L (ref 135–145)
Total Protein: 6.6 g/dL (ref 6.0–8.3)

## 2011-12-01 NOTE — Progress Notes (Signed)
[facility] note: MSW met with Patient(pt) and Caregiver-[name]. This is a non-related admission. Pt is a DNR. Pt was groggy on visit. Pt appeared comfortable. Pt was able to say hello to MSW and wave. Fannie Knee was pleased with pt's surgery and progress pt is making. Fannie Knee plans to take pt home at hospital discharge. No DME needs at this time. MSW offered support and reassurance of continued hospice care. Please call with questions or concerns.   Elijio Miles, MSW

## 2011-12-01 NOTE — Care Management Note (Signed)
    Page 1 of 2   12/03/2011     2:33:00 PM   CARE MANAGEMENT NOTE 12/03/2011  Patient:  Sydney [name], Sydney [name]   Account Number:  [id number]  Date Initiated:  11/25/2011  Documentation initiated by:  Raiford Noble  Subjective/Objective Assessment:   pt adm with Pyelonephritis  Huntington's disease  Cholecystitis with cholelithiasis s/p percutaneous drain 2/28     Action/Plan:   active with [facility]. lives with Eugenio Hoes, caregiver-- dcp to return home w/ hospice services   Anticipated DC Date:  12/03/2011   Anticipated DC Plan:  HOME W HOSPICE CARE  In-house referral  Clinical Social Worker      DC Planning Services  CM consult      [facility] Choice  Resumption Of Svcs/PTA Provider   Choice offered to / List presented to:  NA   DME arranged  NA      DME agency  NA     HH arranged  NA      HH agency  NA   Status of service:  Completed, signed off Medicare Important Message given?   (If response is "NO", the following Medicare IM given date fields will be blank) Date Medicare IM given:   Date Additional Medicare IM given:    Discharge Disposition:  HOME W HOSPICE CARE  Per UR Regulation:  Reviewed for med. necessity/level of care/duration of stay  If discussed at Long Length of Stay Meetings, dates discussed:    Comments:  12-03-11 Lorenda Ishihara RN CM [phone] Notified Rose with [facility] of patient's d/c home today per Dr. Rito Ehrlich. Notified CSW of d/c and need for transportation.  12-01-11 Lorenda Ishihara RN CM [phone] Spoke with patient at bedside, confirmed the above and below information. Plan for d/c home with [facility] in next 1-2 days. [facility] following for d/c needs. Notified CSW of need for transportation at d/c.  11-25-11 Raiford Noble, RN,BSN,CM [phone] Spoke with patient and caregiver, Fannie Knee at bedside. Active with [facility] and has all the dme at home she needs per caregiver. Confirmed with Rose with [facility] that pt is indeed active with them. [name]'s contact numbers are: cell  [phone] and home [phone]. Will need transportation home upon dc.

## 2011-12-01 NOTE — Evaluation (Signed)
Physical Therapy Evaluation Patient Details Name: Sydney [name] MRN: [id number] DOB: September 29, 1934 Today's Date: 12/01/2011 Time: 4098-1191 PT Time Calculation (min): 16 min  PT Assessment / Plan / Recommendation Clinical Impression  Pt s/p lap chole with hx of Huntingtons.  Pt currently has caregiver to assist at home and hospice care.  Will assist pt while in hospital with mobility and assess/educate caregiver on transfers for safe d/c home.    PT Assessment  Patient needs continued PT services    Follow Up Recommendations  No PT follow up    Equipment Recommendations  None recommended by PT    Frequency Min 3X/week    Precautions / Restrictions Precautions Precautions: Fall   Pertinent Vitals/Pain       Mobility  Bed Mobility Bed Mobility: Supine to Sit Supine to Sit: 2: Max assist Details for Bed Mobility Assistance: pt required assist of positioning LEs and trunk in bed then sitting upright (states yes to pain in abdomen upon sitting) Transfers Transfers: Sit to Stand;Stand to Sit Sit to Stand: 1: +2 Total assist;From bed Sit to Stand: Patient Percentage: 50% Stand to Sit: 1: +2 Total assist;To chair/3-in-1 Stand to Sit: Patient Percentage: 50% Details for Transfer Assistance: pt with 2 HHA to stand, assist for weakness and steadying Ambulation/Gait Ambulation/Gait Assistance: 1: +2 Total assist Ambulation/Gait: Patient Percentage: 50 Ambulation Distance (Feet): 8 Feet Assistive device: 2 person hand held assist Ambulation/Gait Assistance Details: caregiver reports pt no longer uses walker and uses only transfers to w/c, chorea with ambulation Gait Pattern: Step-through pattern;Decreased stride length    Exercises     PT Goals Acute Rehab PT Goals PT Goal Formulation: With patient Time For Goal Achievement: 12/08/11 Potential to Achieve Goals: Good Pt will go Supine/Side to Sit: with mod assist PT Goal: Supine/Side to Sit - Progress: Goal set today Pt will  go Sit to Supine/Side: with mod assist PT Goal: Sit to Supine/Side - Progress: Goal set today Pt will go Sit to Stand: with min assist PT Goal: Sit to Stand - Progress: Goal set today Pt will go Stand to Sit: with min assist PT Goal: Stand to Sit - Progress: Goal set today Pt will Transfer Bed to Chair/Chair to Bed: with min assist PT Transfer Goal: Bed to Chair/Chair to Bed - Progress: Goal set today Pt will Ambulate: with mod assist;1 - 15 feet;Other (comment) (2 HHA)  Visit Information  Last PT Received On: 12/01/11 Assistance Needed: +2    Subjective Data  Subjective: "Ok."   Prior Functioning  Home Living Available Help at Discharge: Personal care attendant Home Adaptive Equipment: Wheelchair - manual Additional Comments: Caregiver present on eval and states she assist pt with all ADLs and mobility.  pt also receiving hospice at home and plans per chart to d/c home. Prior Function Level of Independence: Needs assistance Needs Assistance: Bathing;Dressing;Feeding;Grooming;Toileting;Transfers Transfer Assistance: moderate assist per caregiver for bed mobility and transfer to w/c    Cognition  Overall Cognitive Status: History of cognitive impairments - at baseline Arousal/Alertness: Awake/alert Behavior During Session: [facility] for tasks performed Cognition - Other Comments: hx of Huntingtons    Extremity/Trunk Assessment Right Upper Extremity Assessment RUE ROM/Strength/Tone: Unable to fully assess;Deficits RUE ROM/Strength/Tone Deficits: chorea of all extremities upon mobility, assist for mobility Left Upper Extremity Assessment LUE ROM/Strength/Tone: Deficits;Unable to fully assess LUE ROM/Strength/Tone Deficits: chorea of all extremities upon mobility, assist for mobility Right Lower Extremity Assessment RLE ROM/Strength/Tone: Deficits;Unable to fully assess RLE ROM/Strength/Tone Deficits: chorea of all extremities  upon mobility, assist for mobility Left Lower Extremity  Assessment LLE ROM/Strength/Tone: Deficits;Unable to fully assess LLE ROM/Strength/Tone Deficits: chorea of all extremities upon mobility, assist for mobility   Balance    End of Session PT - End of Session Equipment Utilized During Treatment: Gait belt Activity Tolerance: Patient tolerated treatment well Patient left: in chair;with call bell/phone within reach;with family/visitor present   Sydney [name],KATHrine E 12/01/2011, 3:47 PM Pager: [phone]

## 2011-12-01 NOTE — Progress Notes (Signed)
3 Days Post-Op  Subjective: Wakes up easily, and is cooperative, Clear liquids at bedside. She does not speak  Objective: Vital signs in last 24 hours: Temp:  [97.9 F (36.6 C)-99.3 F (37.4 C)] 98.5 F (36.9 C) (05/06 0555) Pulse Rate:  [84-104] 86  (05/06 0555) Resp:  [16-22] 20  (05/06 0555) BP: (123-136)/(51-77) 123/51 mmHg (05/06 0555) SpO2:  [95 %-96 %] 95 % (05/06 0555) FiO2 (%):  [2 %] 2 % (05/06 0555) Weight:  [55.7 kg (122 lb 12.7 oz)-62.687 kg (138 lb 3.2 oz)] 62.687 kg (138 lb 3.2 oz) (05/06 0555) Last BM Date: 11/27/11  TM 99.3, VSS, Clears, labs OK,   Intake/Output from previous day: 05/05 0701 - 05/06 0700 In: 2937.5 [P.O.:600; I.V.:1837.5; IV Piggyback:500] Out: 951 [Urine:950; Stool:1] Intake/Output this shift:    General appearance: alert, cooperative, no distress and does not speak Resp: clear to auscultation bilaterally GI: soft, non-tender; bowel sounds normal; no masses,  no organomegaly and Incisions look good, she does not appear to be to tender.  Lab Results:   [facility] 11/29/11 0318  WBC 8.7  HGB 11.3*  HCT 34.8*  PLT 386    BMET  Basename 12/01/11 0400 11/29/11 0318  NA 139 135  K 3.8 3.7  CL 103 99  CO2 28 29  GLUCOSE 106* 104*  BUN 17 5*  CREATININE 0.91 0.52  CALCIUM 9.0 8.3*   PT/INR No results found for this basename: LABPROT:2,INR:2 in the last 72 hours   Lab 12/01/11 0400 11/29/11 0318 11/28/11 0512 11/26/11 0515 11/24/11 1649  AST 22 13 8 10 12   ALT 23 8 8 13 17   ALKPHOS 54 92 99 112 115  BILITOT 0.4 0.4 0.4 0.5 0.8  PROT 6.6 5.5* 6.0 5.8* 6.6  ALBUMIN 2.4* 2.1* 2.4* 2.4* 2.9*     Lipase     Component Value Date/Time   LIPASE 9* 11/24/2011 1649     Studies/Results: No results found.  Medications:    . antiseptic oral rinse  15 mL Mouth Rinse BID  . bisacodyl  10 mg Rectal Once  . digoxin  250 mcg Oral Daily  . FLUoxetine  20 mg Oral Daily  . haloperidol  0.5 mg Oral BID  . heparin  5,000 Units  Subcutaneous Q8H  . imipenem-cilastatin  250 mg Intravenous Q6H  . levothyroxine  50 mcg Oral Q breakfast  . pantoprazole (PROTONIX) IV  40 mg Intravenous QHS    Assessment/Plan Hx.  symptomatic cholelithiasis, percutaneous drain, followed by Lap. Cholecystectomy, lysis of adhesions 11/28/11 Dr. Dwain Sarna Day 8 antibiotics Heparin - DVT Sydney [name]'s disease Afib UTI  Hx. Constipation Hypothyroid  Plan:  She can't tell me how she's doing, but has a recorded BM so I think we can go to full liquids.  Continue to mobilize.         LOS: 7 days    Sydney [name] 12/01/2011

## 2011-12-01 NOTE — Progress Notes (Signed)
Subjective: At this point patient feeling better.  No new complaints this am.  Objective: Filed Vitals:   11/30/11 1648 11/30/11 2050 12/01/11 0256 12/01/11 0555  BP: 127/71 136/65  123/51  Pulse: 84 94  86  Temp: 99.3 F (37.4 C) 98.7 F (37.1 C)  98.5 F (36.9 C)  TempSrc: Oral Oral  Oral  Resp: 20 22  20   Height:      Weight:   60.1 kg (132 lb 7.9 oz) 62.687 kg (138 lb 3.2 oz)  SpO2: 96% 95%  95%   Weight change:   Intake/Output Summary (Last 24 hours) at 12/01/11 1259 Last data filed at 12/01/11 1610  Gross per 24 hour  Intake 2577.5 ml  Output    651 ml  Net 1926.5 ml    General: Alert, awake, in no acute distress.  HEENT: No bruits, no goiter.  Heart: Regular rate and rhythm, without murmurs, rubs, gallops.  Lungs: Clear to auscultation, bilateral air movement.  Abdomen: Soft, incision intact no cellulitis, no suprapubic tenderness, nondistended, positive bowel sounds.  Neuro: Grossly intact, nonfocal.   Lab Results:  Basename 12/01/11 0400 11/29/11 0318  NA 139 135  K 3.8 3.7  CL 103 99  CO2 28 29  GLUCOSE 106* 104*  BUN 17 5*  CREATININE 0.91 0.52  CALCIUM 9.0 8.3*  MG -- --  PHOS -- --    Basename 12/01/11 0400 11/29/11 0318  AST 22 13  ALT 23 8  ALKPHOS 54 92  BILITOT 0.4 0.4  PROT 6.6 5.5*  ALBUMIN 2.4* 2.1*   No results found for this basename: LIPASE:2,AMYLASE:2 in the last 72 hours  Basename 11/29/11 0318  WBC 8.7  NEUTROABS --  HGB 11.3*  HCT 34.8*  MCV 89.7  PLT 386   No results found for this basename: CKTOTAL:3,CKMB:3,CKMBINDEX:3,TROPONINI:3 in the last 72 hours No components found with this basename: POCBNP:3 No results found for this basename: DDIMER:2 in the last 72 hours No results found for this basename: HGBA1C:2 in the last 72 hours No results found for this basename: CHOL:2,HDL:2,LDLCALC:2,TRIG:2,CHOLHDL:2,LDLDIRECT:2 in the last 72 hours No results found for this basename: TSH,T4TOTAL,FREET3,T3FREE,THYROIDAB in the  last 72 hours No results found for this basename: VITAMINB12:2,FOLATE:2,FERRITIN:2,TIBC:2,IRON:2,RETICCTPCT:2 in the last 72 hours  Micro Results: Recent Results (from the past 240 hour(s))  URINE CULTURE     Status: Normal   Collection Time   11/24/11  5:03 PM      Component Value Range Status Comment   Specimen Description URINE, CATHETERIZED   Final    Special Requests NONE   Final    Culture  Setup Time [id number]   Final    Colony Count >=100,000 COLONIES/ML   Final    Culture     Final    Value: ESCHERICHIA COLI     Note: CRITICAL RESULT CALLED TO, READ BACK BY AND VERIFIED WITH: TOM R AT 0654 11/28/11 BY SNOLO     KLEBSIELLA PNEUMONIAE     Note: Confirmed Extended Spectrum Beta-Lactamase Producer (ESBL) CRITICAL RESULT CALLED TO, READ BACK BY AND VERIFIED WITH: TOM R AT 0655 11/28/11 BY SNOLO   Report Status 11/28/2011 FINAL   Final    Organism ID, Bacteria ESCHERICHIA COLI   Final    Organism ID, Bacteria KLEBSIELLA PNEUMONIAE   Final   MRSA PCR SCREENING     Status: Normal   Collection Time   11/28/11  7:34 AM      Component Value Range Status Comment  MRSA by PCR NEGATIVE  NEGATIVE  Final     Studies/Results: No results found.  Medications: I have reviewed the patient's current medications.  Patient Active Hospital Problem List: S/P cholecystectomy At this juncture Surgery managing. Will follow up with their recommendations.   Atrial fibrillation (09/23/2011) Currently on digoxin and well controlled.   UTI (lower urinary tract infection) (11/27/2011) Today completes three days total of antibiotics. At this point will plan on discontinuing once patient is ready for discharge hopefully within the next 48 hours as indicated by surgery.   Huntington's disease (09/23/2011) Stable, continue home regimen.  Constipation (11/24/2011)  Patient is s/p cholecystectomy and thus I will plan on holding medications for constipation. Will recommend increase in po fluid intake and  ambulation.  Hypothyroidism (11/24/2011) Will plan on continuing synthroid at this juncture.   Disposition: Pending recovery post surgery. Will follow up with surgery's recommendations.     LOS: 7 days   Penny Pia M.D.  [facility] 12/01/2011, 12:59 PM

## 2011-12-01 NOTE — Progress Notes (Signed)
Room 1522  -  Aeriel Boulay  [facility]  [facility] RN Visit  NON-Related admission to [facility] diagnosis of Hunting's Disease.  Pt is DNR code.    Pt sound asleep, did not respond to verbal stimuli, lying in bed, appeared comfortable.  No family present.  Discussed with staff RN, pt has just started with flatus, no BM,  on clear liquid diet only, surgical site good.  Please call [facility] @ [phone] with any needs.  Thank you.  Joneen Boers, RN  [facility]  Hospice Liaison

## 2011-12-01 NOTE — Progress Notes (Signed)
ANTIBIOTIC CONSULT NOTE - Follow-Up  Pharmacy Consult for Primaxin Indication: UTI  Allergies  Allergen Reactions  . Codeine Itching  . Penicillins Cross Reactors Hives    Patient Measurements: Height: 5\' 2"  (157.5 cm) Weight: 138 lb 3.2 oz (62.687 kg) IBW/kg (Calculated) : 50.1   Vital Signs: Temp: 98.5 F (36.9 C) (05/06 0555) Temp src: Oral (05/06 0555) BP: 123/51 mmHg (05/06 0555) Pulse Rate: 86  (05/06 0555) Intake/Output from previous day: 05/05 0701 - 05/06 0700 In: 2937.5 [P.O.:600; I.V.:1837.5; IV Piggyback:500] Out: 951 [Urine:950; Stool:1] Intake/Output from this shift:    Labs:  Basename 12/01/11 0400 11/29/11 0318  WBC -- 8.7  HGB -- 11.3*  PLT -- 386  LABCREA -- --  CREATININE 0.91 0.52   Estimated Creatinine Clearance: 46.5 ml/min (by C-G formula based on Cr of 0.91). Normalized Creatinine Clearance: ~ 61 ml/min/72kg  Microbiology: 4/29 urine culture: E.coli (MDR, sensitive to gentamicin and imipenem only), Klebsiella pneumoniae (MDR, sensitive to imipenem only)  Assessment: 77 YOF with multi-drug resistant E.Coli and Klebsiella pneumoniae UTI, now on D#4 Primaxin 250mg  IV q6h.  SCr slightly increased but still wnl.  Current Primaxin dosage remains appropriate.  Goal of Therapy:  doses adjusted per renal clearance  Plan:  Continue Primaxin 250 mg IV q6h.  Elie Goody, PharmD, BCPS Pager: [phone] 12/01/2011  10:05 AM

## 2011-12-01 NOTE — Progress Notes (Signed)
I have seen and examined the patient and agree with the assessment and plans.  Shyne Lehrke A. Orlo Brickle  MD, FACS  

## 2011-12-02 ENCOUNTER — Encounter (HOSPITAL_COMMUNITY): Payer: Self-pay | Admitting: General Surgery

## 2011-12-02 DIAGNOSIS — K811 Chronic cholecystitis: Secondary | ICD-10-CM

## 2011-12-02 DIAGNOSIS — E782 Mixed hyperlipidemia: Secondary | ICD-10-CM

## 2011-12-02 DIAGNOSIS — R112 Nausea with vomiting, unspecified: Secondary | ICD-10-CM

## 2011-12-02 DIAGNOSIS — R1084 Generalized abdominal pain: Secondary | ICD-10-CM

## 2011-12-02 MED ORDER — ENSURE COMPLETE PO LIQD
237.0000 mL | Freq: Two times a day (BID) | ORAL | Status: DC
  Administered 2011-12-02 – 2011-12-03 (×3): 237 mL via ORAL

## 2011-12-02 MED ORDER — BISACODYL 10 MG RE SUPP
10.0000 mg | Freq: Once | RECTAL | Status: AC
  Administered 2011-12-02: 10 mg via RECTAL
  Filled 2011-12-02: qty 1

## 2011-12-02 NOTE — Progress Notes (Signed)
INITIAL ADULT NUTRITION ASSESSMENT Date: 12/02/2011   Time: 12:16 PM Reason for Assessment: Poor intake  ASSESSMENT: Female 77 y.o.  Dx: Status post laparoscopic cholecystectomy  Food/Nutrition Related Hx: Brief note completed by RD on 11/25/11. Pt followed by hospice and was eating what she wanted at home. Pt had cholecystectomy on 5/3. Pt's intake recently for the past few days has been <50% of meals. Nursing reports pt started out well this morning with dysphagia 3 breakfast, however stopped after only a few bites r/t feeling full. Pt drank all liquids on tray and asked for Ensure during visit. Pt had BM today which visitors said helped her feel better.   Hx:  Past Medical History  Diagnosis Date  . Huntington disease   . Atrial fibrillation   . Chronic respiratory failure     nocturnal oxygen  . Complication of anesthesia     felt surgery with epidural  . GERD (gastroesophageal reflux disease)   . Thyroid disease    Related Meds:  Scheduled Meds:   . antiseptic oral rinse  15 mL Mouth Rinse BID  . bisacodyl  10 mg Rectal Once  . digoxin  250 mcg Oral Daily  . FLUoxetine  20 mg Oral Daily  . haloperidol  0.5 mg Oral BID  . heparin  5,000 Units Subcutaneous Q8H  . imipenem-cilastatin  250 mg Intravenous Q6H  . levothyroxine  50 mcg Oral Q breakfast  . pantoprazole (PROTONIX) IV  40 mg Intravenous QHS   Continuous Infusions:   . sodium chloride 75 mL/hr at 12/02/11 0546   PRN Meds:.acetaminophen, acetaminophen, ALPRAZolam, HYDROmorphone, nitroGLYCERIN, ondansetron (ZOFRAN) IV, simethicone  Ht: 5\' 2"  (157.5 cm)  Wt: 138 lb 3.2 oz (62.687 kg)  Ideal Wt: 110 lb % Ideal Wt: 125  Usual Wt: 115 lb on 11/07/11 % Usual Wt: 120  Body mass index is 25.28 kg/(m^2).   Labs:  CMP     Component Value Date/Time   NA 139 12/01/2011 0400   K 3.8 12/01/2011 0400   CL 103 12/01/2011 0400   CO2 28 12/01/2011 0400   GLUCOSE 106* 12/01/2011 0400   BUN 17 12/01/2011 0400   CREATININE 0.91  12/01/2011 0400   CALCIUM 9.0 12/01/2011 0400   PROT 6.6 12/01/2011 0400   ALBUMIN 2.4* 12/01/2011 0400   AST 22 12/01/2011 0400   ALT 23 12/01/2011 0400   ALKPHOS 54 12/01/2011 0400   BILITOT 0.4 12/01/2011 0400   GFRNONAA 60* 12/01/2011 0400   GFRAA 70* 12/01/2011 0400    Intake/Output Summary (Last 24 hours) at 12/02/11 1224 Last data filed at 12/02/11 1014  Gross per 24 hour  Intake   2720 ml  Output   1026 ml  Net   1694 ml   Last BM - 12/02/11  Diet Order: Dysphagia 3 thin  IVF:    sodium chloride Last Rate: 75 mL/hr at 12/02/11 0546    Estimated Nutritional Needs:   Kcal: 1550-1850 Protein: 75-90g Fluid: 1.5-1.8L  NUTRITION DIAGNOSIS: -Inadequate oral intake (NI-2.1).  Status: Ongoing  RELATED TO: early satiety  AS EVIDENCE BY: nursing statement, <50% meal intake  MONITORING/EVALUATION(Goals): Pt to consume >75% of meals/supplements.   EDUCATION NEEDS: -No education needs identified at this time  INTERVENTION: Hopefully now that pt had a BM she won't feel as full during lunch. Ensure Complete BID. Will monitor.   Dietitian #: [phone]  DOCUMENTATION CODES Per approved criteria  -Not Applicable    Marshall Cork 12/02/2011, 12:16 PM

## 2011-12-02 NOTE — Progress Notes (Signed)
Interim history:Sydney [name] is an 77 y.o. female with history of "late onset" Huntington's disease, atrial fibrillation, chronic respiratory insufficiency on home O2 when necessary, hypothyroidism, status post biliary drainage tube placement in February 2013, frequent abdominal pains requiring frequent ER visits, presents once more with abdominal pain nausea, vomiting, unable to tolerate by mouth.  Surgery and family decided after much consideration to proceed with cholecystectomy.  Patient tolerated procedure well and is currently awaiting to meet post surgical goals prior to being discharged.  Subjective:  No acute issues overnight.  Patient states that she feels better.    Objective: Filed Vitals:   12/01/11 1400 12/01/11 2157 12/02/11 0615 12/02/11 1404  BP: 131/49 141/60 138/75 145/80  Pulse: 78 75 83 90  Temp: 97.6 F (36.4 C) 98.1 F (36.7 C) 97.8 F (36.6 C) 99 F (37.2 C)  TempSrc: Oral Oral Oral Oral  Resp: 18 20 20 16   Height:      Weight:      SpO2: 96% 96% 96% 97%   Weight change:   Intake/Output Summary (Last 24 hours) at 12/02/11 1939 Last data filed at 12/02/11 1800  Gross per 24 hour  Intake   2640 ml  Output    926 ml  Net   1714 ml   General: Alert, awake, in no acute distress.  HEENT: No bruits, no goiter.  Heart: Regular rate and rhythm, without murmurs, rubs, gallops.  Lungs: Clear to auscultation, bilateral air movement.  Abdomen: Soft, incision intact no cellulitis, no suprapubic tenderness, nondistended, positive bowel sounds.  Neuro: Grossly intact, nonfocal.   Lab Results:  Basename 12/01/11 0400  NA 139  K 3.8  CL 103  CO2 28  GLUCOSE 106*  BUN 17  CREATININE 0.91  CALCIUM 9.0  MG --  PHOS --    Basename 12/01/11 0400  AST 22  ALT 23  ALKPHOS 54  BILITOT 0.4  PROT 6.6  ALBUMIN 2.4*   No results found for this basename: LIPASE:2,AMYLASE:2 in the last 72 hours No results found for this basename:  WBC:2,NEUTROABS:2,HGB:2,HCT:2,MCV:2,PLT:2 in the last 72 hours No results found for this basename: CKTOTAL:3,CKMB:3,CKMBINDEX:3,TROPONINI:3 in the last 72 hours No components found with this basename: POCBNP:3 No results found for this basename: DDIMER:2 in the last 72 hours No results found for this basename: HGBA1C:2 in the last 72 hours No results found for this basename: CHOL:2,HDL:2,LDLCALC:2,TRIG:2,CHOLHDL:2,LDLDIRECT:2 in the last 72 hours No results found for this basename: TSH,T4TOTAL,FREET3,T3FREE,THYROIDAB in the last 72 hours No results found for this basename: VITAMINB12:2,FOLATE:2,FERRITIN:2,TIBC:2,IRON:2,RETICCTPCT:2 in the last 72 hours  Micro Results: Recent Results (from the past 240 hour(s))  URINE CULTURE     Status: Normal   Collection Time   11/24/11  5:03 PM      Component Value Range Status Comment   Specimen Description URINE, CATHETERIZED   Final    Special Requests NONE   Final    Culture  Setup Time [id number]   Final    Colony Count >=100,000 COLONIES/ML   Final    Culture     Final    Value: ESCHERICHIA COLI     Note: CRITICAL RESULT CALLED TO, READ BACK BY AND VERIFIED WITH: TOM R AT 0654 11/28/11 BY SNOLO     KLEBSIELLA PNEUMONIAE     Note: Confirmed Extended Spectrum Beta-Lactamase Producer (ESBL) CRITICAL RESULT CALLED TO, READ BACK BY AND VERIFIED WITH: TOM R AT 0655 11/28/11 BY SNOLO   Report Status 11/28/2011 FINAL   Final  Organism ID, Bacteria ESCHERICHIA COLI   Final    Organism ID, Bacteria KLEBSIELLA PNEUMONIAE   Final   MRSA PCR SCREENING     Status: Normal   Collection Time   11/28/11  7:34 AM      Component Value Range Status Comment   MRSA by PCR NEGATIVE  NEGATIVE  Final     Studies/Results: No results found.  Medications: I have reviewed the patient's current medications.  Patient Active Hospital Problem List: S/P cholecystectomy At this juncture Surgery managing. Will follow up with their recommendations.   Atrial fibrillation  (09/23/2011) Currently on digoxin and well controlled.   UTI (lower urinary tract infection) (11/27/2011) Today completes three days total of antibiotics. Have discontinue antibiotics as patient has received appropriate coverage and is asymptomatic currently.  Huntington's disease (09/23/2011) Stable, continue home regimen.   Constipation (11/24/2011)  Patient is s/p cholecystectomy and thus I will plan on holding medications for constipation. Will recommend increase in po fluid intake and ambulation.   Hypothyroidism (11/24/2011) Will plan on continuing synthroid at this juncture.  Disposition: Pending recovery post surgery. Will follow up with surgery's recommendations.      LOS: 8 days   Penny Pia M.D.  [facility] 12/02/2011, 7:39 PM

## 2011-12-02 NOTE — Progress Notes (Signed)
4 Days Post-Op  Subjective: She is awake and alert, talking this AM, says she's at [facility] and remembered it was for her gallbladder.  Objective: Vital signs in last 24 hours: Temp:  [97.6 F (36.4 C)-98.1 F (36.7 C)] 97.8 F (36.6 C) (05/07 0615) Pulse Rate:  [75-83] 83  (05/07 0615) Resp:  [18-20] 20  (05/07 0615) BP: (131-141)/(49-75) 138/75 mmHg (05/07 0615) SpO2:  [96 %] 96 % (05/07 0615) Last BM Date: 11/27/11  Afebrile, VSS, No labs, No recorded BM, On dysphagia diet  Intake/Output from previous day: 05/06 0701 - 05/07 0700 In: 2940 [P.O.:840; I.V.:1700; IV Piggyback:400] Out: 750 [Urine:750] Intake/Output this shift: Total I/O In: 240 [P.O.:240] Out: -   General appearance: alert, cooperative and no distress Resp: clear to auscultation bilaterally GI: soft, but distended.  I can't tell if she's passing flatus or not.  she isn't really tender on palpation of abdomen.  Lab Results:  No results found for this basename: WBC:2,HGB:2,HCT:2,PLT:2 in the last 72 hours  BMET  Basename 12/01/11 0400  NA 139  K 3.8  CL 103  CO2 28  GLUCOSE 106*  BUN 17  CREATININE 0.91  CALCIUM 9.0   PT/INR No results found for this basename: LABPROT:2,INR:2 in the last 72 hours   Lab 12/01/11 0400 11/29/11 0318 11/28/11 0512 11/26/11 0515  AST 22 13 8 10   ALT 23 8 8 13   ALKPHOS 54 92 99 112  BILITOT 0.4 0.4 0.4 0.5  PROT 6.6 5.5* 6.0 5.8*  ALBUMIN 2.4* 2.1* 2.4* 2.4*     Lipase     Component Value Date/Time   LIPASE 9* 11/24/2011 1649     Studies/Results: No results found.  Medications:    . antiseptic oral rinse  15 mL Mouth Rinse BID  . digoxin  250 mcg Oral Daily  . FLUoxetine  20 mg Oral Daily  . haloperidol  0.5 mg Oral BID  . heparin  5,000 Units Subcutaneous Q8H  . imipenem-cilastatin  250 mg Intravenous Q6H  . levothyroxine  50 mcg Oral Q breakfast  . pantoprazole (PROTONIX) IV  40 mg Intravenous QHS    Assessment/Plan Hx. symptomatic  cholelithiasis, percutaneous drain, followed by Lap. Cholecystectomy, lysis of adhesions 11/28/11 Dr. Dwain Sarna  Day 9 antibiotics  Heparin - DVT  Huntington's disease  Afib  UTI  Hx. Constipation  Hypothyroid   Plan:  Recheck cbc in AM.  Dulcolax supp. Today. Will ask about length of RX with antibiotics.     LOS: 8 days    Sydney [name] 12/02/2011

## 2011-12-02 NOTE — Progress Notes (Signed)
I have seen and examined the patient and agree with the assessment and plans.  Ayansh Feutz A. Shraga Custard  MD, FACS  

## 2011-12-02 NOTE — Plan of Care (Signed)
Problem: Phase I Progression Outcomes Goal: OOB as tolerated unless otherwise ordered Pt unable to turn self; calls out randomly and sometimes purposely- (had small bm and wanted to be changed)

## 2011-12-03 DIAGNOSIS — E782 Mixed hyperlipidemia: Secondary | ICD-10-CM

## 2011-12-03 DIAGNOSIS — R1084 Generalized abdominal pain: Secondary | ICD-10-CM

## 2011-12-03 DIAGNOSIS — K811 Chronic cholecystitis: Secondary | ICD-10-CM

## 2011-12-03 DIAGNOSIS — R112 Nausea with vomiting, unspecified: Secondary | ICD-10-CM

## 2011-12-03 LAB — URINALYSIS, ROUTINE W REFLEX MICROSCOPIC
Leukocytes, UA: NEGATIVE
Nitrite: NEGATIVE
Protein, ur: NEGATIVE mg/dL
Specific Gravity, Urine: 1.014 (ref 1.005–1.030)
Urobilinogen, UA: 1 mg/dL (ref 0.0–1.0)

## 2011-12-03 LAB — CBC
HCT: 32.2 % — ABNORMAL LOW (ref 36.0–46.0)
MCHC: 33.9 g/dL (ref 30.0–36.0)
RDW: 13.8 % (ref 11.5–15.5)
WBC: 9.8 10*3/uL (ref 4.0–10.5)

## 2011-12-03 LAB — URINE MICROSCOPIC-ADD ON

## 2011-12-03 MED ORDER — SODIUM CHLORIDE 0.9 % IV SOLN
250.0000 mg | Freq: Four times a day (QID) | INTRAVENOUS | Status: DC
  Administered 2011-12-03 (×2): 250 mg via INTRAVENOUS
  Filled 2011-12-03 (×3): qty 250

## 2011-12-03 NOTE — Progress Notes (Signed)
I have seen and examined the patient and agree with the assessment and plans.  Family really concerned about pt's urine so will check UA  Shannah Conteh A. Magnus Ivan  MD, FACS

## 2011-12-03 NOTE — Progress Notes (Signed)
Room 1522 - Sydney [name]   [facility]  [facility] RN Visit  NON-Related admission to [facility] diagnosis of Huntington's.   Pt is DNR code.    Pt arousable, lying in bed, minimum conversation and went back to sleep during visit.  No complaints of pain or discomfort.    PCG-[name] present.   Fannie Knee called this RN to get information on ESBL - she questioned why pt was on contact precaution prior to surgery and was not following surgery.  Pt has now been replaced on contact precaution for the ESBL.    No discharge planning on chart.    Please call [facility] @ [phone] with any needs.  Thank you.  Joneen Boers, RN  [facility]  Hospice Liaison

## 2011-12-03 NOTE — Progress Notes (Signed)
5 Days Post-Op  Subjective: Doing well from surgery, no complaints wakes up and knows [facility] and Gall bladder. Family worried about urine.  Objective: Vital signs in last 24 hours: Temp:  [98.2 F (36.8 C)-99 F (37.2 C)] 98.6 F (37 C) (05/08 1610) Pulse Rate:  [72-90] 72  (05/08 0652) Resp:  [16-18] 18  (05/08 0652) BP: (135-150)/(74-80) 135/80 mmHg (05/08 0652) SpO2:  [95 %-97 %] 95 % (05/08 0652) Weight:  [61.2 kg (134 lb 14.7 oz)] 61.2 kg (134 lb 14.7 oz) (05/08 0230) Last BM Date: 12/02/11  Dysphagia diet, afebrile, VSS, CBC is normal  Intake/Output from previous day: 05/07 0701 - 05/08 0700 In: 1430 [P.O.:480; I.V.:750; IV Piggyback:200] Out: 926 [Urine:925; Stool:1] Intake/Output this shift: Total I/O In: 160 [P.O.:160] Out: -   GI: soft, non-tender; bowel sounds normal; no masses,  no organomegaly and +BM, wounds ok no distension or tenderness.  Lab Results:   [facility] 12/03/11 0428  WBC 9.8  HGB 10.9*  HCT 32.2*  PLT 496*    BMET  Basename 12/01/11 0400  NA 139  K 3.8  CL 103  CO2 28  GLUCOSE 106*  BUN 17  CREATININE 0.91  CALCIUM 9.0   PT/INR No results found for this basename: LABPROT:2,INR:2 in the last 72 hours   Lab 12/01/11 0400 11/29/11 0318 11/28/11 0512  AST 22 13 8   ALT 23 8 8   ALKPHOS 54 92 99  BILITOT 0.4 0.4 0.4  PROT 6.6 5.5* 6.0  ALBUMIN 2.4* 2.1* 2.4*     Lipase     Component Value Date/Time   LIPASE 9* 11/24/2011 1649     Studies/Results: No results found.  Medications:    . antiseptic oral rinse  15 mL Mouth Rinse BID  . bisacodyl  10 mg Rectal Once  . digoxin  250 mcg Oral Daily  . feeding supplement  237 mL Oral BID BM  . FLUoxetine  20 mg Oral Daily  . haloperidol  0.5 mg Oral BID  . heparin  5,000 Units Subcutaneous Q8H  . levothyroxine  50 mcg Oral Q breakfast  . pantoprazole (PROTONIX) IV  40 mg Intravenous QHS  . DISCONTD: imipenem-cilastatin  250 mg Intravenous Q6H    Assessment/Plan Hx.  symptomatic cholelithiasis, percutaneous drain, followed by Lap. Cholecystectomy, lysis of adhesions 11/28/11 Dr. Dwain Sarna  Day 9 antibiotics  Heparin - DVT  Huntington's disease  Afib  UTI  Hx. Constipation  Hypothyroid   Plan:  I will get UA for daughter,and we will be available as needed.  I will put follow up in AVS.     LOS: 9 days    Sidney Kann 12/03/2011

## 2011-12-03 NOTE — Progress Notes (Addendum)
ANTIBIOTIC CONSULT NOTE - Follow-Up  Pharmacy Consult for Primaxin Indication: UTI  Allergies  Allergen Reactions  . Codeine Itching  . Penicillins Cross Reactors Hives    Patient Measurements: Height: 5\' 2"  (157.5 cm) Weight: 134 lb 14.7 oz (61.2 kg) IBW/kg (Calculated) : 50.1   Vital Signs: Temp: 98.6 F (37 C) (05/08 5621) Temp src: Oral (05/08 0652) BP: 135/80 mmHg (05/08 0652) Pulse Rate: 72  (05/08 0652) Intake/Output from previous day: 05/07 0701 - 05/08 0700 In: 1430 [P.O.:480; I.V.:750; IV Piggyback:200] Out: 926 [Urine:925; Stool:1] Intake/Output from this shift: Total I/O In: 160 [P.O.:160] Out: -   Labs:  Basename 12/03/11 0428 12/01/11 0400  WBC 9.8 --  HGB 10.9* --  PLT 496* --  LABCREA -- --  CREATININE -- 0.91   Estimated Creatinine Clearance: 46 ml/min (by C-G formula based on Cr of 0.91). Normalized Creatinine Clearance: ~ 61 ml/min/72kg  Microbiology: 4/29 urine culture: E.coli (MDR, sensitive to gentamicin and imipenem only), Klebsiella pneumoniae (MDR, sensitive to imipenem only)  Assessment:  77 YOF with multi-drug resistant E.Coli and Klebsiella pneumoniae UTI.  Primaxin started 5/3, then stopped 5/7. Ordered to resume today.  Received Cipro 4/29-5/3.   SCr slightly increased but still wnl on 5/6.  Current Primaxin dosage remains appropriate.  Goal of Therapy:  doses adjusted per renal clearance  Plan:  Continue Primaxin 250 mg IV q6h. Follow up duration of therapy  Loralee Pacas, PharmD, BCPS Pager: [phone] 12/03/2011  10:11 AM

## 2011-12-03 NOTE — Discharge Summary (Signed)
DISCHARGE SUMMARY  Sydney [name]  MR#: [id number]  DOB:January 06, 1935  Date of Admission: 11/24/2011 Date of Discharge: 12/03/2011  Attending Physician:Kiyo Heal K  Patient's NWG:[id number],VHQ Sydney Fus, MD, MD  Consults:Treatment Team:  Bishop Limbo, MD Kandis Cocking, MD [facility]  Discharge Diagnoses: Present on Admission:  .Pyelonephritis .Huntington's disease .Cholecystitis with cholelithiasis s/p percutaneous drain 2/28 .Atrial fibrillation .Constipation .Hypothyroidism .UTI (lower urinary tract infection) .Hypokalemia   Initial presentation: Patient is a 77 year old white female with past medical history of Huntington's disease who presented with abdominal pain and found to have acute cholecystitis along with a urinary tract infection.  Hospital Course: Patient Active Problem List  Diagnoses     . Huntington's disease: Patient will continue on home hospice as this condition is terminal.   . Atrial fibrillation: Well-controlled. Stable heart rate. Currently on digoxin. Not an active issue during his hospitalization.   . Vertigo: Stable during his hospitalization. No active issues.   . Constipation: Patient was restarted on medications for constipation when she was able to take by mouth.   . Hypothyroidism: Continue on Synthroid. Stable during this hospitalization.   Marland Kitchen UTI (lower urinary tract infection): Patient's urine grew up positive for greater than 100,000 colonies of Escherichia coli as well as Klebsiella pneumoniae which was also found to be EBSL. Patient received 5 doses of IV Primaxin. I spoke with infectious disease and they confirmed that with 5 days, this would be adequately treated. A repeat urinalysis on day of discharge was normal.   . Status post laparoscopic cholecystectomy: Patient had a previous history of cholecystitis and underwent a biliary drain in February of 2013. Since that time she's had frequent ER visits and able to take  little by mouth. Evaluation emergency room noted an elevated white blood cell count and was felt her symptoms may be more likely from her urinary tract infection, but after discussion with the family they favored her getting a cholecystectomy. This was discussed with general surgery and because the patient hour to been followed closely, the plan was for patient to go to the OR and undergo cholecystectomy. Patient underwent the procedure on 11/28/11 without any complications. Procedure was kept as a laparoscopic procedure. Over the next several days patient's diet was advanced and by day of discharge she was tolerating a dysphagia 3 diet. She's had some episodes of nausea which are felt more likely be related to her Primaxin.      Medication List  As of 12/03/2011  2:26 PM   TAKE these medications         ALPRAZolam 0.25 MG tablet   Commonly known as: XANAX   Take 0.25 mg by mouth every 8 (eight) hours as needed. For anxiety      aspirin EC 81 MG tablet   Take 81 mg by mouth daily.      clonazePAM 0.5 MG tablet   Commonly known as: KLONOPIN   Take 1 mg by mouth at bedtime.      digoxin 0.25 MG tablet   Commonly known as: LANOXIN   Take 250 mcg by mouth daily.      estrogens (conjugated) 0.625 MG tablet   Commonly known as: PREMARIN   Take 0.625 mg by mouth See admin instructions. Taken daily for first 25 days of each month.      FLUoxetine 20 MG capsule   Commonly known as: PROZAC   Take 20 mg by mouth daily.      haloperidol 0.5 MG tablet  Commonly known as: HALDOL   Take 0.5 mg by mouth 2 (two) times daily.      ibuprofen 800 MG tablet   Commonly known as: ADVIL,MOTRIN   Take 800 mg by mouth every 8 (eight) hours as needed. For pain      levothyroxine 50 MCG tablet   Commonly known as: SYNTHROID, LEVOTHROID   Take 50 mcg by mouth daily.      meclizine 25 MG tablet   Commonly known as: ANTIVERT   Take 25 mg by mouth 3 (three) times daily as needed. For dizziness/nausea.       nitroGLYCERIN 0.4 MG SL tablet   Commonly known as: NITROSTAT   Place 0.4 mg under the tongue every 5 (five) minutes x 3 doses as needed.      omeprazole 20 MG capsule   Commonly known as: PRILOSEC   Take 20 mg by mouth daily.      ondansetron 8 MG disintegrating tablet   Commonly known as: ZOFRAN-ODT   Take 1 tablet (8 mg total) by mouth every 8 (eight) hours as needed for nausea. For nausea.      polyethylene glycol packet   Commonly known as: MIRALAX / GLYCOLAX   Take 17 g by mouth daily as needed. For constipation.      promethazine 25 MG suppository   Commonly known as: PHENERGAN   Place 25 mg rectally every 8 (eight) hours as needed. For nausea.      senna-docusate 8.6-50 MG per tablet   Commonly known as: Senokot-S   Take 2 tablets by mouth 2 (two) times daily.         ASK your doctor about these medications         HYDROcodone-acetaminophen 5-500 MG per tablet   Commonly known as: VICODIN   Take 1-2 tablets by mouth every 6 (six) hours as needed for pain.             Day of Discharge BP 135/80  Pulse 84  Temp(Src) 98.6 F (37 C) (Oral)  Resp 18  Ht 5\' 2"  (1.575 m)  Wt 61.2 kg (134 lb 14.7 oz)  BMI 24.68 kg/m2  SpO2 95%  Physical Exam: General: Alert and oriented x2, no acute distress HEENT: Normocephalic, atraumatic, mucous members are dry Cardiovascular: Regular rate and rhythm, S1-S2-currently normal sinus rhythm Lungs: Clear to auscultation bilaterally Abdomen: Soft, nontender, nondistended, hypoactive bowel sounds Extremities: No clubbing or cyanosis or edema.  Results for orders placed during the hospital encounter of 11/24/11 (from the past 24 hour(s))  CBC     Status: Abnormal   Collection Time   12/03/11  4:28 AM      Component Value Range   WBC 9.8  4.0 - 10.5 (K/uL)   RBC 3.62 (*) 3.87 - 5.11 (MIL/uL)   Hemoglobin 10.9 (*) 12.0 - 15.0 (g/dL)   HCT 16.1 (*) 09.6 - 46.0 (%)   MCV 89.0  78.0 - 100.0 (fL)   MCH 30.1  26.0 - 34.0 (pg)    MCHC 33.9  30.0 - 36.0 (g/dL)   RDW 04.5  40.9 - 81.1 (%)   Platelets 496 (*) 150 - 400 (K/uL)  URINALYSIS, ROUTINE W REFLEX MICROSCOPIC     Status: Abnormal   Collection Time   12/03/11 11:42 AM      Component Value Range   Color, Urine YELLOW  YELLOW    APPearance CLEAR  CLEAR    Specific Gravity, Urine 1.014  1.005 - 1.030  pH 7.0  5.0 - 8.0    Glucose, UA NEGATIVE  NEGATIVE (mg/dL)   Hgb urine dipstick SMALL (*) NEGATIVE    Bilirubin Urine NEGATIVE  NEGATIVE    Ketones, ur NEGATIVE  NEGATIVE (mg/dL)   Protein, ur NEGATIVE  NEGATIVE (mg/dL)   Urobilinogen, UA 1.0  0.0 - 1.0 (mg/dL)   Nitrite NEGATIVE  NEGATIVE    Leukocytes, UA NEGATIVE  NEGATIVE   URINE MICROSCOPIC-ADD ON     Status: Abnormal   Collection Time   12/03/11 11:42 AM      Component Value Range   Squamous Epithelial / LPF FEW (*) RARE    RBC / HPF 3-6  <3 (RBC/hpf)   Bacteria, UA FEW (*) RARE    Urine-Other MUCOUS PRESENT      Disposition: Improved, being discharged home with continued home hospice. Her long-term prognosis is limited because of her Huntington's disease   Follow-up Appts: Discharge Orders    Future Orders Please Complete By Expires   DIET DYS 3      Up with assistance         Follow-up Information    Follow up with [facility] (active with hospice services at home)    Contact information:   [address] [place], [place] [phone]      Follow up with [facility], MD. Schedule an appointment as soon as possible for a visit in 2 weeks.   Contact information:   [facility] [address] [place] [address] [phone]       Follow up with Ginette Otto, MD. (As needed)    Contact information:   [address] [place] [address] [place] [address] [phone]          Tests Needing Follow-up: None  Time spent in discharge (includes decision making & examination of  pt): 40 minutes  Signed: Hollice Espy 12/03/2011, 2:26 PM

## 2011-12-03 NOTE — Discharge Instructions (Signed)
[facility] ______[facility] LAPAROSCOPIC SURGERY: POST OP INSTRUCTIONS Always review your discharge instruction sheet given to you by the facility where your surgery was performed. IF YOU HAVE DISABILITY OR FAMILY LEAVE FORMS, YOU MUST BRING THEM TO THE OFFICE FOR PROCESSING.   DO NOT GIVE THEM TO YOUR DOCTOR.  1. A prescription for pain medication may be given to you upon discharge.  Take your pain medication as prescribed, if needed.  If narcotic pain medicine is not needed, then you may take acetaminophen (Tylenol) or ibuprofen (Advil) as needed. 2. Take your usually prescribed medications unless otherwise directed. 3. If you need a refill on your pain medication, please contact your pharmacy.  They will contact our office to request authorization. Prescriptions will not be filled after 5pm or on week-ends. 4. You should follow a light diet the first few days after arrival home, such as soup and crackers, etc.  Be sure to include lots of fluids daily. 5. Most patients will experience some swelling and bruising in the area of the incisions.  Ice packs will help.  Swelling and bruising can take several days to resolve.  6. It is common to experience some constipation if taking pain medication after surgery.  Increasing fluid intake and taking a stool softener (such as Colace) will usually help or prevent this problem from occurring.  A mild laxative (Milk of Magnesia or Miralax) should be taken according to package instructions if there are no bowel movements after 48 hours. 7. Unless discharge instructions indicate otherwise, you may remove your bandages 24-48 hours after surgery, and you may shower at that time.  You may have steri-strips (small skin tapes) in place directly over the incision.  These strips should be left on the skin for 7-10 days.  If your surgeon used skin glue on the incision, you may shower in 24 hours.  The glue will flake off over the next 2-3 weeks.  Any sutures or  staples will be removed at the office during your follow-up visit. 8. ACTIVITIES:  You may resume regular (light) daily activities beginning the next day--such as daily self-care, walking, climbing stairs--gradually increasing activities as tolerated.  You may have sexual intercourse when it is comfortable.  Refrain from any heavy lifting or straining until approved by your doctor. a. You may drive when you are no longer taking prescription pain medication, you can comfortably wear a seatbelt, and you can safely maneuver your car and apply brakes. b. RETURN TO WORK:  __________________________________________________________ 9. You should see your doctor in the office for a follow-up appointment approximately 2-3 weeks after your surgery.  Make sure that you call for this appointment within a day or two after you arrive home to insure a convenient appointment time. 10. OTHER INSTRUCTIONS: __________________________________________________________________________________________________________________________ __________________________________________________________________________________________________________________________ WHEN TO CALL YOUR DOCTOR: 1. Fever over 101.0 2. Inability to urinate 3. Continued bleeding from incision. 4. Increased pain, redness, or drainage from the incision. 5. Increasing abdominal pain  The clinic staff is available to answer your questions during regular business hours.  Please don't hesitate to call and ask to speak to one of the nurses for clinical concerns.  If you have a medical emergency, go to the nearest emergency room or call 911.  A surgeon from [facility] is always on call at the hospital. [address], [address], [place], [place]  [address] ? [address] [address], [place], [place]   [address] [phone] ? [phone] ? FAX [phone] Web site:   [email or web address] Laparoscopic Cholecystectomy Laparoscopic cholecystectomy is  surgery to remove the gallbladder. The gallbladder is located slightly to the right of center in the abdomen, behind the liver. It is a concentrating and storage sac for the bile produced in the liver. Bile aids in the digestion and absorption of fats. Gallbladder disease (cholecystitis) is an inflammation of your gallbladder. This condition is usually caused by a buildup of gallstones (cholelithiasis) in your gallbladder. Gallstones can block the flow of bile, resulting in inflammation and pain. In severe cases, emergency surgery may be required. When emergency surgery is not required, you will have time to prepare for the procedure. Laparoscopic surgery is an alternative to open surgery. Laparoscopic surgery usually has a shorter recovery time. Your common bile duct may also need to be examined and explored. Your caregiver will discuss this with you if he or she feels this should be done. If stones are found in the common bile duct, they may be removed. LET YOUR CAREGIVER KNOW ABOUT:  Allergies to food or medicine.   Medicines taken, including vitamins, herbs, eyedrops, over-the-counter medicines, and creams.   Use of steroids (by mouth or creams).   Previous problems with anesthetics or numbing medicines.   History of bleeding problems or blood clots.   Previous surgery.   Other health problems, including diabetes and kidney problems.   Possibility of pregnancy, if this applies.  RISKS AND COMPLICATIONS All surgery is associated with risks. Some problems that may occur following this procedure include:  Infection.   Damage to the common bile duct, nerves, arteries, veins, or other internal organs such as the stomach or intestines.   Bleeding.   A stone may remain in the common bile duct.  BEFORE THE PROCEDURE  Do not take aspirin for 3 days prior to surgery or blood thinners for 1 week prior to surgery.   Do not eat or drink anything after midnight the night before surgery.    Let your caregiver know if you develop a cold or other infectious problem prior to surgery.   You should be present 60 minutes before the procedure or as directed.  PROCEDURE  You will be given medicine that makes you sleep (general anesthetic). When you are asleep, your surgeon will make several small cuts (incisions) in your abdomen. One of these incisions is used to insert a small, lighted scope (laparoscope) into the abdomen. The laparoscope helps the surgeon see into your abdomen. Carbon dioxide gas will be pumped into your abdomen. The gas allows more room for the surgeon to perform your surgery. Other operating instruments are inserted through the other incisions. Laparoscopic procedures may not be appropriate when:  There is major scarring from previous surgery.   The gallbladder is extremely inflamed.   There are bleeding disorders or unexpected cirrhosis of the liver.   A pregnancy is near term.   Other conditions make the laparoscopic procedure impossible.  If your surgeon feels it is not safe to continue with a laparoscopic procedure, he or she will perform an open abdominal procedure. In this case, the surgeon will make an incision to open the abdomen. This gives the surgeon a larger view and field to work within. This may allow the surgeon to perform procedures that sometimes cannot be performed with a laparoscope alone. Open surgery has a longer recovery time. AFTER THE PROCEDURE  You will be taken to the recovery area where a nurse will watch and check your progress.   You may be allowed to   go home the same day.   Do not resume physical activities until directed by your caregiver.   You may resume a normal diet and activities as directed.  Document Released: 07/14/2005 Document Revised: 07/03/2011 Document Reviewed: 12/27/2010 [employer] Patient Information 2012 [employer]. 

## 2011-12-03 NOTE — Progress Notes (Signed)
PT Cancellation and Discharge Note  Treatment cancelled today due to pt discharged from hospital. .  Shalondra Wunschel 12/03/2011, 5:50 PM Denney Shein L. Marybella Ethier DPT [phone]

## 2011-12-15 ENCOUNTER — Encounter (INDEPENDENT_AMBULATORY_CARE_PROVIDER_SITE_OTHER): Payer: Self-pay | Admitting: General Surgery

## 2011-12-15 ENCOUNTER — Ambulatory Visit (INDEPENDENT_AMBULATORY_CARE_PROVIDER_SITE_OTHER): Payer: Medicare Other | Admitting: General Surgery

## 2011-12-15 VITALS — BP 128/88 | HR 70 | Temp 96.9°F | Resp 14 | Ht 66.0 in | Wt 115.1 lb

## 2011-12-15 DIAGNOSIS — Z09 Encounter for follow-up examination after completed treatment for conditions other than malignant neoplasm: Secondary | ICD-10-CM

## 2011-12-15 NOTE — Progress Notes (Signed)
Subjective:     Patient ID: Sydney [name], female   DOB: 1935/03/03, 77 y.o.   MRN: [id number]  HPI This is a 77 year old female with Huntington's disease who had a percutaneous cholecystostomy put in by one of my partners. She was admitted to the hospital and I did a laparoscopic cholecystectomy with a significant lysis of adhesions. She did well and was discharged home. She returns today without any complaints. She is eating well and having bowel movements and her pain is nonexistent.  Review of Systems     Objective:   Physical Exam Well healing incisions without infection, abdomen soft nontender    Assessment:     S/p lap chole     Plan:     She is doing well and I told her she could return to normal activity Will see her back in six weeks just to make sure she continues to do well

## 2011-12-23 ENCOUNTER — Encounter (INDEPENDENT_AMBULATORY_CARE_PROVIDER_SITE_OTHER): Payer: Medicare Other | Admitting: General Surgery

## 2012-01-28 ENCOUNTER — Encounter (INDEPENDENT_AMBULATORY_CARE_PROVIDER_SITE_OTHER): Payer: Self-pay | Admitting: General Surgery

## 2012-01-28 ENCOUNTER — Ambulatory Visit (INDEPENDENT_AMBULATORY_CARE_PROVIDER_SITE_OTHER): Payer: Medicare Other | Admitting: General Surgery

## 2012-01-28 VITALS — BP 132/74 | HR 74 | Resp 16 | Ht 66.0 in | Wt 116.0 lb

## 2012-01-28 DIAGNOSIS — Z09 Encounter for follow-up examination after completed treatment for conditions other than malignant neoplasm: Secondary | ICD-10-CM

## 2012-01-28 NOTE — Progress Notes (Signed)
Subjective:     Patient ID: Sydney [name], female   DOB: 1935/06/06, 77 y.o.   MRN: [id number]  HPI  64 yof with Huntingtons disease who underwent lap chole and significant loa.  She is doing well without complaints. Her upper teeth are causing problems and she is trying to find someone to take care of them Review of Systems     Objective:   Physical Exam Abdomen soft, nontender, wounds all healed    Assessment:     S/p lap chole    Plan:     She is released to full activity and diet Will ask Dr. Kristin Bruins if he would be willing to see her.

## 2012-02-09 ENCOUNTER — Encounter (HOSPITAL_COMMUNITY): Payer: Self-pay | Admitting: Dentistry

## 2012-02-09 ENCOUNTER — Ambulatory Visit (HOSPITAL_COMMUNITY): Payer: Medicare Other | Admitting: Dentistry

## 2012-02-09 VITALS — BP 106/67 | HR 73 | Temp 97.0°F

## 2012-02-09 DIAGNOSIS — K083 Retained dental root: Secondary | ICD-10-CM

## 2012-02-09 DIAGNOSIS — G10 Huntington's disease: Secondary | ICD-10-CM

## 2012-02-09 DIAGNOSIS — K08109 Complete loss of teeth, unspecified cause, unspecified class: Secondary | ICD-10-CM

## 2012-02-09 DIAGNOSIS — K036 Deposits [accretions] on teeth: Secondary | ICD-10-CM

## 2012-02-09 DIAGNOSIS — M2607 Excessive tuberosity of jaw: Secondary | ICD-10-CM

## 2012-02-09 DIAGNOSIS — K0530 Chronic periodontitis, unspecified: Secondary | ICD-10-CM

## 2012-02-09 DIAGNOSIS — K029 Dental caries, unspecified: Secondary | ICD-10-CM

## 2012-02-09 DIAGNOSIS — M264 Malocclusion, unspecified: Secondary | ICD-10-CM

## 2012-02-09 DIAGNOSIS — M270 Developmental disorders of jaws: Secondary | ICD-10-CM

## 2012-02-09 NOTE — Progress Notes (Signed)
DENTAL CONSULTATION  Date of Consultation:  02/09/2012 Patient Name:   Sydney [name] Date of Birth:   July 19, 1935 Medical Record Number: [id number]  VITALS: BP 106/67  Pulse 73  Temp 97 F (36.1 C)   CHIEF COMPLAINT: Patient referred for a dental consultation by surgeon Dr. Emelia Loron.  HPI: Sydney [name] is a 77 year old female referred by Dr. Emelia Loron for a Dental consultation.  Patient has a medical history significant for Huntington's disease with recent laparoscopic cholecystectomy by Dr. Dwain Sarna on 11/28/2011. Patient was seen by Dr. Dwain Sarna on a followup examiantion with a history of possible dental problems noted. Patient then referred to dental medicine for evaluation and discussion of possible treatment options.  Patient initially gives a history of dental tooth pain and indicates that " all my teeth hurt".  Later during the consultation, patient denies having any acute dental pain or problems. Patient initially wanted " all my teeth out"...and then later indicated that she did not want all her teeth out.  Power of attorney, Eugenio Hoes, could not provide much input on whether the patient was having pain or not. The Power of attorney, however, requested evaluation for possible treatment options for the patient.  Patient has a history of having previous dental evaluation with Dr. Milinda Pointer (Oral surgeon) at the [facility] in June of 2012. At that time, it was determined that the patient would not undergo extraction of remaining teeth due to potential for complications. Patient was then to have followed up with Dr. Theodoro Kalata in May of 2013. Patient, however, was unable to make this appointment due to hospitalization for her cholecystitis and cholelithiasis and recent cholecystectomy.  Prior to that, the patient had seen Dr. Wendall Mola (oral surgeon ) in [place] who [facility]. Patient last  saw her primary Dentist, Dr. Lendon Ka, for treatment for an exam and cleaning about 5 years ago.  Patient previously had an upper complete denture but is no longer able to wear the denture.   PMH: Past Medical History  Diagnosis Date  . Huntington disease   . Atrial fibrillation   . Chronic respiratory failure     nocturnal oxygen  . Complication of anesthesia     felt surgery with epidural  . GERD (gastroesophageal reflux disease)   . Thyroid disease     PSH: Past Surgical History  Procedure Date  . Colon surgery   . Abdominal hysterectomy   . Hemorrhoid surgery   . Tonsillectomy   . Appendectomy   . Cholecystectomy 11/28/2011    Procedure: LAPAROSCOPIC CHOLECYSTECTOMY;  Surgeon: Emelia Loron, MD;  Location: [facility];  Service: General;  Laterality: N/A;    ALLERGIES: Allergies  Allergen Reactions  . Codeine Itching  . Penicillins Cross Reactors Hives    MEDICATIONS: Current Outpatient Prescriptions  Medication Sig Dispense Refill  . ALPRAZolam (XANAX) 0.25 MG tablet Take 0.25 mg by mouth every 8 (eight) hours as needed. For anxiety       . aspirin EC 81 MG tablet Take 81 mg by mouth daily.        . clonazePAM (KLONOPIN) 0.5 MG tablet Take 1 mg by mouth at bedtime.       . digoxin (LANOXIN) 0.25 MG tablet Take 250 mcg by mouth daily.        Marland Kitchen estrogens, conjugated, (PREMARIN) 0.625 MG tablet Take 0.625 mg by mouth See admin instructions. Taken daily for first 25  days of each month.      Marland Kitchen FLUoxetine (PROZAC) 20 MG capsule Take 20 mg by mouth daily.        . haloperidol (HALDOL) 0.5 MG tablet Take 0.5 mg by mouth 2 (two) times daily.        Marland Kitchen ibuprofen (ADVIL,MOTRIN) 800 MG tablet Take 800 mg by mouth every 8 (eight) hours as needed. For pain       . levothyroxine (SYNTHROID, LEVOTHROID) 50 MCG tablet Take 50 mcg by mouth daily.        . meclizine (ANTIVERT) 25 MG tablet Take 25 mg by mouth 3 (three) times daily as needed. For dizziness/nausea.      Marland Kitchen omeprazole  (PRILOSEC) 20 MG capsule Take 20 mg by mouth daily.      . polyethylene glycol (MIRALAX / GLYCOLAX) packet Take 17 g by mouth daily as needed. For constipation.      . senna-docusate (SENOKOT-S) 8.6-50 MG per tablet Take 2 tablets by mouth 2 (two) times daily.      . nitroGLYCERIN (NITROSTAT) 0.4 MG SL tablet Place 0.4 mg under the tongue every 5 (five) minutes x 3 doses as needed.      . ondansetron (ZOFRAN-ODT) 8 MG disintegrating tablet Take 1 tablet (8 mg total) by mouth every 8 (eight) hours as needed for nausea. For nausea.  20 tablet  1  . promethazine (PHENERGAN) 25 MG suppository Place 25 mg rectally every 8 (eight) hours as needed. For nausea.        LABS: Lab Results  Component Value Date   WBC 9.8 12/03/2011   HGB 10.9* 12/03/2011   HCT 32.2* 12/03/2011   MCV 89.0 12/03/2011   PLT 496* 12/03/2011      Component Value Date/Time   NA 139 12/01/2011 0400   K 3.8 12/01/2011 0400   CL 103 12/01/2011 0400   CO2 28 12/01/2011 0400   GLUCOSE 106* 12/01/2011 0400   BUN 17 12/01/2011 0400   CREATININE 0.91 12/01/2011 0400   CALCIUM 9.0 12/01/2011 0400   GFRNONAA 60* 12/01/2011 0400   GFRAA 70* 12/01/2011 0400   Lab Results  Component Value Date   INR 0.97 09/24/2011   No results found for this basename: PTT    SOCIAL HISTORY: History   Social History  . Marital Status: Single    Spouse Name: N/A    Number of Children: N/A  . Years of Education: N/A   Occupational History  . Not on file.   Social History Main Topics  . Smoking status: Former Smoker    Quit date: 09/22/1998  . Smokeless tobacco: Never Used  . Alcohol Use: No  . Drug Use: No  . Sexually Active: Not on file   Other Topics Concern  . Not on file   Social History Narrative  . No narrative on file    FAMILY HISTORY: Family History  Problem Relation Age of Onset  . Huntington's disease    . Diabetes type II    . Coronary artery disease    . Heart disease Mother   . Stroke Father   . Heart disease Brother       REVIEW OF SYSTEMS: Reviewed with patient and power of attorney and positive as above.  DENTAL HISTORY: CHIEF COMPLAINT: Patient referred for a dental consultation by surgeon Dr. Emelia Loron.  HPI: Sydney [name] is a 77 year old female referred by Dr. Emelia Loron for a Dental consultation.  Patient has a medical  history significant for Huntington's disease with recent laparoscopic cholecystectomy by Dr. Dwain Sarna on 11/28/2011. Patient was seen by Dr. Dwain Sarna on a followup examiantion with a history of possible dental problems noted. Patient then referred to dental medicine for evaluation and discussion of possible treatment options.  Patient initially gives a history of dental tooth pain and indicates that " all my teeth hurt".  Later during the consultation, patient denies having any acute dental pain or problems. Patient initially wanted " all my teeth out"...and then later indicated that she did not want all her teeth out.  Power of attorney, Eugenio Hoes, could not provide much input on whether the patient was having pain or not. The Power of attorney, however, requested evaluation for possible treatment options for the patient.  Patient has a history of having previous dental evaluation with Dr. Milinda Pointer (Oral surgeon) at the [facility] in June of 2012. At that time, it was determined that the patient would not undergo extraction of remaining teeth due to potential for complications. Patient was then to have followed up with Dr. Theodoro Kalata in May of 2013. Patient, however, was unable to make this appointment due to hospitalization for her cholecystitis and cholelithiasis and recent cholecystectomy.  Prior to that, the patient had seen Dr. Wendall Mola (oral surgeon ) in [place] who had [facility] to [facility]. Patient last saw her primary Dentist, Dr. Lendon Ka, for treatment for an exam and cleaning about 5 years ago.   Patient previously had an upper complete denture but is no longer able to wear the denture.  DENTAL EXAMINATION:  GENERAL: Patient is a well-developed, slightly built female in no acute distress. HEAD AND NECK: There is no palpable lymphadenopathy. The patient denies acute TMJ symptoms. INTRAORAL EXAM: Patient has normal saliva. I do not see any evidence of abscess formation. Patient has a mid palatal torus near the vibrating line. Patient has a small mandibular right lingual torus. Patient has bilateral maxillary excessive tuberosities. DENTITION: Patient is missing all teeth with the exception of tooth numbers 22, 23, 24, 25, 26,27, 28, 29, 31.  Tooth numbers 25 is present as a retained root segment. PERIODONTAL: Patient with chronic periodontitis with plaque and calculus accumulations, gingival recession, and incipient tooth mobility. DENTAL CARIES/SUBOPTIMAL RESTORATIONS: There are multiple dental caries noted as per dental charting form. ENDODONTIC: She currently with a history of nonspecific tooth pain. Patient is unable to point out any specific tooth that is hurting. We were unable to obtain dental radiographs today. CROWN AND BRIDGE: Patient with multiple crown and bridge restorations that are less than ideal due to secondary caries. PROSTHODONTIC: Patient with a history of an upper complete denture that she is no longer able to wear. Due to the Huntington's disease, the patient most likely will not be able to wear complete dentures. OCCLUSION: She with a poor occlusal scheme.  RADIOGRAPHIC INTERPRETATION: An orthopantogram and and periapical radiographs were unable to be obtained today due to lack of patient's ability to help in the process. Patient had excessive body and head movements today.   ASSESSMENTS: 1. Chronic periodontitis with bone loss 2. Accretions 3. Gingival recession 4. Incipient tooth mobility 5. Palatal torus 6. Mandibular right lingual torus 7. Dental  caries 8. Suboptimal dental restorations due to recurrent caries 9 . Multiple missing teeth 10. Retained root segment #25 11. History of upper complete denture that the patient no longer wears 12. Bilateral excessive maxillary tuberosities 13. Questionable history of  acute pulpitis symptoms with no obvious intraoral swelling, fever, or submandibular lymphadenopathy. 14. Potential for medical complications with anticipated use of general anesthesia for the dental procedures in the operating room.   PLAN/RECOMMENDATIONS: 1. I discussed the risks, benefits, and complications of various treatment options with the patient and Power of Attorney in relationship to the patient's medical and dental conditions. We discussed various treatment options to include no treatment, multiple extractions with alveoloplasty, pre-prosthetic surgery as indicated, periodontal therapy, dental restorations, root canal therapy, crown and bridge therapy, implant therapy, and replacement of missing teeth as indicated. We also discussed referral to [facility] or possibly [facility] for the dental procedures the patient desires. The patient and power of attorney specifically understand that patient will not be able to wear complete dentures if teeth are extracted. The patient and power of attorney currently wish to think about the options at this time. Power of attorney is leaning towards not having any dental treatment at this time but will discuss with the patient when she is home to determine exactly what the patient desires.  Meantime I have obtained release of information to obtain the dental records and radiographs from the [facility] Dentistry as indicated.    2. Discussion of findings with medical team and coordination of future medical and dental care as indicated.    Charlynne Pander, DDS

## 2012-03-19 ENCOUNTER — Telehealth (HOSPITAL_COMMUNITY): Payer: Self-pay | Admitting: Dentistry

## 2013-03-06 IMAGING — RF DG CHOLANGIOGRAM ADD FILMS
11 of 15 series · 15 of 24 positions shown · non-contrast
Comparison: CT 09/23/2011

CLINICAL DATA: Preop cholangiogram

CATHETER CHOLANGIOGRAM
Fluoroscopy Time: 0.54 minutes

[Series 1: run · 1 of 1 slices shown (1 of 11)]
[im 1/1]
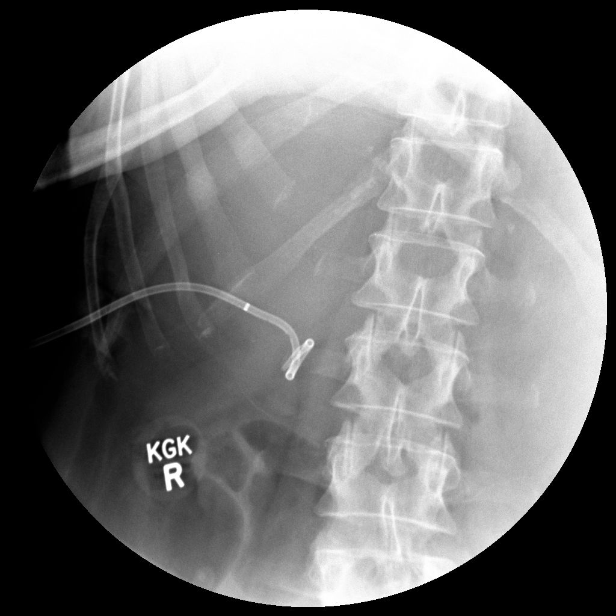

[Series 2: run · 1 of 1 slices shown (2 of 11)]
[im 1/1]
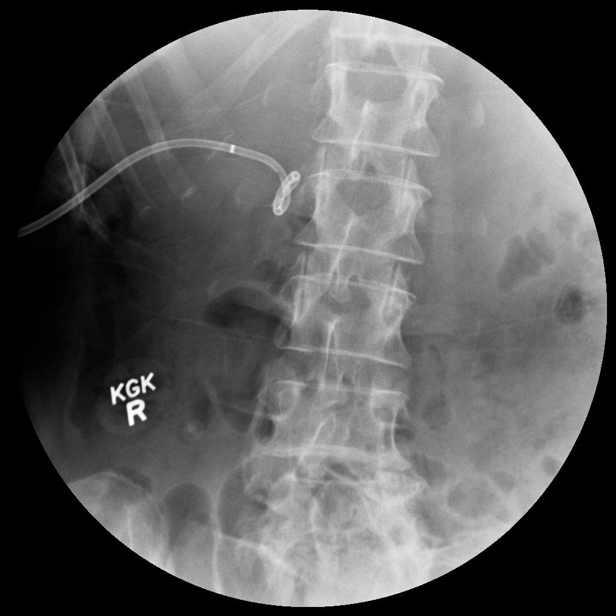

[Series 3: run · 1 of 1 slices shown (3 of 11)]
[im 1/1]
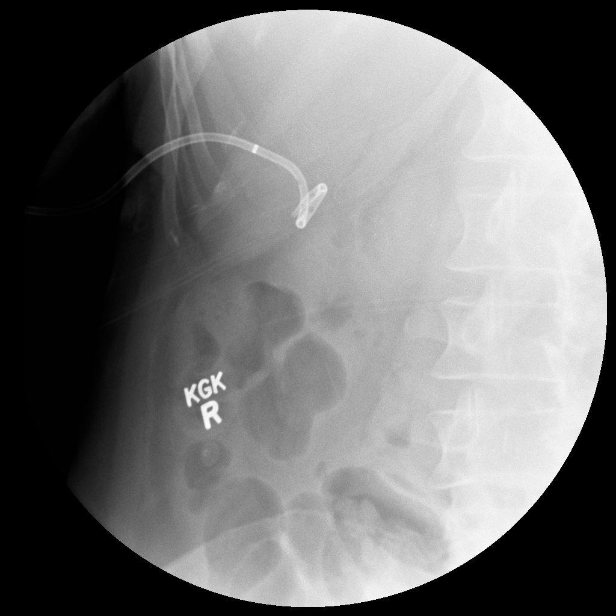

[Series 3: run · 1 of 1 slices shown (4 of 11)]
[im 1/1]
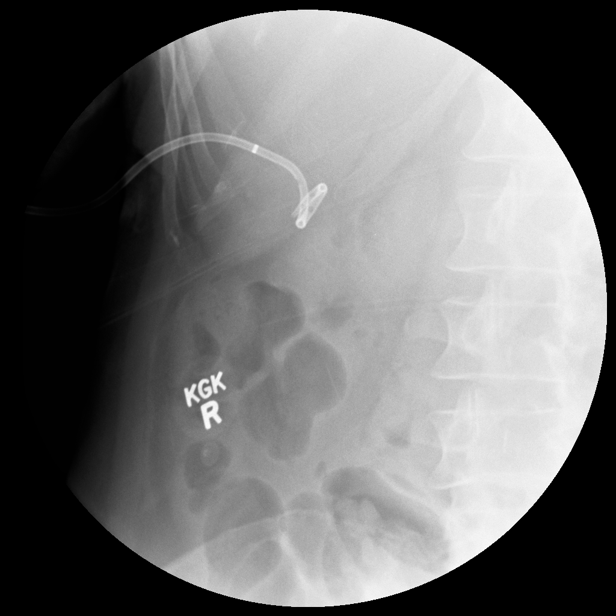

[Series 5: run · 1 of 1 slices shown (5 of 11)]
[im 1/1]
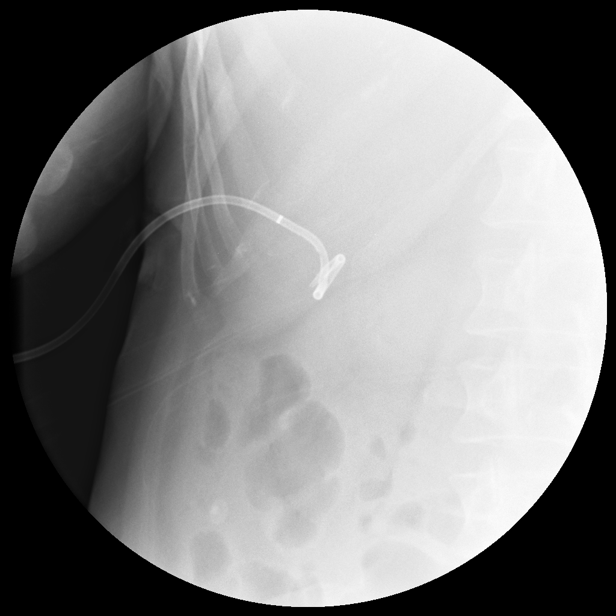

[Series 6: run · 1 of 1 slices shown (6 of 11)]
[im 1/1]
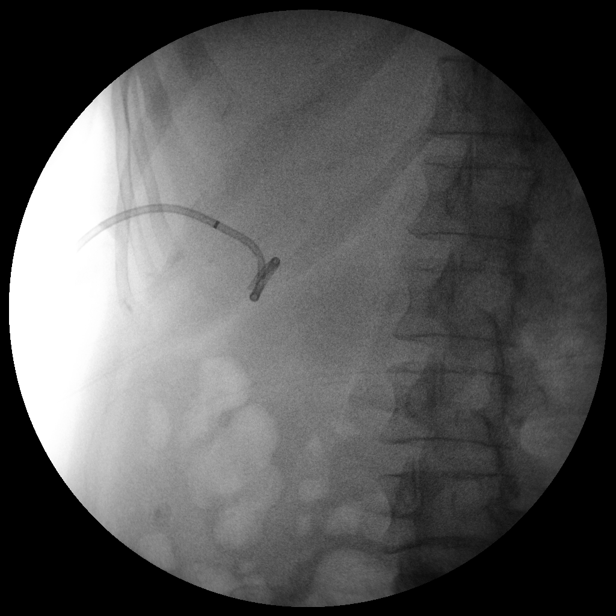

[Series 7: run · 1 of 3 slices shown (7 of 11)]
[im 3/3]
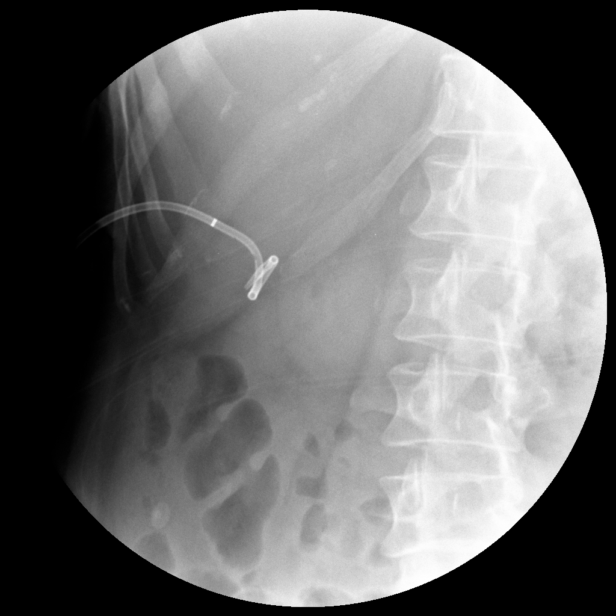

[Series 8: run · 4 of 7 slices shown (8 of 11)]
[im 2/7]
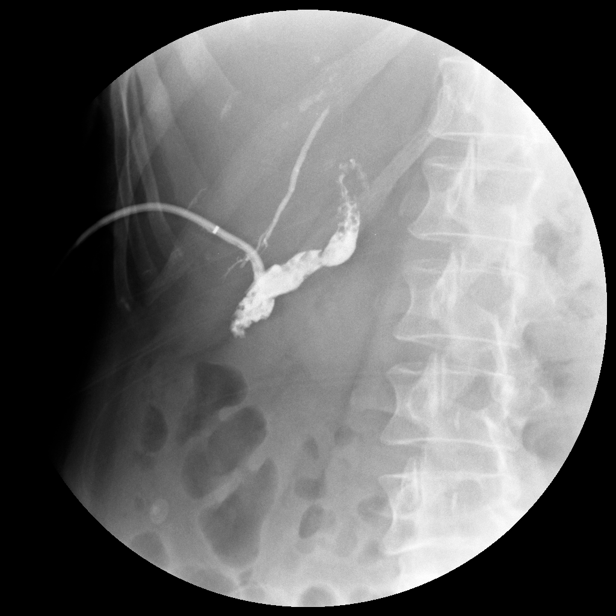
[im 3/7]
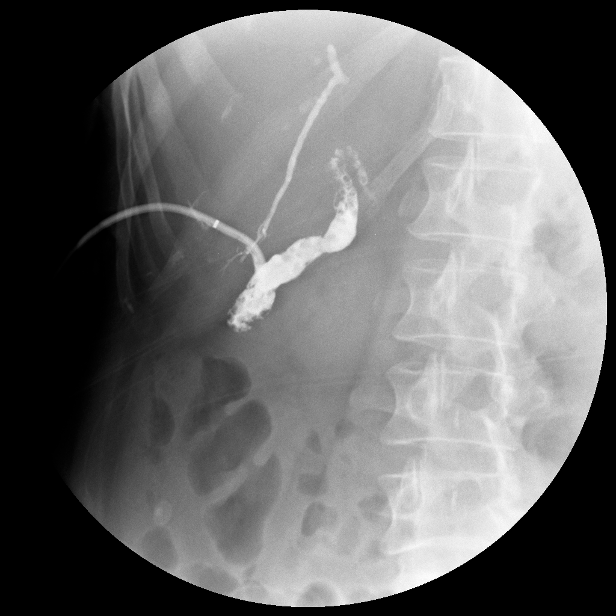
[im 5/7]
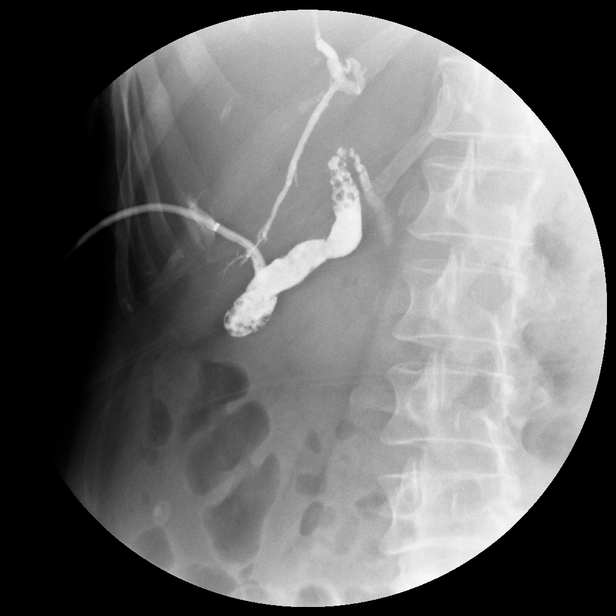
[im 7/7]
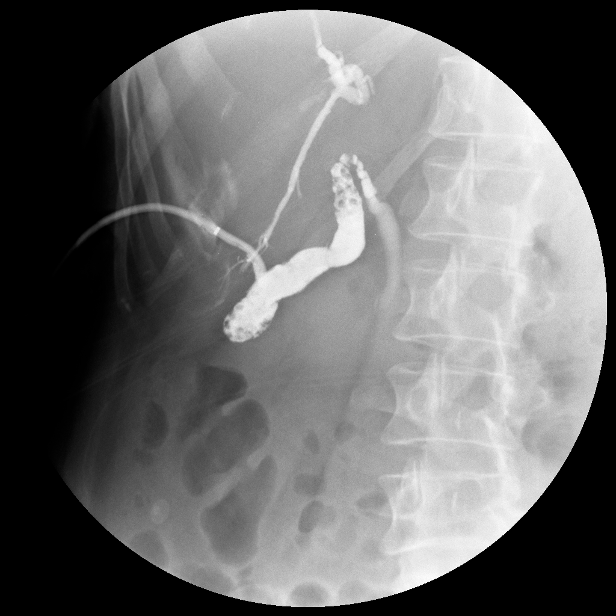

[Series 9: run · 1 of 3 slices shown (9 of 11)]
[im 2/3]
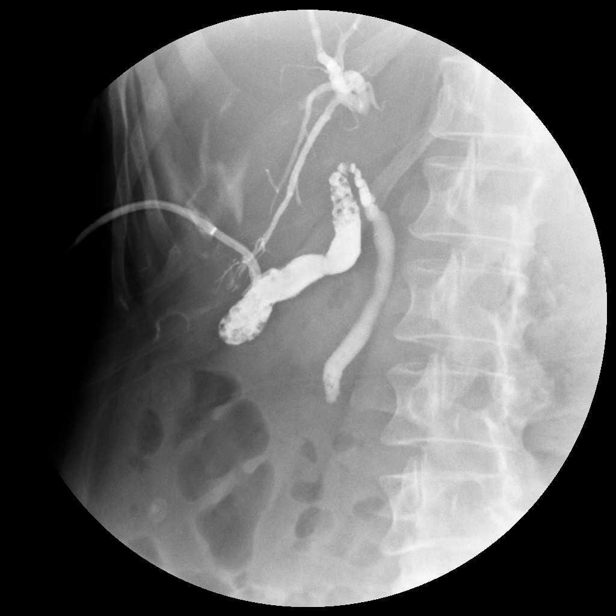

[Series 10: run · 2 of 2 slices shown (10 of 11)]
[im 1/2]
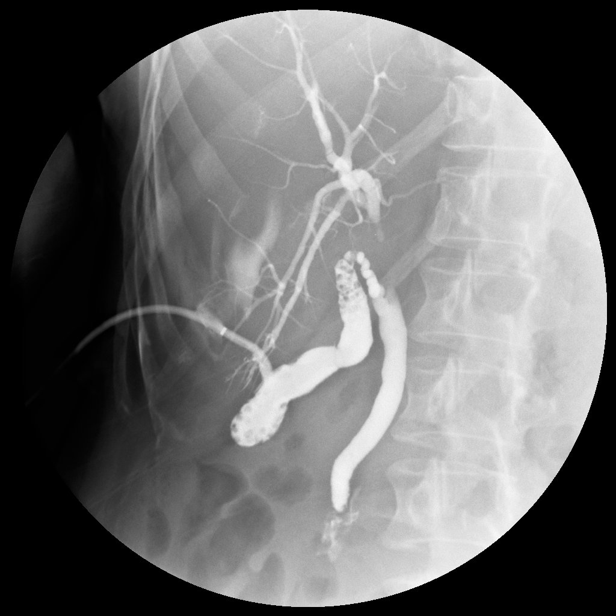
[im 2/2]
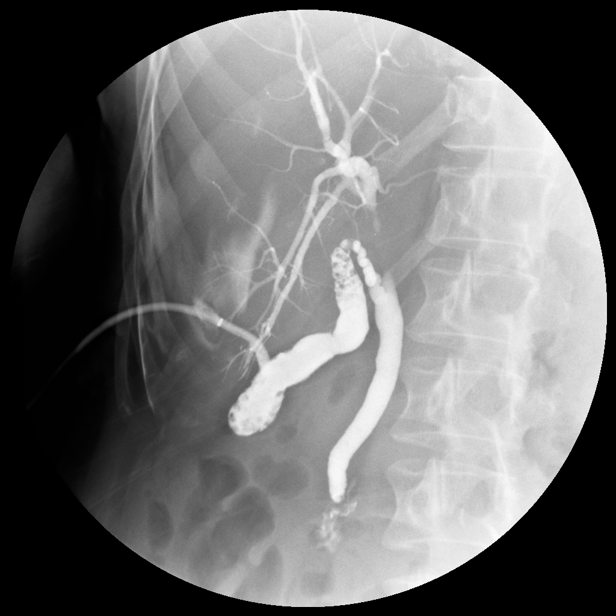

[Series 12: run · 1 of 1 slices shown (11 of 11)]
[im 1/1]
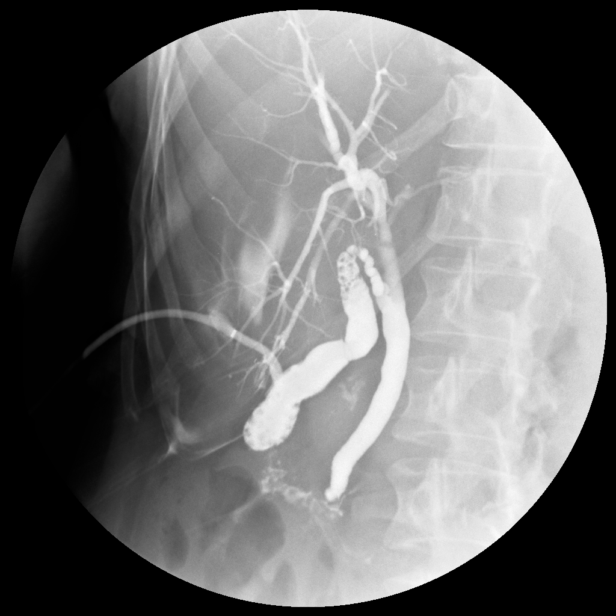

[15 of 24 positions shown; findings below may reference images not displayed]

FINDINGS: 18 ml of contrast was injected through the
cholecystostomy tube.  Contrast fills a contracted gallbladder and
demonstrates multiple small calculi within the lumen.  Contrast
flows into the common bile duct and duodenum without evidence of
obstruction.  There is no persistent filling defect within the
common bile duct.  No evidence of intrahepatic biliary ductal
dilatation.
IMPRESSION: 1.  Cholelithiasis.
2.  No evidence of filling defect within in or obstruction of the
common bile duct.

## 2014-03-06 ENCOUNTER — Other Ambulatory Visit: Payer: Self-pay | Admitting: *Deleted

## 2014-03-06 MED ORDER — AMBULATORY NON FORMULARY MEDICATION
Status: DC
Start: 2014-03-06 — End: 2017-09-22

## 2014-03-06 MED ORDER — CLONAZEPAM 1 MG PO TABS: ORAL_TABLET | ORAL | Status: DC

## 2014-03-06 MED ORDER — ALPRAZOLAM 0.25 MG PO TABS: ORAL_TABLET | ORAL | Status: DC

## 2014-03-06 NOTE — Telephone Encounter (Signed)
[facility] 

## 2014-03-08 ENCOUNTER — Encounter (HOSPITAL_COMMUNITY): Payer: Self-pay | Admitting: Internal Medicine

## 2014-03-08 ENCOUNTER — Emergency Department (HOSPITAL_COMMUNITY)

## 2014-03-08 ENCOUNTER — Inpatient Hospital Stay (HOSPITAL_COMMUNITY)

## 2014-03-08 ENCOUNTER — Inpatient Hospital Stay (HOSPITAL_COMMUNITY)
Admission: EM | Admit: 2014-03-08 | Discharge: 2014-03-10 | DRG: 178 | Disposition: A | Discharge status: Hospice | Attending: Internal Medicine | Admitting: Internal Medicine

## 2014-03-08 DIAGNOSIS — Z79899 Other long term (current) drug therapy: Secondary | ICD-10-CM | POA: Diagnosis not present

## 2014-03-08 DIAGNOSIS — Z7982 Long term (current) use of aspirin: Secondary | ICD-10-CM

## 2014-03-08 DIAGNOSIS — E039 Hypothyroidism, unspecified: Secondary | ICD-10-CM

## 2014-03-08 DIAGNOSIS — K219 Gastro-esophageal reflux disease without esophagitis: Secondary | ICD-10-CM | POA: Diagnosis present

## 2014-03-08 DIAGNOSIS — N390 Urinary tract infection, site not specified: Secondary | ICD-10-CM

## 2014-03-08 DIAGNOSIS — Z7401 Bed confinement status: Secondary | ICD-10-CM | POA: Diagnosis not present

## 2014-03-08 DIAGNOSIS — K5900 Constipation, unspecified: Secondary | ICD-10-CM | POA: Diagnosis present

## 2014-03-08 DIAGNOSIS — Z66 Do not resuscitate: Secondary | ICD-10-CM | POA: Diagnosis present

## 2014-03-08 DIAGNOSIS — J9610 Chronic respiratory failure, unspecified whether with hypoxia or hypercapnia: Secondary | ICD-10-CM | POA: Diagnosis present

## 2014-03-08 DIAGNOSIS — Z9049 Acquired absence of other specified parts of digestive tract: Secondary | ICD-10-CM

## 2014-03-08 DIAGNOSIS — I482 Chronic atrial fibrillation, unspecified: Secondary | ICD-10-CM

## 2014-03-08 DIAGNOSIS — J690 Pneumonitis due to inhalation of food and vomit: Principal | ICD-10-CM

## 2014-03-08 DIAGNOSIS — J189 Pneumonia, unspecified organism: Secondary | ICD-10-CM

## 2014-03-08 DIAGNOSIS — I480 Paroxysmal atrial fibrillation: Secondary | ICD-10-CM

## 2014-03-08 DIAGNOSIS — R1310 Dysphagia, unspecified: Secondary | ICD-10-CM | POA: Diagnosis present

## 2014-03-08 DIAGNOSIS — Z9089 Acquired absence of other organs: Secondary | ICD-10-CM

## 2014-03-08 DIAGNOSIS — Z9981 Dependence on supplemental oxygen: Secondary | ICD-10-CM

## 2014-03-08 DIAGNOSIS — G10 Huntington's disease: Secondary | ICD-10-CM | POA: Diagnosis not present

## 2014-03-08 DIAGNOSIS — R42 Dizziness and giddiness: Secondary | ICD-10-CM

## 2014-03-08 DIAGNOSIS — I4891 Unspecified atrial fibrillation: Secondary | ICD-10-CM | POA: Diagnosis present

## 2014-03-08 LAB — COMPREHENSIVE METABOLIC PANEL
ALBUMIN: 2.8 g/dL — AB (ref 3.5–5.2)
ALK PHOS: 104 U/L (ref 39–117)
ALT: 9 U/L (ref 0–35)
ANION GAP: 17 — AB (ref 5–15)
AST: 10 U/L (ref 0–37)
BILIRUBIN TOTAL: 0.3 mg/dL (ref 0.3–1.2)
BUN: 14 mg/dL (ref 6–23)
CHLORIDE: 99 meq/L (ref 96–112)
CO2: 24 mEq/L (ref 19–32)
Calcium: 8.5 mg/dL (ref 8.4–10.5)
Creatinine, Ser: 0.71 mg/dL (ref 0.50–1.10)
GFR calc Af Amer: >90 mL/min (ref >90)
GFR calc non Af Amer: 81 mL/min — ABNORMAL LOW (ref >90)
GLUCOSE: 133 mg/dL — AB (ref 70–99)
POTASSIUM: 3.4 meq/L — AB (ref 3.7–5.3)
SODIUM: 140 meq/L (ref 137–147)
Total Protein: 6.7 g/dL (ref 6.0–8.3)

## 2014-03-08 LAB — LACTIC ACID, PLASMA
LACTIC ACID, VENOUS: 3.8 mmol/L — AB (ref 0.5–2.2)
Lactic Acid, Venous: 2.8 mmol/L — ABNORMAL HIGH (ref 0.5–2.2)

## 2014-03-08 LAB — CBC WITH DIFFERENTIAL/PLATELET
BASOS PCT: 0 % (ref 0–1)
Basophils Absolute: 0 10*3/uL (ref 0.0–0.1)
Eosinophils Absolute: 0 10*3/uL (ref 0.0–0.7)
Eosinophils Relative: 0 % (ref 0–5)
HCT: 39.8 % (ref 36.0–46.0)
HEMOGLOBIN: 13 g/dL (ref 12.0–15.0)
LYMPHS ABS: 0.8 10*3/uL (ref 0.7–4.0)
Lymphocytes Relative: 5 % — ABNORMAL LOW (ref 12–46)
MCH: 29.5 pg (ref 26.0–34.0)
MCHC: 32.7 g/dL (ref 30.0–36.0)
MCV: 90.2 fL (ref 78.0–100.0)
MONOS PCT: 6 % (ref 3–12)
Monocytes Absolute: 1.1 10*3/uL — ABNORMAL HIGH (ref 0.1–1.0)
NEUTROS ABS: 16.4 10*3/uL — AB (ref 1.7–7.7)
NEUTROS PCT: 89 % — AB (ref 43–77)
Platelets: 299 10*3/uL (ref 150–400)
RBC: 4.41 MIL/uL (ref 3.87–5.11)
RDW: 14.1 % (ref 11.5–15.5)
WBC: 18.3 10*3/uL — AB (ref 4.0–10.5)

## 2014-03-08 LAB — MRSA PCR SCREENING: MRSA by PCR: POSITIVE — AB

## 2014-03-08 LAB — DIGOXIN LEVEL: Digoxin Level: 0.5 ng/mL — ABNORMAL LOW (ref 0.8–2.0)

## 2014-03-08 LAB — TROPONIN I: Troponin I: <0.3 ng/mL (ref <0.30)

## 2014-03-08 LAB — LIPASE, BLOOD: Lipase: 15 U/L (ref 11–59)

## 2014-03-08 MED ORDER — MUPIROCIN 2 % EX OINT
1.0000 "application " | TOPICAL_OINTMENT | Freq: Two times a day (BID) | CUTANEOUS | Status: DC
  Administered 2014-03-08 – 2014-03-10 (×5): 1 via NASAL
  Filled 2014-03-08: qty 22

## 2014-03-08 MED ORDER — LEVOFLOXACIN IN D5W 750 MG/150ML IV SOLN
750.0000 mg | Freq: Every day | INTRAVENOUS | Status: DC
  Administered 2014-03-08 – 2014-03-09 (×2): 750 mg via INTRAVENOUS
  Filled 2014-03-08 (×3): qty 150

## 2014-03-08 MED ORDER — MECLIZINE HCL 25 MG PO TABS
25.0000 mg | ORAL_TABLET | Freq: Three times a day (TID) | ORAL | Status: DC | PRN
  Filled 2014-03-08: qty 1

## 2014-03-08 MED ORDER — SODIUM CHLORIDE 0.9 % IV SOLN
1000.0000 mL | INTRAVENOUS | Status: DC
  Administered 2014-03-08: 1000 mL via INTRAVENOUS

## 2014-03-08 MED ORDER — ONDANSETRON HCL 4 MG PO TABS: 4.0000 mg | ORAL_TABLET | Freq: Four times a day (QID) | ORAL | Status: DC | PRN

## 2014-03-08 MED ORDER — CHLORHEXIDINE GLUCONATE CLOTH 2 % EX PADS
6.0000 | MEDICATED_PAD | Freq: Every day | CUTANEOUS | Status: DC
  Administered 2014-03-08 – 2014-03-10 (×3): 6 via TOPICAL

## 2014-03-08 MED ORDER — CETYLPYRIDINIUM CHLORIDE 0.05 % MT LIQD
7.0000 mL | Freq: Two times a day (BID) | OROMUCOSAL | Status: DC
  Administered 2014-03-08 – 2014-03-10 (×5): 7 mL via OROMUCOSAL

## 2014-03-08 MED ORDER — SODIUM CHLORIDE 0.9 % IV BOLUS (SEPSIS)
30.0000 mL/kg | Freq: Once | INTRAVENOUS | Status: AC
  Administered 2014-03-08: 07:00:00 via INTRAVENOUS

## 2014-03-08 MED ORDER — DIGOXIN 125 MCG PO TABS
0.1250 mg | ORAL_TABLET | Freq: Every day | ORAL | Status: DC
  Administered 2014-03-08 – 2014-03-10 (×3): 0.125 mg via ORAL
  Filled 2014-03-08 (×3): qty 1

## 2014-03-08 MED ORDER — ACETAMINOPHEN 325 MG PO TABS: 650.0000 mg | ORAL_TABLET | Freq: Four times a day (QID) | ORAL | Status: DC | PRN

## 2014-03-08 MED ORDER — ONDANSETRON HCL 4 MG/2ML IJ SOLN: 4.0000 mg | Freq: Four times a day (QID) | INTRAMUSCULAR | Status: DC | PRN

## 2014-03-08 MED ORDER — RESOURCE THICKENUP CLEAR PO POWD
ORAL | Status: DC | PRN
  Filled 2014-03-08: qty 125

## 2014-03-08 MED ORDER — ALBUTEROL SULFATE (2.5 MG/3ML) 0.083% IN NEBU: 2.5000 mg | INHALATION_SOLUTION | RESPIRATORY_TRACT | Status: DC | PRN

## 2014-03-08 MED ORDER — SODIUM CHLORIDE 0.9 % IV SOLN
INTRAVENOUS | Status: AC
  Administered 2014-03-08 – 2014-03-09 (×2): via INTRAVENOUS

## 2014-03-08 MED ORDER — ACETAMINOPHEN 650 MG RE SUPP: 650.0000 mg | Freq: Four times a day (QID) | RECTAL | Status: DC | PRN

## 2014-03-08 MED ORDER — BISACODYL 10 MG RE SUPP: 10.0000 mg | Freq: Every day | RECTAL | Status: DC | PRN

## 2014-03-08 MED ORDER — ALPRAZOLAM 0.25 MG PO TABS: 0.2500 mg | ORAL_TABLET | Freq: Two times a day (BID) | ORAL | Status: DC | PRN

## 2014-03-08 MED ORDER — POTASSIUM CHLORIDE 10 MEQ/100ML IV SOLN
10.0000 meq | INTRAVENOUS | Status: AC
  Administered 2014-03-08 (×4): 10 meq via INTRAVENOUS
  Filled 2014-03-08 (×4): qty 100

## 2014-03-08 MED ORDER — ASPIRIN EC 81 MG PO TBEC
81.0000 mg | DELAYED_RELEASE_TABLET | Freq: Every day | ORAL | Status: DC
  Administered 2014-03-08 – 2014-03-10 (×3): 81 mg via ORAL
  Filled 2014-03-08 (×3): qty 1

## 2014-03-08 MED ORDER — MORPHINE SULFATE (CONCENTRATE) 10 MG /0.5 ML PO SOLN: 5.0000 mg | ORAL | Status: DC | PRN

## 2014-03-08 MED ORDER — ENOXAPARIN SODIUM 40 MG/0.4ML ~~LOC~~ SOLN
40.0000 mg | Freq: Every day | SUBCUTANEOUS | Status: DC
  Administered 2014-03-08 – 2014-03-10 (×3): 40 mg via SUBCUTANEOUS
  Filled 2014-03-08 (×3): qty 0.4

## 2014-03-08 MED ORDER — SODIUM CHLORIDE 0.9 % IV SOLN: INTRAVENOUS | Status: DC

## 2014-03-08 MED ORDER — FLUOXETINE HCL 20 MG PO CAPS
20.0000 mg | ORAL_CAPSULE | Freq: Every day | ORAL | Status: DC
  Administered 2014-03-08 – 2014-03-10 (×3): 20 mg via ORAL
  Filled 2014-03-08 (×3): qty 1

## 2014-03-08 MED ORDER — STARCH (THICKENING) PO POWD: ORAL | Status: DC | PRN

## 2014-03-08 MED ORDER — POLYETHYLENE GLYCOL 3350 17 G PO PACK
17.0000 g | PACK | Freq: Every day | ORAL | Status: DC | PRN
  Filled 2014-03-08: qty 1

## 2014-03-08 MED ORDER — HALOPERIDOL 0.5 MG PO TABS
0.5000 mg | ORAL_TABLET | Freq: Two times a day (BID) | ORAL | Status: DC
  Administered 2014-03-09 – 2014-03-10 (×3): 0.5 mg via ORAL
  Filled 2014-03-08 (×4): qty 1

## 2014-03-08 MED ORDER — LEVOTHYROXINE SODIUM 75 MCG PO TABS
75.0000 ug | ORAL_TABLET | Freq: Every day | ORAL | Status: DC
  Administered 2014-03-09 – 2014-03-10 (×2): 75 ug via ORAL
  Filled 2014-03-08 (×4): qty 1

## 2014-03-08 NOTE — Progress Notes (Signed)
Attempted to call report, nurse unavailable.

## 2014-03-08 NOTE — H&P (Signed)
[facility] History and Physical  Sydney [name] UMP:[id number] DOB: Nov 05, 1934 DOA: 03/08/2014   PCP: Mathews Argyle, MD  Specialists: Unknown  Chief Complaint: Fever and nausea and vomiting  HPI: Sydney [name] is a 79 y.o. female with a past medical history of Huntington's disease, chronic atrial fibrillation, hypothyroidism, who is bedbound. Her caregiver is at the bedside. Patient usually lives at home with her caregiver, Dola Argyle, who is also her power of attorney. Over this weekend the caregiver had to go to the beach. So, she took the patient to an another facility for care for a few days. Patient was sent over from this facility overnight because patient was noted to have a fever. Apparently, after she was given her supper patient started having nausea, followed by vomiting. When EMS arrived at the scene patient was found to be hypoxic. Her saturations were in the 80s. She was given oxygen and was brought into the emergency department. In the ED, she was found to be tachycardic. She was given fluids with improvement. Patient opens her eyes on calling out her name, but doesn't really respond to questions. So history is very limited. Most of the history was obtained from the emergency department progress notes. According to the caregiver for the past month or so patient has been running low-grade fevers. She has been taken to her primary care physician. Urine was checked and there was no evidence for UTI. She also has been having some evidence of aspiration. Apparently she has spent a few days at hospice home. It was felt that her chronic disease was progressing.  Home Medications: Prior to Admission medications   Medication Sig Start Date End Date Taking? Authorizing Provider  acetaminophen (TYLENOL) 325 MG tablet Take 650 mg by mouth every 4 (four) hours as needed (pain).   Yes Historical Provider, MD  ALPRAZolam Duanne Moron) 0.25 MG tablet Take one tablet by mouth four times  daily as needed for anxiety/agitation/spastic movement; Take two tablets by mouth every night at bedtime as needed for anxiety/sleep 03/06/14  Yes Tiffany L Reed, DO  ALPRAZolam Duanne Moron) 0.25 MG tablet Take 0.25-0.5 mg by mouth 2 (two) times daily as needed for anxiety (anxiety). take 1tab 0.52m qdprn and 2tablets (0.581mqhsprn   Yes Historical Provider, MD  AMBULATORY NON FORMULARY MEDICATION Morphine Sul Sol 100/27m46mig: Administer 0.227m8mery 2 hours as needed for pain/ dyspnea; Administer 0.27ml 20mry 2 hours as needed for dyspnea/pain 03/06/14  Yes Tiffany L Reed, DO  aspirin EC 81 MG tablet Take 81 mg by mouth daily.     Yes Historical Provider, MD  atropine 1 % ophthalmic ointment Place 1 application under the tongue 3 (three) times daily. 3-4 drops under tongue for excessive secretions   Yes Historical Provider, MD  bisacodyl (DULCOLAX) 10 MG suppository Place 10 mg rectally as needed for moderate constipation (constipation).   Yes Historical Provider, MD  clonazePAM (KLONOPIN) 0.5 MG tablet Take 1 mg by mouth at bedtime.    Yes Historical Provider, MD  digoxin (LANOXIN) 0.25 MG tablet Take 0.125 mcg by mouth daily. Takes half a tablet   Yes Historical Provider, MD  estrogens, conjugated, (PREMARIN) 0.3 MG tablet Take 0.3 mg by mouth daily. Take daily for 21 days then do not take for 7 days.   Yes Historical Provider, MD  feeding supplement, ENSURE COMPLETE, (ENSURE COMPLETE) LIQD Take 120 mLs by mouth 2 (two) times daily between meals.   Yes Historical Provider, MD  FLUoxetine (PROZAC) 20 MG capsule  Take 20 mg by mouth daily.     Yes Historical Provider, MD  haloperidol (HALDOL) 0.5 MG tablet Take 0.5 mg by mouth 2 (two) times daily.     Yes Historical Provider, MD  ibuprofen (ADVIL,MOTRIN) 800 MG tablet Take 800 mg by mouth every 8 (eight) hours as needed. For pain    Yes Historical Provider, MD  levothyroxine (SYNTHROID, LEVOTHROID) 75 MCG tablet Take 75 mcg by mouth daily before breakfast.    Yes Historical Provider, MD  loperamide (IMODIUM A-D) 2 MG tablet Take 2 mg by mouth 4 (four) times daily as needed for diarrhea or loose stools (loose stools).   Yes Historical Provider, MD  meclizine (ANTIVERT) 25 MG tablet Take 25 mg by mouth 3 (three) times daily as needed. For dizziness/nausea.   Yes Historical Provider, MD  morphine (ROXANOL) 20 MG/ML concentrated solution Take 5-10 mg by mouth every 2 (two) hours as needed for severe pain (dyspnea/pain).   Yes Historical Provider, MD  nitroGLYCERIN (NITROSTAT) 0.4 MG SL tablet Place 0.4 mg under the tongue every 5 (five) minutes x 3 doses as needed.   Yes Historical Provider, MD  omeprazole (PRILOSEC) 20 MG capsule Take 20 mg by mouth daily.   Yes Historical Provider, MD  ondansetron (ZOFRAN-ODT) 8 MG disintegrating tablet Take 1 tablet (8 mg total) by mouth every 8 (eight) hours as needed for nausea. For nausea. 10/02/11  Yes Velvet Bathe, MD  polyethylene glycol (MIRALAX / GLYCOLAX) packet Take 17 g by mouth daily as needed. For constipation.   Yes Historical Provider, MD  promethazine (PHENERGAN) 25 MG suppository Place 25 mg rectally every 8 (eight) hours as needed. For nausea.   Yes Historical Provider, MD  senna-docusate (SENOKOT-S) 8.6-50 MG per tablet Take 2 tablets by mouth 2 (two) times daily.   Yes Historical Provider, MD    Allergies:  Allergies  Allergen Reactions  . Codeine Itching  . Penicillins Cross Reactors Hives    Past Medical History: Past Medical History  Diagnosis Date  . Huntington disease   . Atrial fibrillation   . Chronic respiratory failure     nocturnal oxygen  . Complication of anesthesia     felt surgery with epidural  . GERD (gastroesophageal reflux disease)   . Thyroid disease     Past Surgical History  Procedure Laterality Date  . Colon surgery    . Abdominal hysterectomy    . Hemorrhoid surgery    . Tonsillectomy    . Appendectomy    . Cholecystectomy  11/28/2011    Procedure: LAPAROSCOPIC  CHOLECYSTECTOMY;  Surgeon: Rolm Bookbinder, MD;  Location: [facility];  Service: General;  Laterality: N/A;    Social History: Patient usually lives at home, being cared for by her caregiver, but for the past few days she has been living in another facility. She is pretty much bedbound. Has been able to get around in a wheelchair in the past.  Family History:  Family History  Problem Relation Age of Onset  . Huntington's disease    . Diabetes type II    . Coronary artery disease    . Heart disease Mother   . Stroke Father   . Heart disease Brother      Review of Systems - unable to obtain due to her mental status  Physical Examination  Filed Vitals:   03/08/14 0615 03/08/14 0630 03/08/14 0645 03/08/14 0700  BP: 100/50 102/51 103/52 107/51  Pulse: 84 82 81 81  Resp: 21 22  25 25  SpO2: 97% 98% 97% 95%   Temperature not noted in Epic. Will have RN check.  BP 107/51  Pulse 81  Resp 25  SpO2 95%  General appearance: distracted, no distress, slowed mentation and uncooperative Head: Normocephalic, without obvious abnormality, atraumatic Eyes: conjunctivae/corneas clear. PERRL Throat: slightly dry mm Neck: no adenopathy, no carotid bruit, no JVD, supple, symmetrical, trachea midline and thyroid not enlarged, symmetric, no tenderness/mass/nodules Resp: Coarse breath sounds bilaterally. Crackles at the bases bilaterally. No wheezing Cardio: regular rate and rhythm, S1, S2 normal, no murmur, click, rub or gallop GI: soft, non-tender; bowel sounds normal; no masses,  no organomegaly Extremities: extremities normal, atraumatic, no cyanosis or edema Pulses: 2+ and symmetric Skin: Skin color, texture, turgor normal. No rashes or lesions Lymph nodes: Cervical, supraclavicular, and axillary nodes normal. Neurologic: She is somnolent. Arousable and opens her eyes. However, doesn't really cooperate with the rest of the examination.  Laboratory Data: Results for orders placed during the  hospital encounter of 03/08/14 (from the past 48 hour(s))  LACTIC ACID, PLASMA     Status: Abnormal   Collection Time    03/08/14  3:05 AM      Result Value Ref Range   Lactic Acid, Venous 3.8 (*) 0.5 - 2.2 mmol/L  TROPONIN I     Status: None   Collection Time    03/08/14  3:05 AM      Result Value Ref Range   Troponin I <0.30  <0.30 ng/mL   Comment:            Due to the release kinetics of cTnI,     a negative result within the first hours     of the onset of symptoms does not rule out     myocardial infarction with certainty.     If myocardial infarction is still suspected,     repeat the test at appropriate intervals.  CBC WITH DIFFERENTIAL     Status: Abnormal   Collection Time    03/08/14  3:08 AM      Result Value Ref Range   WBC 18.3 (*) 4.0 - 10.5 K/uL   RBC 4.41  3.87 - 5.11 MIL/uL   Hemoglobin 13.0  12.0 - 15.0 g/dL   HCT 39.8  36.0 - 46.0 %   MCV 90.2  78.0 - 100.0 fL   MCH 29.5  26.0 - 34.0 pg   MCHC 32.7  30.0 - 36.0 g/dL   RDW 14.1  11.5 - 15.5 %   Platelets 299  150 - 400 K/uL   Neutrophils Relative % 89 (*) 43 - 77 %   Neutro Abs 16.4 (*) 1.7 - 7.7 K/uL   Lymphocytes Relative 5 (*) 12 - 46 %   Lymphs Abs 0.8  0.7 - 4.0 K/uL   Monocytes Relative 6  3 - 12 %   Monocytes Absolute 1.1 (*) 0.1 - 1.0 K/uL   Eosinophils Relative 0  0 - 5 %   Eosinophils Absolute 0.0  0.0 - 0.7 K/uL   Basophils Relative 0  0 - 1 %   Basophils Absolute 0.0  0.0 - 0.1 K/uL  COMPREHENSIVE METABOLIC PANEL     Status: Abnormal   Collection Time    03/08/14  3:08 AM      Result Value Ref Range   Sodium 140  137 - 147 mEq/L   Potassium 3.4 (*) 3.7 - 5.3 mEq/L   Chloride 99  96 - 112 mEq/L  CO2 24  19 - 32 mEq/L   Glucose, Bld 133 (*) 70 - 99 mg/dL   BUN 14  6 - 23 mg/dL   Creatinine, Ser 0.71  0.50 - 1.10 mg/dL   Calcium 8.5  8.4 - 10.5 mg/dL   Total Protein 6.7  6.0 - 8.3 g/dL   Albumin 2.8 (*) 3.5 - 5.2 g/dL   AST 10  0 - 37 U/L   ALT 9  0 - 35 U/L   Alkaline Phosphatase  104  39 - 117 U/L   Total Bilirubin 0.3  0.3 - 1.2 mg/dL   GFR calc non Af Amer 81 (*) >90 mL/min   GFR calc Af Amer >90  >90 mL/min   Comment: (NOTE)     The eGFR has been calculated using the CKD EPI equation.     This calculation has not been validated in all clinical situations.     eGFR's persistently <90 mL/min signify possible Chronic Kidney     Disease.   Anion gap 17 (*) 5 - 15    Radiology Reports: Dg Chest Port 1 View  03/08/2014   CLINICAL DATA:  Fever.  EXAM: CHEST - 1 VIEW  COMPARISON:  None.  FINDINGS: The lungs are well-aerated. Left basilar airspace opacities raise concern for pneumonia. Mild right basilar airspace opacities osseous seen. Mild vascular congestion is noted. There is no evidence of pleural effusion or pneumothorax.  The cardiomediastinal silhouette is borderline normal in size. No acute osseous abnormalities are seen.  IMPRESSION: Bibasilar airspace opacities, left greater than right, raise concern for multifocal pneumonia.   Electronically Signed   By: Garald Balding M.D.   On: 03/08/2014 03:47    Electrocardiogram: EKG cannot be located  Problem List  Principal Problem:   Pneumonia Active Problems:   Aspiration pneumonia   Huntington's disease   Campath-induced atrial fibrillation   Hypothyroidism   Assessment: This is a 79 year old, Caucasian female, who presents with fever, nausea, vomiting and is found to have by basilar opacities, left rib and right. Concern is about pneumonia. Based on history this appears to be aspiration pneumonia. Element of community-acquired pneumonia cannot be ruled out.   Plan: #1 pneumonia: Possible aspiration: There is a reported allergy to penicillin. We will treat her with Levaquin for now. We will get speech therapist to evaluate her swallow function. She'll be kept n.p.o. for now. Repeat lactic acid level.   #2 nausea and vomiting: We will check a UA for UTI. We'll check abdominal X rays. Lipase will be checked.  LFTs are normal. No clear reason for her symptoms is present. She hasn't been noted to have any vomiting in the ED.  #3 history of Huntington's disease: According to PCP disease appears to be progressing. Continue to monitor. Depending on her progress she may be a candidate for hospice care eventually.  #4 history of chronic atrial fibrillation: She not on anticoagulation, presumably due to her chronic progressive illness. Continue digoxin. Check a digoxin level.  #5 history of hypothyroidism: Continue with her home medications.   DVT Prophylaxis: Lovenox Code Status: She is a DO NOT RESUSCITATE based on my conversation with her caregiver Family Communication: Discussed with the caregiver Dola Argyle. Her cell phone number is [phone]  and home phone number is [phone]  Disposition Plan: Admitted to the inpatient setting. Will presumably return home when ready for discharge  Further management decisions will depend on results of further testing and patient's response to treatment.   Bonnielee Haff  [facility] Pager [phone]  If 7PM-7AM, please contact night-coverage [email or web address] Password [facility]  03/08/2014, 7:38 AM  Disclaimer: This note was dictated with voice recognition software. Similar sounding words can inadvertently be transcribed and may not be corrected upon review.

## 2014-03-08 NOTE — Progress Notes (Signed)
[facility] MSW note: Pt is a current hospice home care patient. Pt is a DNR. Pt was [facility] for a 5 night respite when she started to have a fever and vomited. Pt lives with friend/caregiver-[name] Caralee AtesAndrews who has cared for pt in the home for a long time. Pt stated that she was "sore all over". MSW discussed hospital discharge plans with Fannie KneeSue. Plans are for pt to return home with continued [facility] services. Pt will need transport home. DME is already in the home. Please call with questions or concerns. Joint visit made with Dayna BarkerKaren Robertson, RN liason.   Elijio Milesebbie Garner, MSW  [phone]

## 2014-03-08 NOTE — Progress Notes (Signed)
Utilization review completed.  

## 2014-03-08 NOTE — Progress Notes (Signed)
Sydney [name] [id number] Code Status: DNR Admission Data: 03/08/2014 8:34 AM Attending Provider:  Rito EhrlichKrishnan WUJ:[id number],NFAPCP:STONEKING,HAL Maisie FusHOMAS, MD Consults/ Treatment Team:  Internal Medicine   Sydney [name] is a 79 y.o. female patient admitted from ED awake, alert - oriented  X 3 - no acute distress noted.  VSS - Blood pressure 109/70, pulse 81, temperature 98.5 F (36.9 C), temperature source Oral, resp. rate 18, height 5\' 5"  (1.651 m), weight 63.3 kg (139 lb 8.8 oz), SpO2 95.00%.    IV in place, occlusive dsg intact without redness.  Orientation to room, and floor completed with information packet given to patient/family.  Patient declined safety video at this time.  Admission INP armband ID verified with patient/family, and in place.   SR up x 2, fall assessment complete, with patient and family able to verbalize understanding of risk associated with falls, and verbalized understanding to call nsg before up out of bed.  Call light within reach, patient able to voice, and demonstrate understanding.  Skin, clean-dry- intact without evidence of bruising, or skin tears.   No evidence of skin break down noted on exam. Caregiver at bedside.      Will cont to eval and treat per MD orders.  Aldean AstLEsperance, Tenille Morrill C, RN 03/08/2014 8:34 AM

## 2014-03-08 NOTE — Progress Notes (Signed)
Inpatient [name] visit- Sydney [name]  [facility] of Huntington's dz.  Pt is DNR code.     Pt seen at bedside, sitting up, alert and appears oriented to self, surroundings and care givers present. [facility] Elijio Milesebbie Garner, pt had c/o pain all over, will report to staff [name]. Per chart review pt admitted for pneumonia and is recvg IV abt.  Pt care giver Fannie KneeSue plans for patient to return home to continue hospice services at discharge. [facility] will continue to follow. Patient's home medication list and transfer summary in place on shadow chart.   Please call [facility] @ [phone]-  with any hospice needs.   Thank you. Hansel StarlingKaren E. Robertson, [name]  [facility]  Hospice Liaison  [phone])

## 2014-03-08 NOTE — Care Management Note (Addendum)
    Page 1 of 1   03/10/2014     10:02:10 AM CARE MANAGEMENT NOTE 03/10/2014  Patient:  Sydney [name],Sydney [name]   Account Number:  [id number]  Date Initiated:  03/08/2014  Documentation initiated by:  Sydney [name],Sydney [name]  Subjective/Objective Assessment:   dx pna  admit-from home with [facility] and they use [facility] for Respite care per POA Sydney [name] cell [phone], home phone [phone].     Action/Plan:   [facility] at home.   Anticipated DC Date:  03/10/2014   Anticipated DC Plan:  HOME W HOSPICE CARE  In-house referral  Clinical Social Worker      DC Planning Services  CM consult      PAC Choice  HOSPICE  Resumption Of Svcs/PTA Provider   Choice offered to / List presented to:             Status of service:  Completed, signed off Medicare Important Message given?  NO (If response is "NO", the following Medicare IM given date fields will be blank) Date Medicare IM given:   Medicare IM given by:   Date Additional Medicare IM given:   Additional Medicare IM given by:    Discharge Disposition:  HOME W HOSPICE CARE  Per UR Regulation:  Reviewed for med. necessity/level of care/duration of stay  If discussed at Long Length of Stay Meetings, dates discussed:    Comments:  03/10/14 0943 Sydney Capeeborah Lachelle Rissler RN, BSN [phone] patient is for dc today, will need ambulance transport. NCM spoke with Sydney [name], POA, she would like ambulance transport at 5 pm, CSW aware.  NCM also called Sydney [name] with [facility] to inform her that transport will be set for 5 pm.  03/07/14 1450 Sydney Capeeborah Sydney Avey RN, BSN [phone] patient is with [facility] per Sydney [name] , POA, they plan to contine with [facility].

## 2014-03-08 NOTE — ED Provider Notes (Signed)
CSN: [id number]     Arrival date & time 03/08/14  0258 History   None    Chief Complaint  Patient presents with  . Aspiration     (Consider location/radiation/quality/duration/timing/severity/associated sxs/prior Treatment) HPI Patient has a history of Huntington's disease and is staying temporarily in a nursing home are her caretaker is on vacation. Staff noted that the patient was febrile this evening on rounds in that she vomited. Concern for aspiration. She is found to be hypoxic by EMS into the 80s. Was given supplemental oxygen. Patient also noted to be tachycardic and febrile in route. The patient is nonverbal but follows simple commands. This is her baseline per nursing home staff. Level V caveat applies. Past Medical History  Diagnosis Date  . Huntington disease   . Atrial fibrillation   . Chronic respiratory failure     nocturnal oxygen  . Complication of anesthesia     felt surgery with epidural  . GERD (gastroesophageal reflux disease)   . Thyroid disease    Past Surgical History  Procedure Laterality Date  . Colon surgery    . Abdominal hysterectomy    . Hemorrhoid surgery    . Tonsillectomy    . Appendectomy    . Cholecystectomy  11/28/2011    Procedure: LAPAROSCOPIC CHOLECYSTECTOMY;  Surgeon: Emelia LoronMatthew Wakefield, MD;  Location: [facility];  Service: General;  Laterality: N/A;   Family History  Problem Relation Age of Onset  . Huntington's disease    . Diabetes type II    . Coronary artery disease    . Heart disease Mother   . Stroke Father   . Heart disease Brother    History  Substance Use Topics  . Smoking status: Former Smoker    Quit date: 09/22/1998  . Smokeless tobacco: Never Used  . Alcohol Use: No   OB History   Grav Para Term Preterm Abortions TAB SAB Ect Mult Living                 Review of Systems  Unable to perform ROS: Patient nonverbal  Constitutional: Positive for fever.  Gastrointestinal: Positive for vomiting.      Allergies   Codeine and Penicillins cross reactors  Home Medications   Prior to Admission medications   Medication Sig Start Date End Date Taking? Authorizing Provider  ALPRAZolam Prudy Feeler(XANAX) 0.25 MG tablet Take one tablet by mouth four times daily as needed for anxiety/agitation/spastic movement; Take two tablets by mouth every night at bedtime as needed for anxiety/sleep 03/06/14   Tiffany L Reed, DO  AMBULATORY NON FORMULARY MEDICATION Morphine Sul Sol 100/265ml Sig: Administer 0.4025ml every 2 hours as needed for pain/ dyspnea; Administer 0.565ml every 2 hours as needed for dyspnea/pain 03/06/14   Tiffany L Reed, DO  aspirin EC 81 MG tablet Take 81 mg by mouth daily.      Historical Provider, MD  clonazePAM (KLONOPIN) 0.5 MG tablet Take 1 mg by mouth at bedtime.     Historical Provider, MD  digoxin (LANOXIN) 0.25 MG tablet Take 250 mcg by mouth daily.      Historical Provider, MD  FLUoxetine (PROZAC) 20 MG capsule Take 20 mg by mouth daily.      Historical Provider, MD  haloperidol (HALDOL) 0.5 MG tablet Take 0.5 mg by mouth 2 (two) times daily.      Historical Provider, MD  ibuprofen (ADVIL,MOTRIN) 800 MG tablet Take 800 mg by mouth every 8 (eight) hours as needed. For pain  Historical Provider, MD  meclizine (ANTIVERT) 25 MG tablet Take 25 mg by mouth 3 (three) times daily as needed. For dizziness/nausea.    Historical Provider, MD  nitroGLYCERIN (NITROSTAT) 0.4 MG SL tablet Place 0.4 mg under the tongue every 5 (five) minutes x 3 doses as needed.    Historical Provider, MD  omeprazole (PRILOSEC) 20 MG capsule Take 20 mg by mouth daily.    Historical Provider, MD  ondansetron (ZOFRAN-ODT) 8 MG disintegrating tablet Take 1 tablet (8 mg total) by mouth every 8 (eight) hours as needed for nausea. For nausea. 10/02/11   Penny Pia, MD  polyethylene glycol (MIRALAX / GLYCOLAX) packet Take 17 g by mouth daily as needed. For constipation.    Historical Provider, MD  promethazine (PHENERGAN) 25 MG suppository Place  25 mg rectally every 8 (eight) hours as needed. For nausea.    Historical Provider, MD  senna-docusate (SENOKOT-S) 8.6-50 MG per tablet Take 2 tablets by mouth 2 (two) times daily.    Historical Provider, MD   BP 106/53  Pulse 89  Resp 24  SpO2 94% Physical Exam  Nursing note and vitals reviewed. Constitutional: She appears well-developed and well-nourished. No distress.  HENT:  Head: Normocephalic and atraumatic.  Mouth/Throat: Oropharynx is clear and moist. No oropharyngeal exudate.  Eyes: EOM are normal. Pupils are equal, round, and reactive to light.  Neck: Normal range of motion. Neck supple.  Cardiovascular: Normal rate and regular rhythm.   Pulmonary/Chest: Breath sounds normal. No respiratory distress. She has no wheezes. She has no rales. She exhibits no tenderness.  Increased respiratory effort with tachypnea.  Bilateral coarse breath sounds in lower lung fields  Abdominal: Soft. Bowel sounds are normal. She exhibits no distension and no mass. There is no tenderness. There is no rebound and no guarding.  Musculoskeletal: Normal range of motion. She exhibits no edema and no tenderness.  Neurological: She is alert.  Patient is alert and following commands. She is able to wiggle toes bilaterally. No grip strength.  Skin: Skin is warm and dry. No rash noted. No erythema.  Psychiatric: She has a normal mood and affect. Her behavior is normal.    ED Course  Procedures (including critical care time) Labs Review Labs Reviewed  CBC WITH DIFFERENTIAL - Abnormal; Notable for the following:    WBC 18.3 (*)    Neutrophils Relative % 89 (*)    Neutro Abs 16.4 (*)    Lymphocytes Relative 5 (*)    Monocytes Absolute 1.1 (*)    All other components within normal limits  COMPREHENSIVE METABOLIC PANEL - Abnormal; Notable for the following:    Potassium 3.4 (*)    Glucose, Bld 133 (*)    Albumin 2.8 (*)    GFR calc non Af Amer 81 (*)    Anion gap 17 (*)    All other components within  normal limits  LACTIC ACID, PLASMA - Abnormal; Notable for the following:    Lactic Acid, Venous 3.8 (*)    All other components within normal limits  CULTURE, BLOOD (ROUTINE X 2)  CULTURE, BLOOD (ROUTINE X 2)  TROPONIN I    Imaging Review Dg Chest Port 1 View  03/08/2014   CLINICAL DATA:  Fever.  EXAM: CHEST - 1 VIEW  COMPARISON:  None.  FINDINGS: The lungs are well-aerated. Left basilar airspace opacities raise concern for pneumonia. Mild right basilar airspace opacities osseous seen. Mild vascular congestion is noted. There is no evidence of pleural effusion or pneumothorax.  The cardiomediastinal silhouette is borderline normal in size. No acute osseous abnormalities are seen.  IMPRESSION: Bibasilar airspace opacities, left greater than right, raise concern for multifocal pneumonia.   Electronically Signed   By: Roanna Raider M.D.   On: 03/08/2014 03:47     EKG Interpretation None      MDM   Final diagnoses:  None    Caretaker is at bedside. She states neurological the patient is at her baseline. Sepsis protocols began in emergency department. We'll admit to hospitalist.  Patient's heart rate improved with IV fluids. Remained stable in the emergency department. Discuss with hospitalist and we'll admit to a MedSurg bed.  Loren Racer, MD 03/08/14 [phone]

## 2014-03-08 NOTE — Evaluation (Signed)
Clinical/Bedside Swallow Evaluation Patient Details  Name: Sydney [name] MRN: [id number] Date of Birth: 25-Jan-1935  Today's Date: 03/08/2014 Time: 1030-1110 SLP Time Calculation (min): 40 min  Past Medical History:  Past Medical History  Diagnosis Date  . Huntington disease   . Atrial fibrillation   . Chronic respiratory failure     nocturnal oxygen  . Complication of anesthesia     felt surgery with epidural  . GERD (gastroesophageal reflux disease)   . Thyroid disease    Past Surgical History:  Past Surgical History  Procedure Laterality Date  . Colon surgery    . Abdominal hysterectomy    . Hemorrhoid surgery    . Tonsillectomy    . Appendectomy    . Cholecystectomy  11/28/2011    Procedure: LAPAROSCOPIC CHOLECYSTECTOMY;  Surgeon: Emelia Loron, MD;  Location: [facility];  Service: General;  Laterality: N/A;   HPI:  Sydney [name] is a 79 y.o. female with a past medical history of Huntington's disease, chronic atrial fibrillation, hypothyroidism, who is bedbound.Patient usually lives at home with her caregiver, Eugenio Hoes, who is also her power of attorney. Apparently, after she was given her supper patient started having nausea, followed by vomiting. When EMS arrived at the scene patient was found to be hypoxic. Her saturations were in the 80s. According to the caregiver for the past month or so patient has been running low-grade fevers. She has been taken to her primary care physician. Urine was checked and there was no evidence for UTI. She also has been having some evidence of aspiration. Apparently she has spent a few days at hospice home. It was felt that her chronic disease was progressing.   Assessment / Plan / Recommendation Clinical Impression  Pt demonstrates overt signs of aspiration due to progressive neuromotor disorder causing impaired oral coordination and and delayed swallow response. With thin liquids and caregiver feeding pt and controlling bolus size with  straw, pt has consistent late congested cough after each swallow indicating probable silent and sensed aspiration events. SLP observed caregivers provide trials of nectar thick liquids via straw with much improved result, no coughing over a 15 minute interval. Pt is able to masticate solids, but is prone to fatigue and oral holding per her caregiver. SLP recommend a dys 2/nectar thick diet. Modified diet will not prevent aspiration, but it may reduce severity of aspiration events. Provided instruction regarding thickening, precautions and strategies today, but caregiver will need f/u tomorrow to carry over strategies at home. She believes thickening is worth trying if pt will not refuse POs. Will f/u for further education.     Aspiration Risk  Moderate    Diet Recommendation Dysphagia 2 (Fine chop);Nectar-thick liquid   Liquid Administration via: Straw Medication Administration: Whole meds with puree Supervision: Trained caregiver to feed patient Compensations: Slow rate;Small sips/bites (limit pt to small sips, pull straw away) Postural Changes and/or Swallow Maneuvers: Seated upright 90 degrees;Out of bed for meals    Other  Recommendations Oral Care Recommendations: Oral care BID Other Recommendations: Order thickener from pharmacy   Follow Up Recommendations  24 hour supervision/assistance    Frequency and Duration min 2x/week  2 weeks   Pertinent Vitals/Pain NA    SLP Swallow Goals     Swallow Study Prior Functional Status       General HPI: Sydney [name] is a 79 y.o. female with a past medical history of Huntington's disease, chronic atrial fibrillation, hypothyroidism, who is bedbound.Patient usually lives at  home with her caregiver, Eugenio HoesSue Andrews, who is also her power of attorney. Apparently, after she was given her supper patient started having nausea, followed by vomiting. When EMS arrived at the scene patient was found to be hypoxic. Her saturations were in the 80s.  According to the caregiver for the past month or so patient has been running low-grade fevers. She has been taken to her primary care physician. Urine was checked and there was no evidence for UTI. She also has been having some evidence of aspiration. Apparently she has spent a few days at hospice home. It was felt that her chronic disease was progressing. Type of Study: Bedside swallow evaluation Previous Swallow Assessment: BSE in 2013 Diet Prior to this Study: NPO Temperature Spikes Noted: No Respiratory Status: Room air History of Recent Intubation: No Behavior/Cognition: Alert;Cooperative;Pleasant mood Oral Cavity - Dentition: Missing dentition Self-Feeding Abilities: Total assist Patient Positioning: Upright in bed Baseline Vocal Quality: Clear Volitional Cough: Congested Volitional Swallow: Unable to elicit    Oral/Motor/Sensory Function Overall Oral Motor/Sensory Function: Appears within functional limits for tasks assessed (discoordinated but strong)   Ice Chips     Thin Liquid Thin Liquid: Impaired Presentation: Straw Oral Phase Impairments: Impaired anterior to posterior transit Pharyngeal  Phase Impairments: Suspected delayed Swallow;Cough - Delayed    Nectar Thick Nectar Thick Liquid: Impaired Presentation: Straw Pharyngeal Phase Impairments: Suspected delayed Swallow   Honey Thick     Puree Puree: Impaired Presentation: Spoon Pharyngeal Phase Impairments: Suspected delayed Swallow   Solid   GO    Solid: Impaired Oral Phase Impairments: Reduced lingual movement/coordination Pharyngeal Phase Impairments: Suspected delayed Swallow      Harlon DittyBonnie Rhylin Venters, MA CCC-SLP [phone]  Cheyna Retana, Riley NearingBonnie Caroline 03/08/2014,11:27 AM

## 2014-03-09 LAB — COMPREHENSIVE METABOLIC PANEL
ALBUMIN: 2.2 g/dL — AB (ref 3.5–5.2)
ALT: 6 U/L (ref 0–35)
AST: 8 U/L (ref 0–37)
Alkaline Phosphatase: 88 U/L (ref 39–117)
Anion gap: 13 (ref 5–15)
BUN: 7 mg/dL (ref 6–23)
CALCIUM: 8.2 mg/dL — AB (ref 8.4–10.5)
CO2: 21 mEq/L (ref 19–32)
Chloride: 107 mEq/L (ref 96–112)
Creatinine, Ser: 0.62 mg/dL (ref 0.50–1.10)
GFR calc Af Amer: >90 mL/min (ref >90)
GFR calc non Af Amer: 85 mL/min — ABNORMAL LOW (ref >90)
Glucose, Bld: 109 mg/dL — ABNORMAL HIGH (ref 70–99)
Potassium: 3.7 mEq/L (ref 3.7–5.3)
SODIUM: 141 meq/L (ref 137–147)
TOTAL PROTEIN: 5.7 g/dL — AB (ref 6.0–8.3)
Total Bilirubin: 0.3 mg/dL (ref 0.3–1.2)

## 2014-03-09 LAB — URINALYSIS, ROUTINE W REFLEX MICROSCOPIC
BILIRUBIN URINE: NEGATIVE
GLUCOSE, UA: NEGATIVE mg/dL
KETONES UR: NEGATIVE mg/dL
Leukocytes, UA: NEGATIVE
Nitrite: NEGATIVE
PROTEIN: NEGATIVE mg/dL
Specific Gravity, Urine: 1.01 (ref 1.005–1.030)
Urobilinogen, UA: 0.2 mg/dL (ref 0.0–1.0)
pH: 6.5 (ref 5.0–8.0)

## 2014-03-09 LAB — CBC
HEMATOCRIT: 35.7 % — AB (ref 36.0–46.0)
HEMOGLOBIN: 11.4 g/dL — AB (ref 12.0–15.0)
MCH: 29.4 pg (ref 26.0–34.0)
MCHC: 31.9 g/dL (ref 30.0–36.0)
MCV: 92 fL (ref 78.0–100.0)
Platelets: 264 10*3/uL (ref 150–400)
RBC: 3.88 MIL/uL (ref 3.87–5.11)
RDW: 14.7 % (ref 11.5–15.5)
WBC: 14.5 10*3/uL — ABNORMAL HIGH (ref 4.0–10.5)

## 2014-03-09 LAB — URINE MICROSCOPIC-ADD ON

## 2014-03-09 LAB — STREP PNEUMONIAE URINARY ANTIGEN: STREP PNEUMO URINARY ANTIGEN: NEGATIVE

## 2014-03-09 LAB — CG4 I-STAT (LACTIC ACID): LACTIC ACID, VENOUS: 3.81 mmol/L — AB (ref 0.5–2.2)

## 2014-03-09 MED ORDER — SENNOSIDES-DOCUSATE SODIUM 8.6-50 MG PO TABS
2.0000 | ORAL_TABLET | Freq: Every day | ORAL | Status: DC
  Administered 2014-03-09: 2 via ORAL
  Filled 2014-03-09: qty 2

## 2014-03-09 MED ORDER — FLEET ENEMA 7-19 GM/118ML RE ENEM
1.0000 | ENEMA | Freq: Once | RECTAL | Status: AC
  Administered 2014-03-09: 1 via RECTAL
  Filled 2014-03-09: qty 1

## 2014-03-09 MED ORDER — POLYETHYLENE GLYCOL 3350 17 G PO PACK
17.0000 g | PACK | Freq: Two times a day (BID) | ORAL | Status: DC
  Administered 2014-03-09 – 2014-03-10 (×3): 17 g via ORAL
  Filled 2014-03-09 (×4): qty 1

## 2014-03-09 MED ORDER — FLEET ENEMA 7-19 GM/118ML RE ENEM
1.0000 | ENEMA | Freq: Every day | RECTAL | Status: DC | PRN
  Filled 2014-03-09: qty 1

## 2014-03-09 NOTE — Progress Notes (Signed)
Attempt to in and out cath in order to obtain urine sample - unable to obtain. Will ask charge nurse to attempt.

## 2014-03-09 NOTE — Progress Notes (Signed)
Inpatient RN visit- [facility] RN Visit-Karen Merilynn FinlandRobertson RN [facility] Huntington's dz.   Pt is DNR  code.   Upon entering the room pt was involved with speech therapy, several family members an cg Fannie KneeSue present. Rn greeted pt and Cg from the door as not to interrupt. [facility] Chaplain Lovenia ShuckJohn Connor also present. No concerns voiced from staff RN Fleet Contrasachel. Patient's home medication list, transfer summary and in place on shadow chart.  [facility] will continue to follow. Please call [facility] @ [phone]- RN with any hospice needs.   Thank you. Hansel StarlingKaren E. Robertson, RN  [facility]  Hospice Liaison  [phone])

## 2014-03-09 NOTE — Progress Notes (Signed)
PATIENT DETAILS Name: Sydney [name] Age: 79 y.o. Sex: female Date of Birth: March 05, 1935 Admit Date: 03/08/2014 Admitting Physician Eduard Clos, MD ZOX:[id number],WJX [name], MD  Subjective: No major events overnight, primary caregiver at bedside-patient improved and on initial presentation  Assessment/Plan: Principal Problem:  Suspected aspiration Pneumonia - Patient admitted and started empirically on Levaquin-day 2. Clinically improved, leukocytosis downtrending and now afebrile. -Will continue with current treatment plan, if continues to improve, suspect home on 8/14 - Per caregiver, has a long standing history of dysphagia and swallowing issues, so high suspicion for aspiration at this point. -Seen by speech therapy current recommendations for a dysphagia 2 diet, primary caregiver aware of risks of aspiration and is accepting all risks.  Active Problems: Dysphagia - Chronic issue, as long standing history of Huntington's disease. Essentially bedbound. -Seen by speech therapy current recommendations for a dysphagia 2 diet, primary caregiver aware of risks of aspiration and is accepting all risks.  Constipation - CT scan of the abdomen shows possible fecal impaction and significant stool burden, will give Fleet enema today. Will place on Senokot and MiraLax.  Hypothyroidism - Continue with levothyroxine  History of atrial fibrillation - Not anticoagulation candidate, rate controlled with digoxin.    Huntington's disease - On home hospice. DO NOT RESUSCITATE. - Continued on Haldol,Xanax, as needed Roxanol.  Disposition: Remain inpatient  DVT Prophylaxis: Prophylactic Lovenox   Code Status: DNR  Family Communication Primary caregiver at bedside  Procedures:  None3  CONSULTS:  None  Time spent 40 minutes-which includes 50% of the time with face-to-face with patient/ family and coordinating care related to the above assessment and  plan.    MEDICATIONS: Scheduled Meds: . antiseptic oral rinse  7 mL Mouth Rinse BID  . aspirin EC  81 mg Oral Daily  . Chlorhexidine Gluconate Cloth  6 each Topical Q0600  . digoxin  0.125 mg Oral Daily  . enoxaparin (LOVENOX) injection  40 mg Subcutaneous Daily  . FLUoxetine  20 mg Oral Daily  . haloperidol  0.5 mg Oral BID  . levofloxacin (LEVAQUIN) IV  750 mg Intravenous Daily  . levothyroxine  75 mcg Oral QAC breakfast  . mupirocin ointment  1 application Nasal BID   Continuous Infusions:  PRN Meds:.acetaminophen, acetaminophen, albuterol, ALPRAZolam, bisacodyl, meclizine, morphine CONCENTRATE, ondansetron (ZOFRAN) IV, ondansetron, polyethylene glycol, RESOURCE THICKENUP CLEAR, sodium phosphate  Antibiotics: Anti-infectives   Start     Dose/Rate Route Frequency Ordered Stop   03/08/14 0900  levofloxacin (LEVAQUIN) IVPB 750 mg     750 mg 100 mL/hr over 90 Minutes Intravenous Daily 03/08/14 0812 03/13/14 0959       PHYSICAL EXAM: Vital signs in last 24 hours: Filed Vitals:   03/08/14 1148 03/08/14 1339 03/08/14 2131 03/09/14 0649  BP:  119/73 116/53 105/58  Pulse: 84 91 91 88  Temp:  98.2 F (36.8 C) 99.5 F (37.5 C) 98.9 F (37.2 C)  TempSrc:  Oral Oral Oral  Resp:  18 18 18   Height:      Weight:      SpO2:  98% 97% 98%    Weight change:  Filed Weights   03/08/14 0813  Weight: 63.3 kg (139 lb 8.8 oz)   Body mass index is 23.22 kg/(m^2).   Gen Exam: Awake and alert-at bedside Neck: Supple, No JVD.   Chest: B/L Clear.   CVS: S1 S2 Regular, no murmurs.  Abdomen: soft, BS +, non tender, non distended.  Extremities: no edema, lower extremities warm to touch. Skin: No Rash.   Wounds: N/A.   Intake/Output from previous day:  Intake/Output Summary (Last 24 hours) at 03/09/14 0903 Last data filed at 03/09/14 0843  Gross per 24 hour  Intake 3376.67 ml  Output      0 ml  Net 3376.67 ml     LAB RESULTS: CBC  Recent Labs Lab 03/08/14 0308  03/09/14 0545  WBC 18.3* 14.5*  HGB 13.0 11.4*  HCT 39.8 35.7*  PLT 299 264  MCV 90.2 92.0  MCH 29.5 29.4  MCHC 32.7 31.9  RDW 14.1 14.7  LYMPHSABS 0.8  --   MONOABS 1.1*  --   EOSABS 0.0  --   BASOSABS 0.0  --     Chemistries   Recent Labs Lab 03/08/14 0308 03/09/14 0545  NA 140 141  K 3.4* 3.7  CL 99 107  CO2 24 21  GLUCOSE 133* 109*  BUN 14 7  CREATININE 0.71 0.62  CALCIUM 8.5 8.2*    CBG: No results found for this basename: GLUCAP,  in the last 168 hours  GFR Estimated Creatinine Clearance: 53 ml/min (by C-G formula based on Cr of 0.62).  Coagulation profile No results found for this basename: INR, PROTIME,  in the last 168 hours  Cardiac Enzymes  Recent Labs Lab 03/08/14 0305  TROPONINI <0.30    No components found with this basename: POCBNP,  No results found for this basename: DDIMER,  in the last 72 hours No results found for this basename: HGBA1C,  in the last 72 hours No results found for this basename: CHOL, HDL, LDLCALC, TRIG, CHOLHDL, LDLDIRECT,  in the last 72 hours No results found for this basename: TSH, T4TOTAL, FREET3, T3FREE, THYROIDAB,  in the last 72 hours No results found for this basename: VITAMINB12, FOLATE, FERRITIN, TIBC, IRON, RETICCTPCT,  in the last 72 hours  Recent Labs  03/08/14 0748  LIPASE 15    Urine Studies No results found for this basename: UACOL, UAPR, USPG, UPH, UTP, UGL, UKET, UBIL, UHGB, UNIT, UROB, ULEU, UEPI, UWBC, URBC, UBAC, CAST, CRYS, UCOM, BILUA,  in the last 72 hours  MICROBIOLOGY: Recent Results (from the past 240 hour(s))  CULTURE, BLOOD (ROUTINE X 2)     Status: None   Collection Time    03/08/14  3:20 AM      Result Value Ref Range Status   Specimen Description BLOOD RIGHT ARM   Final   Special Requests BOTTLES DRAWN AEROBIC AND ANAEROBIC 10CC EACH   Final   Culture  Setup Time     Final   Value: 03/08/2014 08:37     Performed at [facility]   Culture     Final   Value:         BLOOD CULTURE RECEIVED NO GROWTH TO DATE CULTURE WILL BE HELD FOR 5 DAYS BEFORE ISSUING A FINAL NEGATIVE REPORT     Performed at [facility]   Report Status PENDING   Incomplete  CULTURE, BLOOD (ROUTINE X 2)     Status: None   Collection Time    03/08/14  3:32 AM      Result Value Ref Range Status   Specimen Description BLOOD LEFT HAND   Final   Special Requests BOTTLES DRAWN AEROBIC ONLY 10CC   Final   Culture  Setup Time     Final   Value: 03/08/2014 08:37     Performed at [facility]  Culture     Final   Value:        BLOOD CULTURE RECEIVED NO GROWTH TO DATE CULTURE WILL BE HELD FOR 5 DAYS BEFORE ISSUING A FINAL NEGATIVE REPORT     Performed at [facility]   Report Status PENDING   Incomplete  MRSA PCR SCREENING     Status: Abnormal   Collection Time    03/08/14  8:30 AM      Result Value Ref Range Status   MRSA by PCR POSITIVE (*) NEGATIVE Final   Comment:            The GeneXpert MRSA Assay (FDA     approved for NASAL specimens     only), is one component of a     comprehensive MRSA colonization     surveillance program. It is not     intended to diagnose MRSA     infection nor to guide or     monitor treatment for     MRSA infections.     RESULT CALLED TO, READ BACK BY AND VERIFIED WITH:     R. L'ESPERANCE RN 10:20 03/08/14 ([email or web address])    RADIOLOGY STUDIES/RESULTS: Ct Abdomen Pelvis Wo Contrast  03/08/2014   CLINICAL DATA:  Possible free abdominal air  EXAM: CT ABDOMEN AND PELVIS WITHOUT CONTRAST  TECHNIQUE: Multidetector CT imaging of the abdomen and pelvis was performed following the standard protocol without IV contrast.  COMPARISON:  03/08/2014 and 09/23/2011  FINDINGS: There is patchy infiltrate in left lower lobe posteriorly. Patchy infiltrate is noted in right base anteromedially. Peribronchial thickening and small central infiltrate in right lower lobe.  Small hiatal hernia. The patient is status post cholecystectomy. Unenhanced liver shows  no biliary ductal dilatation. There is no evidence of free abdominal air. Abundant colonic stool. Atherosclerotic calcifications of abdominal aorta and iliac arteries. No pericecal inflammation. The terminal ileum is unremarkable.  There is no small bowel obstruction. There is moderate gas and abundant stool within rectum. The rectum measures at least 6.9 cm in diameter highly suspicious for fecal impaction. Moderate gas noted in sigmoid colon.  A cystic lesion in left adnexa measures 2.7 cm. Cystic lesion in right adnexa measures 2.2 cm.  Unenhanced kidneys are symmetrical in size. No nephrolithiasis. No calcified ureteral calculi.  Sagittal images of the spine shows disc space flattening with vacuum disc phenomenon at L5-S1 level.  IMPRESSION: 1. No free abdominal air is noted. 2. Moderate colonic stool. Abundant stool and gas noted within rectum suspicious for fecal impaction. 3. No nephrolithiasis.  No hydronephrosis or hydroureter. 4. No small bowel obstruction. 5. There is patchy infiltrate in left lower lobe posteriorly and right base anteromedially. Small amount of central infiltrate noted in right lower lobe. Findings suspicious for multifocal pneumonia.   Electronically Signed   By: Natasha Mead M.D.   On: 03/08/2014 19:38   Dg Chest Port 1 View  03/08/2014   CLINICAL DATA:  Fever.  EXAM: CHEST - 1 VIEW  COMPARISON:  None.  FINDINGS: The lungs are well-aerated. Left basilar airspace opacities raise concern for pneumonia. Mild right basilar airspace opacities osseous seen. Mild vascular congestion is noted. There is no evidence of pleural effusion or pneumothorax.  The cardiomediastinal silhouette is borderline normal in size. No acute osseous abnormalities are seen.  IMPRESSION: Bibasilar airspace opacities, left greater than right, raise concern for multifocal pneumonia.   Electronically Signed   By: Roanna Raider M.D.   On: 03/08/2014 03:47  Dg Abd Portable 2v  03/08/2014   ADDENDUM REPORT:  03/08/2014 09:40  ADDENDUM: These results were called by telephone at the time of interpretation on 03/08/2014 at 9:35 am to Dr. Osvaldo ShipperGOKUL KRISHNAN , who verbally acknowledged these results.   Electronically Signed   By: Bretta BangWilliam  Woodruff M.D.   On: 03/08/2014 09:40   03/08/2014   CLINICAL DATA:  Abdominal pain with nausea and vomiting  EXAM: PORTABLE ABDOMEN - 2 VIEW  COMPARISON:  September 30, 2011  FINDINGS: Supine and left lateral decubitus abdomen images were obtained. There is moderate stool in the colon and rectum. Overall the bowel gas pattern is unremarkable. On the decubitus view, there is a questionable small focus of pneumoperitoneum. It should be noted that a deep lung sulcus could cause this appearance, however. There are surgical clips in the right upper quadrant as well as in the pelvis.  IMPRESSION: Overall bowel gas pattern is unremarkable. Moderate stool. Question small focus of pneumoperitoneum versus an unusually deep lungs sulcus on the right causing in appearance suspicious for pneumoperitoneum. CT of the abdomen and pelvis is advised in this circumstance to exclude possible free air.  Electronically Signed: By: Bretta BangWilliam  Woodruff M.D. On: 03/08/2014 09:09    Jeoffrey MassedGHIMIRE,SHANKER, MD  [facility] Pager:[phone]  If 7PM-7AM, please contact night-coverage [email or web address] Password [email or web address] 03/09/2014, 9:03 AM   LOS: 1 day   **Disclaimer: This note may have been dictated with voice recognition software. Similar sounding words can inadvertently be transcribed and this note may contain transcription errors which may not have been corrected upon publication of note.**

## 2014-03-09 NOTE — Progress Notes (Signed)
Nutrition Brief Note  Chart reviewed. Pt receiving Hospice care at home with plans to return home with continued [facility] services.  No nutrition interventions warranted at this time.  Please consult as needed.   Joaquin CourtsKimberly Harris, RD, LDN, CNSC Pager [phone] After Hours Pager [phone]

## 2014-03-09 NOTE — Progress Notes (Addendum)
Speech Language Pathology Treatment:    Patient Details Name: Sydney [name] S Kuchera MRN: [id number] DOB: 10/08/1934 Today's Date: 03/09/2014 Time: [id number] SLP Time Calculation (min): 45 min  Assessment / Plan / Recommendation Clinical Impression  SLP provided f/u check of pt tolerance of nectar thick liquids and dys 2 solids. Pt again observed to have impaired lingual coordination of solids and poor ability to masticate and soften bolus. Pt with 3 overt choking episodes when given soft, cookie-like solids. Pt tolerated nectar thick liquids with controlled single sips to reduce, but not eliminate risk of aspiration. SLP provided demonstration, verbal cues and direction to pt caregiver for safe feeding techniques, oral care and aspiration precautions, thickening liquids, and preparing solid textures.Together, caregiver and therapist found that pt best tolerates controlled straw sips of thin liquids and moist, creamy solids. Will downgrade diet texture to dys 1 (puree) and provide written education to caregiver in preparation for d/c home. Pt may benefit from hole health SLP visit for carryover of techniques in a home setting.    HPI HPI: Sydney [name] S Templin is a 79 y.o. female with a past medical history of Huntington's disease, chronic atrial fibrillation, hypothyroidism, who is bedbound.Patient usually lives at home with her caregiver, Eugenio HoesSue Andrews, who is also her power of attorney. Apparently, after she was given her supper patient started having nausea, followed by vomiting. When EMS arrived at the scene patient was found to be hypoxic. Her saturations were in the 80s. According to the caregiver for the past month or so patient has been running low-grade fevers. She has been taken to her primary care physician. Urine was checked and there was no evidence for UTI. She also has been having some evidence of aspiration. Apparently she has spent a few days at hospice home. It was felt that her chronic disease was  progressing.   Pertinent Vitals Pain Assessment: No/denies pain  SLP Plan  Continue with current plan of care    Recommendations Diet recommendations: Dysphagia 1 (puree);Nectar-thick liquid Liquids provided via: Straw Medication Administration: Whole meds with puree, crush if pt choking with meds Supervision: Trained caregiver to feed patient Compensations: Slow rate;Small sips/bites Postural Changes and/or Swallow Maneuvers: Seated upright 90 degrees;Out of bed for meals              Oral Care Recommendations: Oral care BID Follow up Recommendations: 24 hour supervision/assistance/ home health SLP for f/u education Plan: Continue with current plan of care    GO    Harlon DittyBonnie Jakaden Ouzts, MA CCC-SLP [phone]  Claudine MoutonDeBlois, Bellamie Turney Caroline 03/09/2014, 3:59 PM

## 2014-03-10 LAB — CBC
HCT: 36.8 % (ref 36.0–46.0)
HEMOGLOBIN: 12.1 g/dL (ref 12.0–15.0)
MCH: 29.7 pg (ref 26.0–34.0)
MCHC: 32.9 g/dL (ref 30.0–36.0)
MCV: 90.2 fL (ref 78.0–100.0)
Platelets: 290 10*3/uL (ref 150–400)
RBC: 4.08 MIL/uL (ref 3.87–5.11)
RDW: 14.2 % (ref 11.5–15.5)
WBC: 10.6 10*3/uL — ABNORMAL HIGH (ref 4.0–10.5)

## 2014-03-10 LAB — LEGIONELLA ANTIGEN, URINE: Legionella Antigen, Urine: NEGATIVE

## 2014-03-10 MED ORDER — POLYETHYLENE GLYCOL 3350 17 G PO PACK: 17.0000 g | PACK | Freq: Every day | ORAL | Status: DC

## 2014-03-10 MED ORDER — LEVOFLOXACIN 750 MG PO TABS: 750.0000 mg | ORAL_TABLET | Freq: Every day | ORAL | Status: DC

## 2014-03-10 MED ORDER — SENNOSIDES-DOCUSATE SODIUM 8.6-50 MG PO TABS: 2.0000 | ORAL_TABLET | Freq: Every day | ORAL | Status: DC

## 2014-03-10 MED ORDER — LEVOFLOXACIN 750 MG PO TABS
750.0000 mg | ORAL_TABLET | Freq: Every day | ORAL | Status: DC
  Administered 2014-03-10: 750 mg via ORAL
  Filled 2014-03-10: qty 1

## 2014-03-10 NOTE — Discharge Summary (Signed)
PATIENT DETAILS Name: Sydney [name] Age: 79 y.o. Sex: female Date of Birth: 05/26/1935 MRN: [id number]. Admit Date: 03/08/2014 Admitting Physician: Eduard ClosArshad N Kakrakandy, MD AOZ:[id number],NGEPCP:STONEKING,HAL Maisie FusHOMAS, MD  Recommendations for Outpatient Follow-up:  1. Optimize comfort medications, continue Levaquin for 4 more days from 8/14. 2. Currently on dysphagia 1 diet, continue with strict aspiration precautions.  PRIMARY DISCHARGE DIAGNOSIS:  Principal Problem:   Pneumonia Active Problems:   Aspiration pneumonia   Huntington's disease   Campath-induced atrial fibrillation   Hypothyroidism      PAST MEDICAL HISTORY: Past Medical History  Diagnosis Date  . Huntington disease   . Atrial fibrillation   . Chronic respiratory failure     nocturnal oxygen  . Complication of anesthesia     felt surgery with epidural  . GERD (gastroesophageal reflux disease)   . Thyroid disease     DISCHARGE MEDICATIONS:   Medication List         acetaminophen 325 MG tablet  Commonly known as:  TYLENOL  Take 650 mg by mouth every 4 (four) hours as needed (pain).     ALPRAZolam 0.25 MG tablet  Commonly known as:  XANAX  Take 0.25-0.5 mg by mouth 2 (two) times daily as needed for anxiety (anxiety). take 1tab 0.25mg  qdprn and 2tablets (0.5mg )qhsprn     AMBULATORY NON FORMULARY MEDICATION  - Morphine Sul Sol 100/745ml  - Sig: Administer 0.5925ml every 2 hours as needed for pain/ dyspnea; Administer 0.225ml every 2 hours as needed for dyspnea/pain     aspirin EC 81 MG tablet  Take 81 mg by mouth daily.     atropine 1 % ophthalmic ointment  Place 1 application under the tongue 3 (three) times daily. 3-4 drops under tongue for excessive secretions     bisacodyl 10 MG suppository  Commonly known as:  DULCOLAX  Place 10 mg rectally as needed for moderate constipation (constipation).     clonazePAM 0.5 MG tablet  Commonly known as:  KLONOPIN  Take 1 mg by mouth at bedtime.     digoxin 0.25 MG tablet    Commonly known as:  LANOXIN  Take 0.125 mcg by mouth daily. Takes half a tablet     estrogens (conjugated) 0.3 MG tablet  Commonly known as:  PREMARIN  Take 0.3 mg by mouth daily. Take daily for 21 days then do not take for 7 days.     feeding supplement (ENSURE COMPLETE) Liqd  Take 120 mLs by mouth 2 (two) times daily between meals.     FLUoxetine 20 MG capsule  Commonly known as:  PROZAC  Take 20 mg by mouth daily.     haloperidol 0.5 MG tablet  Commonly known as:  HALDOL  Take 0.5 mg by mouth 2 (two) times daily.     ibuprofen 800 MG tablet  Commonly known as:  ADVIL,MOTRIN  Take 800 mg by mouth every 8 (eight) hours as needed. For pain     levofloxacin 750 MG tablet  Commonly known as:  LEVAQUIN  Take 1 tablet (750 mg total) by mouth daily.     levothyroxine 75 MCG tablet  Commonly known as:  SYNTHROID, LEVOTHROID  Take 75 mcg by mouth daily before breakfast.     loperamide 2 MG tablet  Commonly known as:  IMODIUM A-D  Take 2 mg by mouth 4 (four) times daily as needed for diarrhea or loose stools (loose stools).     meclizine 25 MG tablet  Commonly known as:  ANTIVERT  Take 25 mg by mouth 3 (three) times daily as needed. For dizziness/nausea.     morphine 20 MG/ML concentrated solution  Commonly known as:  ROXANOL  Take 5-10 mg by mouth every 2 (two) hours as needed for severe pain (dyspnea/pain).     nitroGLYCERIN 0.4 MG SL tablet  Commonly known as:  NITROSTAT  Place 0.4 mg under the tongue every 5 (five) minutes x 3 doses as needed.     omeprazole 20 MG capsule  Commonly known as:  PRILOSEC  Take 20 mg by mouth daily.     ondansetron 8 MG disintegrating tablet  Commonly known as:  ZOFRAN-ODT  Take 1 tablet (8 mg total) by mouth every 8 (eight) hours as needed for nausea. For nausea.     polyethylene glycol packet  Commonly known as:  MIRALAX / GLYCOLAX  Take 17 g by mouth daily. For constipation.     promethazine 25 MG suppository  Commonly known  as:  PHENERGAN  Place 25 mg rectally every 8 (eight) hours as needed. For nausea.     senna-docusate 8.6-50 MG per tablet  Commonly known as:  Senokot-S  Take 2 tablets by mouth at bedtime.        ALLERGIES:   Allergies  Allergen Reactions  . Codeine Itching  . Penicillins Cross Reactors Hives    BRIEF HPI:  See H&P, Labs, Consult and Test reports for all details in brief, patient was admitted for evaluation of fever along with nausea and vomiting. She was found to have pneumonia and admitted for further evaluation and treatment  CONSULTATIONS:   None  PERTINENT RADIOLOGIC STUDIES: Ct Abdomen Pelvis Wo Contrast  03/08/2014   CLINICAL DATA:  Possible free abdominal air  EXAM: CT ABDOMEN AND PELVIS WITHOUT CONTRAST  TECHNIQUE: Multidetector CT imaging of the abdomen and pelvis was performed following the standard protocol without IV contrast.  COMPARISON:  03/08/2014 and 09/23/2011  FINDINGS: There is patchy infiltrate in left lower lobe posteriorly. Patchy infiltrate is noted in right base anteromedially. Peribronchial thickening and small central infiltrate in right lower lobe.  Small hiatal hernia. The patient is status post cholecystectomy. Unenhanced liver shows no biliary ductal dilatation. There is no evidence of free abdominal air. Abundant colonic stool. Atherosclerotic calcifications of abdominal aorta and iliac arteries. No pericecal inflammation. The terminal ileum is unremarkable.  There is no small bowel obstruction. There is moderate gas and abundant stool within rectum. The rectum measures at least 6.9 cm in diameter highly suspicious for fecal impaction. Moderate gas noted in sigmoid colon.  A cystic lesion in left adnexa measures 2.7 cm. Cystic lesion in right adnexa measures 2.2 cm.  Unenhanced kidneys are symmetrical in size. No nephrolithiasis. No calcified ureteral calculi.  Sagittal images of the spine shows disc space flattening with vacuum disc phenomenon at L5-S1  level.  IMPRESSION: 1. No free abdominal air is noted. 2. Moderate colonic stool. Abundant stool and gas noted within rectum suspicious for fecal impaction. 3. No nephrolithiasis.  No hydronephrosis or hydroureter. 4. No small bowel obstruction. 5. There is patchy infiltrate in left lower lobe posteriorly and right base anteromedially. Small amount of central infiltrate noted in right lower lobe. Findings suspicious for multifocal pneumonia.   Electronically Signed   By: Natasha Mead M.D.   On: 03/08/2014 19:38   Dg Chest Port 1 View  03/08/2014   CLINICAL DATA:  Fever.  EXAM: CHEST - 1 VIEW  COMPARISON:  None.  FINDINGS: The lungs are  well-aerated. Left basilar airspace opacities raise concern for pneumonia. Mild right basilar airspace opacities osseous seen. Mild vascular congestion is noted. There is no evidence of pleural effusion or pneumothorax.  The cardiomediastinal silhouette is borderline normal in size. No acute osseous abnormalities are seen.  IMPRESSION: Bibasilar airspace opacities, left greater than right, raise concern for multifocal pneumonia.   Electronically Signed   By: Roanna Raider M.D.   On: 03/08/2014 03:47   Dg Abd Portable 2v  03/08/2014   ADDENDUM REPORT: 03/08/2014 09:40  ADDENDUM: These results were called by telephone at the time of interpretation on 03/08/2014 at 9:35 am to Dr. Osvaldo Shipper , who verbally acknowledged these results.   Electronically Signed   By: Bretta Bang M.D.   On: 03/08/2014 09:40   03/08/2014   CLINICAL DATA:  Abdominal pain with nausea and vomiting  EXAM: PORTABLE ABDOMEN - 2 VIEW  COMPARISON:  September 30, 2011  FINDINGS: Supine and left lateral decubitus abdomen images were obtained. There is moderate stool in the colon and rectum. Overall the bowel gas pattern is unremarkable. On the decubitus view, there is a questionable small focus of pneumoperitoneum. It should be noted that a deep lung sulcus could cause this appearance, however. There are  surgical clips in the right upper quadrant as well as in the pelvis.  IMPRESSION: Overall bowel gas pattern is unremarkable. Moderate stool. Question small focus of pneumoperitoneum versus an unusually deep lungs sulcus on the right causing in appearance suspicious for pneumoperitoneum. CT of the abdomen and pelvis is advised in this circumstance to exclude possible free air.  Electronically Signed: By: Bretta Bang M.D. On: 03/08/2014 09:09     PERTINENT LAB RESULTS: CBC:  Recent Labs  03/09/14 0545 03/10/14 0757  WBC 14.5* 10.6*  HGB 11.4* 12.1  HCT 35.7* 36.8  PLT 264 290   CMET CMP     Component Value Date/Time   NA 141 03/09/2014 0545   K 3.7 03/09/2014 0545   CL 107 03/09/2014 0545   CO2 21 03/09/2014 0545   GLUCOSE 109* 03/09/2014 0545   BUN 7 03/09/2014 0545   CREATININE 0.62 03/09/2014 0545   CALCIUM 8.2* 03/09/2014 0545   PROT 5.7* 03/09/2014 0545   ALBUMIN 2.2* 03/09/2014 0545   AST 8 03/09/2014 0545   ALT 6 03/09/2014 0545   ALKPHOS 88 03/09/2014 0545   BILITOT 0.3 03/09/2014 0545   GFRNONAA 85* 03/09/2014 0545   GFRAA >90 03/09/2014 0545    GFR Estimated Creatinine Clearance: 53 ml/min (by C-G formula based on Cr of 0.62).  Recent Labs  03/08/14 0748  LIPASE 15    Recent Labs  03/08/14 0305  TROPONINI <0.30   No components found with this basename: POCBNP,  No results found for this basename: DDIMER,  in the last 72 hours No results found for this basename: HGBA1C,  in the last 72 hours No results found for this basename: CHOL, HDL, LDLCALC, TRIG, CHOLHDL, LDLDIRECT,  in the last 72 hours No results found for this basename: TSH, T4TOTAL, FREET3, T3FREE, THYROIDAB,  in the last 72 hours No results found for this basename: VITAMINB12, FOLATE, FERRITIN, TIBC, IRON, RETICCTPCT,  in the last 72 hours Coags: No results found for this basename: PT, INR,  in the last 72 hours Microbiology: Recent Results (from the past 240 hour(s))  CULTURE, BLOOD (ROUTINE X 2)      Status: None   Collection Time    03/08/14  3:20 AM  Result Value Ref Range Status   Specimen Description BLOOD RIGHT ARM   Final   Special Requests BOTTLES DRAWN AEROBIC AND ANAEROBIC 10CC EACH   Final   Culture  Setup Time     Final   Value: 03/08/2014 08:37     Performed at [facility]   Culture     Final   Value:        BLOOD CULTURE RECEIVED NO GROWTH TO DATE CULTURE WILL BE HELD FOR 5 DAYS BEFORE ISSUING A FINAL NEGATIVE REPORT     Performed at [facility]   Report Status PENDING   Incomplete  CULTURE, BLOOD (ROUTINE X 2)     Status: None   Collection Time    03/08/14  3:32 AM      Result Value Ref Range Status   Specimen Description BLOOD LEFT HAND   Final   Special Requests BOTTLES DRAWN AEROBIC ONLY 10CC   Final   Culture  Setup Time     Final   Value: 03/08/2014 08:37     Performed at [facility]   Culture     Final   Value:        BLOOD CULTURE RECEIVED NO GROWTH TO DATE CULTURE WILL BE HELD FOR 5 DAYS BEFORE ISSUING A FINAL NEGATIVE REPORT     Performed at [facility]   Report Status PENDING   Incomplete  MRSA PCR SCREENING     Status: Abnormal   Collection Time    03/08/14  8:30 AM      Result Value Ref Range Status   MRSA by PCR POSITIVE (*) NEGATIVE Final   Comment:            The GeneXpert MRSA Assay (FDA     approved for NASAL specimens     only), is one component of a     comprehensive MRSA colonization     surveillance program. It is not     intended to diagnose MRSA     infection nor to guide or     monitor treatment for     MRSA infections.     RESULT CALLED TO, READ BACK BY AND VERIFIED WITH:     R. L'ESPERANCE RN 10:20 03/08/14 ([email or web address])     BRIEF HOSPITAL COURSE:  Suspected aspiration Pneumonia  - Patient admitted and started empirically on Levaquin- currently day 3. Clinically improved, leukocytosis has resolved, now afebrile. Since clinically improved, we'll transition to oral Levaquin for 4 more  days. She is stable to be discharged home. She is being followed by hospice at home, which will be continued. - Per caregiver, has a long standing history of dysphagia and swallowing issues, so high suspicion for aspiration at this point. -Seen by speech therapy current recommendations for a dysphagia 1 diet, primary caregiver aware of risks of aspiration and is accepting all risks.   Active Problems:  Dysphagia  - Chronic issue, as long standing history of Huntington's disease. Essentially bedbound.  -Seen by speech therapy current recommendations for a dysphagia 1 diet, primary caregiver aware of risks of aspiration and is accepting all risks.   Constipation  - CT scan of the abdomen shows possible fecal impaction and significant stool burden,  Given fleet enema with good results, now on Senokot and MiraLax-will continue on discharge  Hypothyroidism  - Continue with levothyroxine   History of atrial fibrillation  - Not anticoagulation candidate, rate controlled with digoxin.   Huntington's disease  -  On home hospice. DO NOT RESUSCITATE.  - Continued on Haldol,Xanax, as needed Roxanol.  TODAY-DAY OF DISCHARGE:  Subjective:   Sydney [name] today has no fever and is stable since admission  Objective:   Blood pressure 129/79, pulse 79, temperature 98.9 F (37.2 C), temperature source Oral, resp. rate 18, height 5\' 5"  (1.651 m), weight 63.3 kg (139 lb 8.8 oz), SpO2 94.00%.  Intake/Output Summary (Last 24 hours) at 03/10/14 1001 Last data filed at 03/10/14 0454  Gross per 24 hour  Intake   1040 ml  Output      0 ml  Net   1040 ml   Filed Weights   03/08/14 0813  Weight: 63.3 kg (139 lb 8.8 oz)    Exam Awake Alert [place].AT,PERRAL Supple Neck,No JVD, No cervical lymphadenopathy appriciated.  Symmetrical Chest wall movement, Good air movement bilaterally, CTAB RRR,No Gallops,Rubs or new Murmurs, No Parasternal Heave +ve B.Sounds, Abd Soft, Non tender, No organomegaly  appriciated, No rebound -guarding or rigidity. No Cyanosis, Clubbing or edema, No new Rash or bruise  DISCHARGE CONDITION: Stable  DISPOSITION: Home with home Hospice  DISCHARGE INSTRUCTIONS:    Activity:  As tolerated   Diet recommendation: Dysphagia 1 diet  Discharge Instructions   Call MD for:  difficulty breathing, headache or visual disturbances    Complete by:  As directed      Diet - low sodium heart healthy    Complete by:  As directed   Dysphagia 1 diet     Increase activity slowly    Complete by:  As directed            Follow-up Information   Follow up with Ginette Otto, MD. Schedule an appointment as soon as possible for a visit in 1 week.   Specialty:  Internal Medicine   Contact information:   [address] [place] [address] [phone]        Total Time spent on discharge equals 45 minutes.  SignedJeoffrey Massed 03/10/2014 10:01 AM  **Disclaimer: This note may have been dictated with voice recognition software. Similar sounding words can inadvertently be transcribed and this note may contain transcription errors which may not have been corrected upon publication of note.**

## 2014-03-10 NOTE — Progress Notes (Signed)
Inpatient RN visit- Sydney [name]  [facility]-Karen Merilynn FinlandRobertson RN [facility].  Pt is DNR code.  Pt alert & oriented, lying in bed interactive with writer and CG Fannie KneeSue, who was present. Pt to be discharged home today via non emergent transport. CMRN Deborah notified of need for OOF DNR to accompany pt. No concerns voiced from Cg at time of visit. Emotional support offered. Patient's home medication list and transfer summary in place  on shadow chart.   Please call [facility] @ [phone]-with any hospice needs.   Thank you. Hansel StarlingKaren E. Robertson, RN  [facility]  Hospice Liaison  [phone])

## 2014-03-10 NOTE — Progress Notes (Signed)
Patient discharge teaching given, including activity, diet, follow-up appoints, and medications. Patient caregiver verbalized understanding of all discharge instructions. IV access was d/c'd. Vitals are stable. Skin is intact except as charted in most recent assessments. Pt to be escorted out by EMT, to be driven home by [facility].

## 2014-03-10 NOTE — Clinical Social Work Note (Signed)
Per MD patient ready to DC Home. RN, patient/family, and facility notified of patient's DC. DC packet on patient's chart. Ambulance transport requested for patient. Address confirmed with Damian LeavellKathy Andrews. CSW signing off at this time.   Roddie McBryant Gisselle Galvis MSW, [place]WA, [place], [phone]

## 2014-03-14 LAB — CULTURE, BLOOD (ROUTINE X 2)
CULTURE: NO GROWTH
Culture: NO GROWTH

## 2014-04-06 ENCOUNTER — Emergency Department (HOSPITAL_COMMUNITY)

## 2014-04-06 ENCOUNTER — Inpatient Hospital Stay (HOSPITAL_COMMUNITY)
Admission: EM | Admit: 2014-04-06 | Discharge: 2014-04-10 | DRG: 193 | Disposition: A | Discharge status: Hospice | Attending: Internal Medicine | Admitting: Internal Medicine

## 2014-04-06 ENCOUNTER — Encounter (HOSPITAL_COMMUNITY): Payer: Self-pay | Admitting: Emergency Medicine

## 2014-04-06 DIAGNOSIS — E039 Hypothyroidism, unspecified: Secondary | ICD-10-CM | POA: Diagnosis present

## 2014-04-06 DIAGNOSIS — Z7982 Long term (current) use of aspirin: Secondary | ICD-10-CM

## 2014-04-06 DIAGNOSIS — J189 Pneumonia, unspecified organism: Secondary | ICD-10-CM | POA: Diagnosis not present

## 2014-04-06 DIAGNOSIS — R627 Adult failure to thrive: Secondary | ICD-10-CM | POA: Diagnosis present

## 2014-04-06 DIAGNOSIS — Z66 Do not resuscitate: Secondary | ICD-10-CM | POA: Diagnosis present

## 2014-04-06 DIAGNOSIS — Z79899 Other long term (current) drug therapy: Secondary | ICD-10-CM

## 2014-04-06 DIAGNOSIS — IMO0002 Reserved for concepts with insufficient information to code with codable children: Secondary | ICD-10-CM

## 2014-04-06 DIAGNOSIS — Z515 Encounter for palliative care: Secondary | ICD-10-CM

## 2014-04-06 DIAGNOSIS — Z87891 Personal history of nicotine dependence: Secondary | ICD-10-CM

## 2014-04-06 DIAGNOSIS — I4891 Unspecified atrial fibrillation: Secondary | ICD-10-CM | POA: Diagnosis present

## 2014-04-06 DIAGNOSIS — G10 Huntington's disease: Secondary | ICD-10-CM | POA: Diagnosis present

## 2014-04-06 DIAGNOSIS — E43 Unspecified severe protein-calorie malnutrition: Secondary | ICD-10-CM | POA: Diagnosis present

## 2014-04-06 DIAGNOSIS — E440 Moderate protein-calorie malnutrition: Secondary | ICD-10-CM | POA: Insufficient documentation

## 2014-04-06 DIAGNOSIS — R5381 Other malaise: Secondary | ICD-10-CM | POA: Diagnosis not present

## 2014-04-06 DIAGNOSIS — K219 Gastro-esophageal reflux disease without esophagitis: Secondary | ICD-10-CM | POA: Diagnosis present

## 2014-04-06 DIAGNOSIS — R5383 Other fatigue: Secondary | ICD-10-CM | POA: Diagnosis not present

## 2014-04-06 DIAGNOSIS — J9610 Chronic respiratory failure, unspecified whether with hypoxia or hypercapnia: Secondary | ICD-10-CM | POA: Diagnosis present

## 2014-04-06 DIAGNOSIS — Z9981 Dependence on supplemental oxygen: Secondary | ICD-10-CM

## 2014-04-06 HISTORY — DX: Pneumonia, unspecified organism: J18.9

## 2014-04-06 LAB — GRAM STAIN

## 2014-04-06 LAB — COMPREHENSIVE METABOLIC PANEL
ALT: <5 U/L (ref 0–35)
ANION GAP: 13 (ref 5–15)
AST: 9 U/L (ref 0–37)
Albumin: 2.8 g/dL — ABNORMAL LOW (ref 3.5–5.2)
Alkaline Phosphatase: 101 U/L (ref 39–117)
BILIRUBIN TOTAL: 0.2 mg/dL — AB (ref 0.3–1.2)
BUN: 16 mg/dL (ref 6–23)
CHLORIDE: 103 meq/L (ref 96–112)
CO2: 25 meq/L (ref 19–32)
CREATININE: 0.68 mg/dL (ref 0.50–1.10)
Calcium: 8.6 mg/dL (ref 8.4–10.5)
GFR, EST NON AFRICAN AMERICAN: 82 mL/min — AB (ref >90)
GLUCOSE: 80 mg/dL (ref 70–99)
Potassium: 3.9 mEq/L (ref 3.7–5.3)
Sodium: 141 mEq/L (ref 137–147)
Total Protein: 6.8 g/dL (ref 6.0–8.3)

## 2014-04-06 LAB — PRO B NATRIURETIC PEPTIDE: PRO B NATRI PEPTIDE: 163 pg/mL (ref 0–450)

## 2014-04-06 LAB — URINALYSIS, ROUTINE W REFLEX MICROSCOPIC
BILIRUBIN URINE: NEGATIVE
Glucose, UA: NEGATIVE mg/dL
KETONES UR: NEGATIVE mg/dL
LEUKOCYTES UA: NEGATIVE
NITRITE: NEGATIVE
PROTEIN: NEGATIVE mg/dL
Specific Gravity, Urine: 1.024 (ref 1.005–1.030)
UROBILINOGEN UA: 0.2 mg/dL (ref 0.0–1.0)
pH: 6 (ref 5.0–8.0)

## 2014-04-06 LAB — CBC
HEMATOCRIT: 37.3 % (ref 36.0–46.0)
Hemoglobin: 12.4 g/dL (ref 12.0–15.0)
MCH: 29.3 pg (ref 26.0–34.0)
MCHC: 33.2 g/dL (ref 30.0–36.0)
MCV: 88.2 fL (ref 78.0–100.0)
Platelets: 232 10*3/uL (ref 150–400)
RBC: 4.23 MIL/uL (ref 3.87–5.11)
RDW: 13.8 % (ref 11.5–15.5)
WBC: 9 10*3/uL (ref 4.0–10.5)

## 2014-04-06 LAB — CREATININE, SERUM
CREATININE: 0.56 mg/dL (ref 0.50–1.10)
GFR calc Af Amer: >90 mL/min (ref >90)
GFR calc non Af Amer: 88 mL/min — ABNORMAL LOW (ref >90)

## 2014-04-06 LAB — STREP PNEUMONIAE URINARY ANTIGEN: STREP PNEUMO URINARY ANTIGEN: NEGATIVE

## 2014-04-06 LAB — URINE MICROSCOPIC-ADD ON

## 2014-04-06 LAB — CBC WITH DIFFERENTIAL/PLATELET
BASOS ABS: 0 10*3/uL (ref 0.0–0.1)
Basophils Relative: 0 % (ref 0–1)
EOS PCT: 3 % (ref 0–5)
Eosinophils Absolute: 0.3 10*3/uL (ref 0.0–0.7)
HEMATOCRIT: 39.9 % (ref 36.0–46.0)
HEMOGLOBIN: 13.1 g/dL (ref 12.0–15.0)
LYMPHS PCT: 26 % (ref 12–46)
Lymphs Abs: 2.4 10*3/uL (ref 0.7–4.0)
MCH: 29.1 pg (ref 26.0–34.0)
MCHC: 32.8 g/dL (ref 30.0–36.0)
MCV: 88.7 fL (ref 78.0–100.0)
MONO ABS: 0.9 10*3/uL (ref 0.1–1.0)
MONOS PCT: 10 % (ref 3–12)
Neutro Abs: 5.8 10*3/uL (ref 1.7–7.7)
Neutrophils Relative %: 61 % (ref 43–77)
Platelets: 251 10*3/uL (ref 150–400)
RBC: 4.5 MIL/uL (ref 3.87–5.11)
RDW: 13.8 % (ref 11.5–15.5)
WBC: 9.4 10*3/uL (ref 4.0–10.5)

## 2014-04-06 LAB — I-STAT CG4 LACTIC ACID, ED: Lactic Acid, Venous: 0.88 mmol/L (ref 0.5–2.2)

## 2014-04-06 MED ORDER — DEXTROSE 5 % IV SOLN: 1.0000 g | Freq: Three times a day (TID) | INTRAVENOUS | Status: DC

## 2014-04-06 MED ORDER — LEVOTHYROXINE SODIUM 75 MCG PO TABS
75.0000 ug | ORAL_TABLET | Freq: Every day | ORAL | Status: DC
  Administered 2014-04-07 – 2014-04-10 (×4): 75 ug via ORAL
  Filled 2014-04-06 (×6): qty 1

## 2014-04-06 MED ORDER — VANCOMYCIN HCL 500 MG IV SOLR
500.0000 mg | Freq: Two times a day (BID) | INTRAVENOUS | Status: DC
  Administered 2014-04-06 – 2014-04-10 (×8): 500 mg via INTRAVENOUS
  Filled 2014-04-06 (×11): qty 500

## 2014-04-06 MED ORDER — PROMETHAZINE HCL 25 MG RE SUPP: 25.0000 mg | Freq: Three times a day (TID) | RECTAL | Status: DC | PRN

## 2014-04-06 MED ORDER — NON FORMULARY: 0.2500 mL | Status: DC | PRN

## 2014-04-06 MED ORDER — IBUPROFEN 800 MG PO TABS: 800.0000 mg | ORAL_TABLET | Freq: Three times a day (TID) | ORAL | Status: DC | PRN

## 2014-04-06 MED ORDER — ATROPINE SULFATE 1 % OP SOLN
3.0000 [drp] | Freq: Three times a day (TID) | OPHTHALMIC | Status: DC
  Administered 2014-04-07 – 2014-04-08 (×2): 3 [drp] via SUBLINGUAL
  Administered 2014-04-09: 4 [drp] via SUBLINGUAL
  Administered 2014-04-09 – 2014-04-10 (×2): 3 [drp] via SUBLINGUAL
  Filled 2014-04-06: qty 2

## 2014-04-06 MED ORDER — ACETAMINOPHEN 325 MG PO TABS: 650.0000 mg | ORAL_TABLET | ORAL | Status: DC | PRN

## 2014-04-06 MED ORDER — MORPHINE SULFATE (CONCENTRATE) 10 MG /0.5 ML PO SOLN: 5.0000 mg | ORAL | Status: DC | PRN

## 2014-04-06 MED ORDER — ATROPINE SULFATE 1 % OP OINT: 1.0000 "application " | TOPICAL_OINTMENT | Freq: Three times a day (TID) | OPHTHALMIC | Status: DC

## 2014-04-06 MED ORDER — SODIUM CHLORIDE 0.9 % IV SOLN
INTRAVENOUS | Status: DC
  Administered 2014-04-06 – 2014-04-09 (×3): via INTRAVENOUS

## 2014-04-06 MED ORDER — ESTROGENS CONJUGATED 0.3 MG PO TABS: 0.3000 mg | ORAL_TABLET | Freq: Every day | ORAL | Status: DC

## 2014-04-06 MED ORDER — ASPIRIN EC 81 MG PO TBEC
81.0000 mg | DELAYED_RELEASE_TABLET | Freq: Every day | ORAL | Status: DC
  Administered 2014-04-07 – 2014-04-10 (×4): 81 mg via ORAL
  Filled 2014-04-06 (×4): qty 1

## 2014-04-06 MED ORDER — ENSURE COMPLETE PO LIQD
120.0000 mL | Freq: Two times a day (BID) | ORAL | Status: DC
  Administered 2014-04-09 – 2014-04-10 (×3): 120 mL via ORAL

## 2014-04-06 MED ORDER — NITROGLYCERIN 0.4 MG SL SUBL: 0.4000 mg | SUBLINGUAL_TABLET | SUBLINGUAL | Status: DC | PRN

## 2014-04-06 MED ORDER — DEXTROSE 5 % IV SOLN
1.0000 g | Freq: Once | INTRAVENOUS | Status: AC
  Administered 2014-04-06: 1 g via INTRAVENOUS
  Filled 2014-04-06: qty 1

## 2014-04-06 MED ORDER — CLONAZEPAM 1 MG PO TABS
1.0000 mg | ORAL_TABLET | Freq: Every day | ORAL | Status: DC
  Administered 2014-04-07 – 2014-04-09 (×3): 1 mg via ORAL
  Filled 2014-04-06 (×3): qty 1

## 2014-04-06 MED ORDER — SODIUM CHLORIDE 0.9 % IV SOLN
Freq: Once | INTRAVENOUS | Status: AC
  Administered 2014-04-06: 19:00:00 via INTRAVENOUS

## 2014-04-06 MED ORDER — DIGOXIN 125 MCG PO TABS
0.1250 mg | ORAL_TABLET | Freq: Every day | ORAL | Status: DC
  Administered 2014-04-07 – 2014-04-10 (×4): 0.125 mg via ORAL
  Filled 2014-04-06 (×4): qty 1

## 2014-04-06 MED ORDER — LOPERAMIDE HCL 2 MG PO CAPS: 2.0000 mg | ORAL_CAPSULE | Freq: Four times a day (QID) | ORAL | Status: DC | PRN

## 2014-04-06 MED ORDER — FLUOXETINE HCL 20 MG PO CAPS
20.0000 mg | ORAL_CAPSULE | Freq: Every day | ORAL | Status: DC
  Administered 2014-04-07 – 2014-04-10 (×4): 20 mg via ORAL
  Filled 2014-04-06 (×4): qty 1

## 2014-04-06 MED ORDER — ONDANSETRON 8 MG PO TBDP
8.0000 mg | ORAL_TABLET | Freq: Three times a day (TID) | ORAL | Status: DC | PRN
  Filled 2014-04-06: qty 1

## 2014-04-06 MED ORDER — SENNOSIDES-DOCUSATE SODIUM 8.6-50 MG PO TABS
2.0000 | ORAL_TABLET | Freq: Every day | ORAL | Status: DC
  Administered 2014-04-07 – 2014-04-09 (×3): 2 via ORAL
  Filled 2014-04-06 (×3): qty 2

## 2014-04-06 MED ORDER — PANTOPRAZOLE SODIUM 40 MG PO TBEC
40.0000 mg | DELAYED_RELEASE_TABLET | Freq: Every day | ORAL | Status: DC
  Administered 2014-04-07 – 2014-04-10 (×4): 40 mg via ORAL
  Filled 2014-04-06 (×4): qty 1

## 2014-04-06 MED ORDER — MORPHINE SULFATE (CONCENTRATE) 20 MG/ML PO SOLN: 5.0000 mg | ORAL | Status: DC | PRN

## 2014-04-06 MED ORDER — HEPARIN SODIUM (PORCINE) 5000 UNIT/ML IJ SOLN
5000.0000 [IU] | Freq: Three times a day (TID) | INTRAMUSCULAR | Status: DC
  Administered 2014-04-07 – 2014-04-10 (×11): 5000 [IU] via SUBCUTANEOUS
  Filled 2014-04-06 (×15): qty 1

## 2014-04-06 MED ORDER — HALOPERIDOL 0.5 MG PO TABS
0.5000 mg | ORAL_TABLET | Freq: Two times a day (BID) | ORAL | Status: DC
  Administered 2014-04-07 – 2014-04-10 (×7): 0.5 mg via ORAL
  Filled 2014-04-06 (×9): qty 1

## 2014-04-06 MED ORDER — CEFEPIME HCL 1 G IJ SOLR
1.0000 g | INTRAMUSCULAR | Status: DC
  Filled 2014-04-06: qty 1

## 2014-04-06 MED ORDER — ALPRAZOLAM 0.5 MG PO TABS: 0.2500 mg | ORAL_TABLET | Freq: Two times a day (BID) | ORAL | Status: DC | PRN

## 2014-04-06 MED ORDER — MECLIZINE HCL 25 MG PO TABS
25.0000 mg | ORAL_TABLET | Freq: Three times a day (TID) | ORAL | Status: DC | PRN
  Filled 2014-04-06: qty 1

## 2014-04-06 NOTE — ED Provider Notes (Signed)
CSN: [id number]     Arrival date & time 04/06/14  1815 History   First MD Initiated Contact with Patient 04/06/14 1823     Chief Complaint  Patient presents with  . Shortness of Breath     (Consider location/radiation/quality/duration/timing/severity/associated sxs/prior Treatment) HPI Patient presents with concerns of dyspnea, this is according to EMS providers, Associates of the patient. The patient herself offers brief responses to questions, secondary to Huntington's disease. Patient denies pain. She does acknowledge dyspnea. There is limitation in the history of present illness secondary to her disease, level V caveat. Patient denies fever. Per report the patient had one episode of cough, possible aspiration, with subsequent increase in his dyspnea. The patient is DO NOT RESUSCITATE, though she does desire medical interventions for changes such as pneumonia.  Past Medical History  Diagnosis Date  . Huntington disease   . Atrial fibrillation   . Chronic respiratory failure     nocturnal oxygen  . Complication of anesthesia     felt surgery with epidural  . GERD (gastroesophageal reflux disease)   . Thyroid disease    Past Surgical History  Procedure Laterality Date  . Colon surgery    . Abdominal hysterectomy    . Hemorrhoid surgery    . Tonsillectomy    . Appendectomy    . Cholecystectomy  11/28/2011    Procedure: LAPAROSCOPIC CHOLECYSTECTOMY;  Surgeon: Emelia Loron, MD;  Location: [facility];  Service: General;  Laterality: N/A;   Family History  Problem Relation Age of Onset  . Huntington's disease    . Diabetes type II    . Coronary artery disease    . Heart disease Mother   . Stroke Father   . Heart disease Brother    History  Substance Use Topics  . Smoking status: Former Smoker    Quit date: 09/22/1998  . Smokeless tobacco: Never Used  . Alcohol Use: No   OB History   Grav Para Term Preterm Abortions TAB SAB Ect Mult Living                  Review of Systems  Unable to perform ROS: Patient nonverbal      Allergies  Codeine and Penicillins cross reactors  Home Medications   Prior to Admission medications   Medication Sig Start Date End Date Taking? Authorizing Provider  acetaminophen (TYLENOL) 325 MG tablet Take 650 mg by mouth every 4 (four) hours as needed (pain).   Yes Historical Provider, MD  ALPRAZolam (XANAX) 0.25 MG tablet Take 0.25-0.5 mg by mouth 2 (two) times daily as needed for anxiety (anxiety). take 1tab 0.25mg  qdprn and 2tablets (0.5mg )qhsprn   Yes Historical Provider, MD  aspirin EC 81 MG tablet Take 81 mg by mouth daily.     Yes Historical Provider, MD  atropine 1 % ophthalmic ointment Place 1 application under the tongue 3 (three) times daily. 3-4 drops under tongue for excessive secretions   Yes Historical Provider, MD  clonazePAM (KLONOPIN) 0.5 MG tablet Take 1 mg by mouth at bedtime.    Yes Historical Provider, MD  digoxin (LANOXIN) 0.25 MG tablet Take 0.125 mcg by mouth daily. Takes half a tablet   Yes Historical Provider, MD  estrogens, conjugated, (PREMARIN) 0.3 MG tablet Take 0.3 mg by mouth daily. Take daily for 21 days then do not take for 7 days.   Yes Historical Provider, MD  feeding supplement, ENSURE COMPLETE, (ENSURE COMPLETE) LIQD Take 120 mLs by mouth 2 (two) times daily  between meals.   Yes Historical Provider, MD  FLUoxetine (PROZAC) 20 MG capsule Take 20 mg by mouth daily.     Yes Historical Provider, MD  haloperidol (HALDOL) 0.5 MG tablet Take 0.5 mg by mouth 2 (two) times daily.     Yes Historical Provider, MD  ibuprofen (ADVIL,MOTRIN) 800 MG tablet Take 800 mg by mouth every 8 (eight) hours as needed. For pain    Yes Historical Provider, MD  levothyroxine (SYNTHROID, LEVOTHROID) 75 MCG tablet Take 75 mcg by mouth daily before breakfast.   Yes Historical Provider, MD  loperamide (IMODIUM A-D) 2 MG tablet Take 2 mg by mouth 4 (four) times daily as needed for diarrhea or loose stools  (loose stools).   Yes Historical Provider, MD  meclizine (ANTIVERT) 25 MG tablet Take 25 mg by mouth 3 (three) times daily as needed. For dizziness/nausea.   Yes Historical Provider, MD  morphine (ROXANOL) 20 MG/ML concentrated solution Take 5-10 mg by mouth every 2 (two) hours as needed for severe pain (dyspnea/pain).   Yes Historical Provider, MD  nitroGLYCERIN (NITROSTAT) 0.4 MG SL tablet Place 0.4 mg under the tongue every 5 (five) minutes x 3 doses as needed.   Yes Historical Provider, MD  omeprazole (PRILOSEC) 20 MG capsule Take 20 mg by mouth daily.   Yes Historical Provider, MD  ondansetron (ZOFRAN-ODT) 8 MG disintegrating tablet Take 8 mg by mouth every 8 (eight) hours as needed for nausea. For nausea. 10/02/11  Yes Penny Pia, MD  polyethylene glycol (MIRALAX / GLYCOLAX) packet Take 17 g by mouth daily. For constipation. 03/10/14  Yes Shanker Levora Dredge, MD  promethazine (PHENERGAN) 25 MG suppository Place 25 mg rectally every 8 (eight) hours as needed. For nausea.   Yes Historical Provider, MD  senna-docusate (SENOKOT-S) 8.6-50 MG per tablet Take 2 tablets by mouth at bedtime. 03/10/14  Yes Shanker Levora Dredge, MD  AMBULATORY NON FORMULARY MEDICATION Morphine Sul Sol 100/59ml Sig: Administer 0.13ml every 2 hours as needed for pain/ dyspnea; Administer 0.99ml every 2 hours as needed for dyspnea/pain 03/06/14   Tiffany L Reed, DO   BP 117/61  Pulse 81  Temp(Src) 98.1 F (36.7 C) (Oral)  Resp 16  SpO2 96% Physical Exam  Nursing note and vitals reviewed. Constitutional:  Frail, ill-appearing female, shaking  HENT:  Head: Normocephalic and atraumatic.  Very poor dentition with multiple eroded teeth  Eyes: Conjunctivae are normal. Right eye exhibits no discharge. Left eye exhibits no discharge.  Neck: No tracheal deviation present.  Cardiovascular: Normal rate and regular rhythm.   Pulmonary/Chest: No stridor.  Shallow inspiration  Abdominal: She exhibits no distension. There is no  tenderness.  Musculoskeletal: She exhibits no edema.  No gross deformity, diffuse atrophy  Neurological:  Patient is awake alert, though she is essentially nonverbal, has baseline tremor, does not follow neurologic commands reliably.   Skin: Skin is warm and dry.  Psychiatric:  Unable to assess    ED Course  Procedures (including critical care time) Labs Review Labs Reviewed  CBC WITH DIFFERENTIAL  COMPREHENSIVE METABOLIC PANEL  PRO B NATRIURETIC PEPTIDE  URINALYSIS, ROUTINE W REFLEX MICROSCOPIC  I-STAT CG4 LACTIC ACID, ED    Imaging Review Dg Chest Port 1 View  04/06/2014   CLINICAL DATA:  Dyspnea. Aspiration pneumonia. Huntington's disease.  EXAM: PORTABLE CHEST - 1 VIEW  COMPARISON:  03/08/2014  FINDINGS: Persistent airspace opacity is seen in the retrocardiac left lower lobe. Right lung remains clear. No evidence of pleural effusion. Heart size  is stable.  IMPRESSION: Persistent airspace disease in left retrocardiac lung base, suspicious for pneumonia.   Electronically Signed   By: Myles Rosenthal M.D.   On: 04/06/2014 18:48     EKG Interpretation   Date/Time:  Thursday April 06 2014 18:38:37 EDT Ventricular Rate:  85 PR Interval:  173 QRS Duration: 102 QT Interval:  376 QTC Calculation: 447 R Axis:   -38 Text Interpretation:  Sinus rhythm Abnormal R-wave progression, early  transition LVH with secondary repolarization abnormality Sinus rhythm T  wave abnormality No significant change since last tracing Left ventricular  hypertrophy Abnormal ekg Confirmed by Gerhard Munch  MD [phone]) on  04/06/2014 7:26:27 PM     On exam the patient appears slightly better. Discussed all findings with her and her health care power of attorney, and a companion. With concern for pneumonia, patient will be admitted for further evaluation and management.  MDM  Patient's Huntington's disease presents after possible aspiration event with dyspnea.  On on exam the patient is awake,  alert, but essentially nonverbal. Patient has decreased oxygenation with shallow inspiration, but with deep inspiration she has 99% oxygen saturation. Patient's labs are otherwise reassuring.  The patient's recent hospitalization for pneumonia, her comorbidities, she was started on antibiotics, admitted for further evaluation, management.     Gerhard Munch, MD 04/06/14 2209

## 2014-04-06 NOTE — Progress Notes (Addendum)
ANTIBIOTIC CONSULT NOTE - INITIAL  Pharmacy Consult for Vancomycin Indication: pneumonia  Allergies  Allergen Reactions  . Codeine Itching  . Penicillins Cross Reactors Hives    Patient Measurements:   Adjusted Body Weight:    Vital Signs: Temp: 98 F (36.7 C) (09/10 1930) Temp src: Temporal (09/10 1930) BP: 126/76 mmHg (09/10 2100) Pulse Rate: 72 (09/10 2100) Intake/Output from previous day:   Intake/Output from this shift:    Labs:  Recent Labs  04/06/14 1853  WBC 9.4  HGB 13.1  PLT 251  CREATININE 0.68   The CrCl is unknown because both a height and weight (above a minimum accepted value) are required for this calculation. No results found for this basename: VANCOTROUGH, Leodis Binet, VANCORANDOM, GENTTROUGH, GENTPEAK, GENTRANDOM, TOBRATROUGH, TOBRAPEAK, TOBRARND, AMIKACINPEAK, AMIKACINTROU, AMIKACIN,  in the last 72 hours   Microbiology: Recent Results (from the past 720 hour(s))  CULTURE, BLOOD (ROUTINE X 2)     Status: None   Collection Time    03/08/14  3:20 AM      Result Value Ref Range Status   Specimen Description BLOOD RIGHT ARM   Final   Special Requests BOTTLES DRAWN AEROBIC AND ANAEROBIC 10CC EACH   Final   Culture  Setup Time     Final   Value: 03/08/2014 08:37     Performed at [facility]   Culture     Final   Value: NO GROWTH 5 DAYS     Performed at [facility]   Report Status 03/14/2014 FINAL   Final  CULTURE, BLOOD (ROUTINE X 2)     Status: None   Collection Time    03/08/14  3:32 AM      Result Value Ref Range Status   Specimen Description BLOOD LEFT HAND   Final   Special Requests BOTTLES DRAWN AEROBIC ONLY 10CC   Final   Culture  Setup Time     Final   Value: 03/08/2014 08:37     Performed at [facility]   Culture     Final   Value: NO GROWTH 5 DAYS     Performed at [facility]   Report Status 03/14/2014 FINAL   Final  MRSA PCR SCREENING     Status: Abnormal   Collection Time    03/08/14  8:30  AM      Result Value Ref Range Status   MRSA by PCR POSITIVE (*) NEGATIVE Final   Comment:            The GeneXpert MRSA Assay (FDA     approved for NASAL specimens     only), is one component of a     comprehensive MRSA colonization     surveillance program. It is not     intended to diagnose MRSA     infection nor to guide or     monitor treatment for     MRSA infections.     RESULT CALLED TO, READ BACK BY AND VERIFIED WITH:     R. L'ESPERANCE RN 10:20 03/08/14 ([email or web address])    Medical History: Past Medical History  Diagnosis Date  . Huntington disease   . Atrial fibrillation   . Chronic respiratory failure     nocturnal oxygen  . Complication of anesthesia     felt surgery with epidural  . GERD (gastroesophageal reflux disease)   . Thyroid disease     Medications:  See med rec  Assessment: SOB 79 y/o F presents with  c/o SOB from ongoing PNA (recent hospitalization 8/15). Afebrile. WBC 9.4. Scr 0.68 (estimated CrCl 47)  Goal of Therapy:  Vancomycin trough level 15-20 mcg/ml  Plan:  Cefepime 1g IV x 1 in ED, then adjust to 1g IV q24h. Vancomycin  IV q12h. Trough at steady state.     Donnie Panik S. Merilynn Finland, PharmD, BCPS Clinical Staff Pharmacist Pager [phone]  Misty Stanley Stillinger 04/06/2014,9:12 PM

## 2014-04-06 NOTE — ED Notes (Signed)
Attempted I&O cath 2x, cath resisted 2x. Unable to obtain urine at the time

## 2014-04-06 NOTE — ED Notes (Signed)
Sputum culture not collect

## 2014-04-06 NOTE — ED Notes (Addendum)
Pt to ED from home c/o increased shortness of breath and recently diagnosed with pneumonia. Pt alert to name and can follow some commands. Mouth appears dry. Pt's HPOA reports pt has been having intermittent fevers and chills since discharge from hospital in August

## 2014-04-06 NOTE — ED Notes (Signed)
Dr.Lockwood at bedside  

## 2014-04-06 NOTE — H&P (Signed)
Hospitalist Admission History and Physical  Patient name: Sydney [name] Medical record number: [id number] Date of birth: 01/24/1935 Age: 79 y.o. Gender: female  Primary Care Provider: Ginette Otto, MD  Chief Complaint: HCAP  History of Present Illness:This is a 79 y.o. 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 Afib  presenting with HCAP. Pt noted to have been admitted 2-3 weeks ago for aspiration PNA. Please see discharge summary for full details. Pt was discharged on a 3 day course of oral antibiotics. Per caregiver/HCPOA, pt has had recurrent cough and? Aspiration events at home. Called PCP around time of end of oral abx out of concern for repeat aspiration. Had outpt f/u CXR that showed persistent PNA. PCP placed her on extended course of abx. Caregiver states that she was discharged with thickened liquids. Pt has been otherwise eating regular food. Caregiver states that pt had a witnessed episode of aspiration today where pt had difficulty breathing after eating, requiring 2 heimlich manuevers. EMS subsequently called  On arrival to the ER, afebrile, hemodynamically stable. HR 70s-80s. Resp 10a-20s. BP 110s-130s. Satting >93% on RA. CBC and BMET WNL. CXR shows Persistent airspace disease in left retrocardiac lung base,  suspicious for pneumonia. Started on vanc and cefepime by EDP.   Assessment and Plan: Sydney [name] is a 79 y.o. year old female presenting with HCAP  Active Problems:   HCAP (healthcare-associated pneumonia)   1-HCAP  -Likely secondary to aspiration event-recurrent issue  -vanc and cefepime -panculture-urine strep and legionella. -Had a relatively lengthy discussion with HCPOA and pt that this will likely continue to be a recurrent issue given end stage status of huntington's disease. Pt/HCPOA refuses peg tube. May need to have discussion with hospice/PCP about overall  plan of care for this issue. Pt/HCPOA agree   2-Huntington's disease  -end stage -continue home regimen   3-Hx/o Afib  -NSR today on EKG  -not on anticoagulation likely secondary to end stage status of huntington's  -cont asa  FEN/GI: dysphagia 3 diet. Discuss work up options with pt/HCPOA as indicated (MBS).  Prophylaxis: sub q heparin  Disposition: pending further evaluation  Code Status:Full Code    Patient Active Problem List   Diagnosis Date Noted  . HCAP (healthcare-associated pneumonia) 04/06/2014  . Pneumonia 03/08/2014  . Status post laparoscopic cholecystectomy 11/30/2011  . UTI (lower urinary tract infection) 11/27/2011  . Constipation 11/24/2011  . Hypothyroidism 11/24/2011  . Vertigo 09/24/2011  . Aspiration pneumonia 09/23/2011  . Huntington's disease 09/23/2011  . Campath-induced atrial fibrillation 09/23/2011   Past Medical History: Past Medical History  Diagnosis Date  . Huntington disease   . Atrial fibrillation   . Chronic respiratory failure     nocturnal oxygen  . Complication of anesthesia     felt surgery with epidural  . GERD (gastroesophageal reflux disease)   . Thyroid disease     Past Surgical History: Past Surgical History  Procedure Laterality Date  . Colon surgery    . Abdominal hysterectomy    . Hemorrhoid surgery    . Tonsillectomy    . Appendectomy    . Cholecystectomy  11/28/2011    Procedure: LAPAROSCOPIC CHOLECYSTECTOMY;  Surgeon: Emelia Loron, MD;  Location: [facility];  Service: General;  Laterality: N/A;    Social History: History   Social History  . Marital Status: Single    Spouse Name: N/A    Number of Children: N/A  .  Years of Education: N/A   Social History Main Topics  . Smoking status: Former Smoker    Quit date: 09/22/1998  . Smokeless tobacco: Never Used  . Alcohol Use: No  . Drug Use: No  . Sexual Activity: None   Other Topics Concern  . None   Social History Narrative  . None    Family  History: Family History  Problem Relation Age of Onset  . Huntington's disease    . Diabetes type II    . Coronary artery disease    . Heart disease Mother   . Stroke Father   . Heart disease Brother     Allergies: Allergies  Allergen Reactions  . Codeine Itching  . Penicillins Cross Reactors Hives    Current Facility-Administered Medications  Medication Dose Route Frequency Provider Last Rate Last Dose  . 0.9 %  sodium chloride infusion   Intravenous Continuous Doree Albee, MD      . acetaminophen (TYLENOL) tablet 650 mg  650 mg Oral Q4H PRN Doree Albee, MD      . ALPRAZolam Prudy Feeler) tablet 0.25-0.5 mg  0.25-0.5 mg Oral BID PRN Doree Albee, MD      . Melene Muller ON 04/07/2014] aspirin EC tablet 81 mg  81 mg Oral Daily Doree Albee, MD      . atropine 1 % ophthalmic ointment 1 application  1 application Both Eyes TID Doree Albee, MD      . ceFEPIme (MAXIPIME) 1 g in dextrose 5 % 50 mL IVPB  1 g Intravenous Once Gerhard Munch, MD 100 mL/hr at 04/06/14 2146 1 g at 04/06/14 2146  . ceFEPIme (MAXIPIME) 1 g in dextrose 5 % 50 mL IVPB  1 g Intravenous 3 times per day Doree Albee, MD      . clonazePAM Scarlette Calico) tablet 1 mg  1 mg Oral QHS Doree Albee, MD      . Melene Muller ON 04/07/2014] digoxin (LANOXIN) tablet 0.125 mg  0.125 mg Oral Daily Doree Albee, MD      . Melene Muller ON 04/07/2014] estrogens (conjugated) (PREMARIN) tablet 0.3 mg  0.3 mg Oral Daily Doree Albee, MD      . Melene Muller ON 04/07/2014] feeding supplement (ENSURE COMPLETE) (ENSURE COMPLETE) liquid 120 mL  120 mL Oral BID BM Doree Albee, MD      . Melene Muller ON 04/07/2014] FLUoxetine (PROZAC) capsule 20 mg  20 mg Oral Daily Doree Albee, MD      . haloperidol (HALDOL) tablet 0.5 mg  0.5 mg Oral BID Doree Albee, MD      . heparin injection 5,000 Units  5,000 Units Subcutaneous 3 times per day Doree Albee, MD      . ibuprofen (ADVIL,MOTRIN) tablet 800 mg  800 mg Oral Q8H PRN Doree Albee, MD      . Melene Muller ON 04/07/2014]  levothyroxine (SYNTHROID, LEVOTHROID) tablet 75 mcg  75 mcg Oral QAC breakfast Doree Albee, MD      . loperamide (IMODIUM A-D) tablet 2 mg  2 mg Oral QID PRN Doree Albee, MD      . meclizine (ANTIVERT) tablet 25 mg  25 mg Oral TID PRN Doree Albee, MD      . morphine (ROXANOL) 20 MG/ML concentrated solution 5-10 mg  5-10 mg Oral Q2H PRN Doree Albee, MD      . nitroGLYCERIN (NITROSTAT) SL tablet 0.4 mg  0.4 mg Sublingual Q5 Min x 3 PRN Doree Albee, MD      . NON FORMULARY 0.25 mL  0.25  mL Oral Q2H PRN Doree Albee, MD      . ondansetron (ZOFRAN-ODT) disintegrating tablet 8 mg  8 mg Oral Q8H PRN Doree Albee, MD      . Melene Muller ON 04/07/2014] pantoprazole (PROTONIX) EC tablet 40 mg  40 mg Oral Daily Doree Albee, MD      . promethazine (PHENERGAN) suppository 25 mg  25 mg Rectal Q8H PRN Doree Albee, MD      . senna-docusate (Senokot-S) tablet 2 tablet  2 tablet Oral QHS Doree Albee, MD      . vancomycin (VANCOCIN) 500 mg in sodium chloride 0.9 % 100 mL IVPB  500 mg Intravenous Q12H Crystal Salomon Fick, [facility]       Current Outpatient Prescriptions  Medication Sig Dispense Refill  . acetaminophen (TYLENOL) 325 MG tablet Take 650 mg by mouth every 4 (four) hours as needed (pain).      Marland Kitchen ALPRAZolam (XANAX) 0.25 MG tablet Take 0.25-0.5 mg by mouth 2 (two) times daily as needed for anxiety (anxiety). take 1tab 0.25mg  qdprn and 2tablets (0.5mg )qhsprn      . aspirin EC 81 MG tablet Take 81 mg by mouth daily.        Marland Kitchen atropine 1 % ophthalmic ointment Place 1 application under the tongue 3 (three) times daily. 3-4 drops under tongue for excessive secretions      . clonazePAM (KLONOPIN) 0.5 MG tablet Take 1 mg by mouth at bedtime.       . digoxin (LANOXIN) 0.25 MG tablet Take 0.125 mcg by mouth daily. Takes half a tablet      . estrogens, conjugated, (PREMARIN) 0.3 MG tablet Take 0.3 mg by mouth daily. Take daily for 21 days then do not take for 7 days.      . feeding supplement, ENSURE  COMPLETE, (ENSURE COMPLETE) LIQD Take 120 mLs by mouth 2 (two) times daily between meals.      Marland Kitchen FLUoxetine (PROZAC) 20 MG capsule Take 20 mg by mouth daily.        . haloperidol (HALDOL) 0.5 MG tablet Take 0.5 mg by mouth 2 (two) times daily.        Marland Kitchen ibuprofen (ADVIL,MOTRIN) 800 MG tablet Take 800 mg by mouth every 8 (eight) hours as needed. For pain       . levothyroxine (SYNTHROID, LEVOTHROID) 75 MCG tablet Take 75 mcg by mouth daily before breakfast.      . loperamide (IMODIUM A-D) 2 MG tablet Take 2 mg by mouth 4 (four) times daily as needed for diarrhea or loose stools (loose stools).      . meclizine (ANTIVERT) 25 MG tablet Take 25 mg by mouth 3 (three) times daily as needed. For dizziness/nausea.      Marland Kitchen morphine (ROXANOL) 20 MG/ML concentrated solution Take 5-10 mg by mouth every 2 (two) hours as needed for severe pain (dyspnea/pain).      . nitroGLYCERIN (NITROSTAT) 0.4 MG SL tablet Place 0.4 mg under the tongue every 5 (five) minutes x 3 doses as needed.      Marland Kitchen omeprazole (PRILOSEC) 20 MG capsule Take 20 mg by mouth daily.      . ondansetron (ZOFRAN-ODT) 8 MG disintegrating tablet Take 8 mg by mouth every 8 (eight) hours as needed for nausea. For nausea.      . polyethylene glycol (MIRALAX / GLYCOLAX) packet Take 17 g by mouth daily. For constipation.  14 each  0  . promethazine (PHENERGAN) 25 MG suppository Place 25 mg rectally every 8 (  eight) hours as needed. For nausea.      Marland Kitchen senna-docusate (SENOKOT-S) 8.6-50 MG per tablet Take 2 tablets by mouth at bedtime.  60 tablet  0  . AMBULATORY NON FORMULARY MEDICATION Morphine Sul Sol 100/6ml Sig: Administer 0.59ml every 2 hours as needed for pain/ dyspnea; Administer 0.11ml every 2 hours as needed for dyspnea/pain  120 mL  0   Review Of Systems: 12 point ROS negative except as noted above in HPI.  Physical Exam: Filed Vitals:   04/06/14 2130  BP: 124/63  Pulse: 74  Temp:   Resp: 25    General: baseline chorea  HEENT: PERRLA, extra  ocular movement intact and dry oral mucosa Heart: S1, S2 normal, no murmur, rub or gallop, regular rate and rhythm Lungs: clear to auscultation, no wheezes or rales and unlabored breathing Abdomen: abdomen is soft without significant tenderness, masses, organomegaly or guarding Extremities: extremities normal, atraumatic, no cyanosis or edema Skin:no rashes, no ecchymoses Neurology: normal without focal findings  Labs and Imaging: Lab Results  Component Value Date/Time   NA 141 04/06/2014  6:53 PM   K 3.9 04/06/2014  6:53 PM   CL 103 04/06/2014  6:53 PM   CO2 25 04/06/2014  6:53 PM   BUN 16 04/06/2014  6:53 PM   CREATININE 0.68 04/06/2014  6:53 PM   GLUCOSE 80 04/06/2014  6:53 PM   Lab Results  Component Value Date   WBC 9.4 04/06/2014   HGB 13.1 04/06/2014   HCT 39.9 04/06/2014   MCV 88.7 04/06/2014   PLT 251 04/06/2014    Dg Chest Port 1 View  04/06/2014   CLINICAL DATA:  Dyspnea. Aspiration pneumonia. Huntington's disease.  EXAM: PORTABLE CHEST - 1 VIEW  COMPARISON:  03/08/2014  FINDINGS: Persistent airspace opacity is seen in the retrocardiac left lower lobe. Right lung remains clear. No evidence of pleural effusion. Heart size is stable.  IMPRESSION: Persistent airspace disease in left retrocardiac lung base, suspicious for pneumonia.   Electronically Signed   By: Myles Rosenthal M.D.   On: 04/06/2014 18:48           Doree Albee MD  Pager: [phone]

## 2014-04-06 NOTE — ED Notes (Signed)
Dr. Newton at bedside. 

## 2014-04-06 NOTE — ED Notes (Signed)
Patient with c/o increased sob. Patient with ongoing dx of pneumonia and is currently on antibiotics for same, \

## 2014-04-07 ENCOUNTER — Encounter (HOSPITAL_COMMUNITY): Payer: Self-pay | Admitting: General Practice

## 2014-04-07 ENCOUNTER — Observation Stay (HOSPITAL_COMMUNITY)

## 2014-04-07 DIAGNOSIS — J9610 Chronic respiratory failure, unspecified whether with hypoxia or hypercapnia: Secondary | ICD-10-CM | POA: Diagnosis present

## 2014-04-07 DIAGNOSIS — Z66 Do not resuscitate: Secondary | ICD-10-CM | POA: Diagnosis present

## 2014-04-07 DIAGNOSIS — E440 Moderate protein-calorie malnutrition: Secondary | ICD-10-CM | POA: Insufficient documentation

## 2014-04-07 DIAGNOSIS — E43 Unspecified severe protein-calorie malnutrition: Secondary | ICD-10-CM | POA: Diagnosis present

## 2014-04-07 DIAGNOSIS — Z79899 Other long term (current) drug therapy: Secondary | ICD-10-CM | POA: Diagnosis not present

## 2014-04-07 DIAGNOSIS — I4891 Unspecified atrial fibrillation: Secondary | ICD-10-CM | POA: Diagnosis present

## 2014-04-07 DIAGNOSIS — R5383 Other fatigue: Secondary | ICD-10-CM | POA: Diagnosis present

## 2014-04-07 DIAGNOSIS — Z87891 Personal history of nicotine dependence: Secondary | ICD-10-CM | POA: Diagnosis not present

## 2014-04-07 DIAGNOSIS — R627 Adult failure to thrive: Secondary | ICD-10-CM | POA: Diagnosis present

## 2014-04-07 DIAGNOSIS — R5381 Other malaise: Secondary | ICD-10-CM | POA: Diagnosis present

## 2014-04-07 DIAGNOSIS — J189 Pneumonia, unspecified organism: Secondary | ICD-10-CM | POA: Diagnosis present

## 2014-04-07 DIAGNOSIS — IMO0002 Reserved for concepts with insufficient information to code with codable children: Secondary | ICD-10-CM | POA: Diagnosis not present

## 2014-04-07 DIAGNOSIS — Z7982 Long term (current) use of aspirin: Secondary | ICD-10-CM | POA: Diagnosis not present

## 2014-04-07 DIAGNOSIS — Z9981 Dependence on supplemental oxygen: Secondary | ICD-10-CM | POA: Diagnosis not present

## 2014-04-07 DIAGNOSIS — Z515 Encounter for palliative care: Secondary | ICD-10-CM | POA: Diagnosis not present

## 2014-04-07 DIAGNOSIS — K219 Gastro-esophageal reflux disease without esophagitis: Secondary | ICD-10-CM | POA: Diagnosis present

## 2014-04-07 DIAGNOSIS — E039 Hypothyroidism, unspecified: Secondary | ICD-10-CM | POA: Diagnosis present

## 2014-04-07 DIAGNOSIS — G10 Huntington's disease: Secondary | ICD-10-CM | POA: Diagnosis present

## 2014-04-07 HISTORY — DX: Pneumonia, unspecified organism: J18.9

## 2014-04-07 LAB — CBC WITH DIFFERENTIAL/PLATELET
Basophils Absolute: 0 10*3/uL (ref 0.0–0.1)
Basophils Relative: 0 % (ref 0–1)
EOS ABS: 0.4 10*3/uL (ref 0.0–0.7)
Eosinophils Relative: 4 % (ref 0–5)
HCT: 36.8 % (ref 36.0–46.0)
Hemoglobin: 12 g/dL (ref 12.0–15.0)
LYMPHS ABS: 2.8 10*3/uL (ref 0.7–4.0)
Lymphocytes Relative: 32 % (ref 12–46)
MCH: 29.1 pg (ref 26.0–34.0)
MCHC: 32.6 g/dL (ref 30.0–36.0)
MCV: 89.1 fL (ref 78.0–100.0)
Monocytes Absolute: 0.8 10*3/uL (ref 0.1–1.0)
Monocytes Relative: 9 % (ref 3–12)
Neutro Abs: 4.8 10*3/uL (ref 1.7–7.7)
Neutrophils Relative %: 55 % (ref 43–77)
PLATELETS: 237 10*3/uL (ref 150–400)
RBC: 4.13 MIL/uL (ref 3.87–5.11)
RDW: 13.9 % (ref 11.5–15.5)
WBC: 8.7 10*3/uL (ref 4.0–10.5)

## 2014-04-07 LAB — LEGIONELLA ANTIGEN, URINE: Legionella Antigen, Urine: NEGATIVE

## 2014-04-07 LAB — HIV ANTIBODY (ROUTINE TESTING W REFLEX): HIV: NONREACTIVE

## 2014-04-07 LAB — COMPREHENSIVE METABOLIC PANEL
ALT: <5 U/L (ref 0–35)
ANION GAP: 9 (ref 5–15)
AST: 7 U/L (ref 0–37)
Albumin: 2.5 g/dL — ABNORMAL LOW (ref 3.5–5.2)
Alkaline Phosphatase: 96 U/L (ref 39–117)
BUN: 14 mg/dL (ref 6–23)
CALCIUM: 8.2 mg/dL — AB (ref 8.4–10.5)
CO2: 26 mEq/L (ref 19–32)
Chloride: 105 mEq/L (ref 96–112)
Creatinine, Ser: 0.63 mg/dL (ref 0.50–1.10)
GFR, EST NON AFRICAN AMERICAN: 84 mL/min — AB (ref >90)
GLUCOSE: 85 mg/dL (ref 70–99)
Potassium: 3.7 mEq/L (ref 3.7–5.3)
SODIUM: 140 meq/L (ref 137–147)
Total Bilirubin: 0.3 mg/dL (ref 0.3–1.2)
Total Protein: 6 g/dL (ref 6.0–8.3)

## 2014-04-07 LAB — MRSA PCR SCREENING: MRSA by PCR: POSITIVE — AB

## 2014-04-07 MED ORDER — DEXTROSE 5 % IV SOLN
1.0000 g | Freq: Two times a day (BID) | INTRAVENOUS | Status: DC
  Administered 2014-04-07 – 2014-04-10 (×5): 1 g via INTRAVENOUS
  Filled 2014-04-07 (×8): qty 1

## 2014-04-07 MED ORDER — LORAZEPAM 2 MG/ML IJ SOLN
0.5000 mg | Freq: Once | INTRAMUSCULAR | Status: AC
  Administered 2014-04-07: 0.5 mg via INTRAVENOUS
  Filled 2014-04-07: qty 1

## 2014-04-07 MED ORDER — STARCH (THICKENING) PO POWD
ORAL | Status: DC | PRN
  Filled 2014-04-07: qty 227

## 2014-04-07 MED ORDER — ENSURE PUDDING PO PUDG
1.0000 | Freq: Three times a day (TID) | ORAL | Status: DC
  Administered 2014-04-07 – 2014-04-10 (×8): 1 via ORAL

## 2014-04-07 MED ORDER — LORAZEPAM BOLUS VIA INFUSION: 0.5000 mg | Freq: Once | INTRAVENOUS | Status: DC

## 2014-04-07 NOTE — Progress Notes (Signed)
Patient ID: Sydney [name], female   DOB: 01-23-1935, 79 y.o.   MRN: [id number]  [facility] PROGRESS NOTE  GABREILLE DARDIS VQQ:[id number] DOB: 05-Jan-1935 DOA: 04/06/2014 PCP: Ginette Otto, MD  Brief narrative: 79 y.o. 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 Afib presenting progressive weakness, failure to thrive, poor oral intake, dyspnea, non productive cough.  Pt noted to have been admitted 2-3 weeks ago for aspiration PNA. Pt was discharged on a 3 day course of oral antibiotics. Per caregiver/HCPOA, pt has had recurrent cough and? Aspiration events at home.   On arrival to the ER, pt was afebrile, hemodynamically stable. HR 70s-80s. Resp 10a-20s. BP 110s-130s. Satting >93% on RA. CBC and BMET WNL. CXR shows Persistent airspace disease in left retrocardiac lung base, suspicious for pneumonia. Started on vanc and cefepime by EDP.   Assessment and Plan:    Active Problems:   HCAP (healthcare-associated pneumonia) - pt clinically stable this AM  And reports feeling better - denies shortness of breath and chest pain - continue Vancomycin and Maxipime day #2 - provide oxygen as needed - SLP pending    Severe PCM - secondary to progressive nature of the illness - SLP consult requested   Huntington's disease - PT/OT once pt more alert   DVT prophylaxis  Heparin SQ while pt is in hospital  Code Status: DNR Family Communication: Pt and daughter at bedside Disposition Plan: Remains inpatient    IV Access:   Peripheral IV Procedures and diagnostic studies:   Dg Chest 2 View  04/07/2014  No acute cardiopulmonary disease.   Dg Chest Port 1 View  04/06/2014  Persistent airspace disease in left retrocardiac lung base, suspicious for pneumonia.    Medical Consultants:   None  Other Consultants:   Physical therapy  SLP Anti-Infectives:   Maxipime 9/10 --> Vancomycin 9/10 -->  Debbora Presto, MD  [facility] Pager  [phone]  If 7PM-7AM, please contact night-coverage [email or web address] Password [facility] 04/07/2014, 3:36 PM   LOS: 1 day   HPI/Subjective: No events overnight.   Objective: Filed Vitals:   04/06/14 2331 04/07/14 0542 04/07/14 0938 04/07/14 1409  BP: 128/63 105/55  110/64  Pulse: 73 60 72 73  Temp: 98.8 F (37.1 C) 97.7 F (36.5 C)  97.8 F (36.6 C)  TempSrc: Oral Oral  Oral  Resp: Height:  (1.676 m)     Weight: 60.2 kg (132 lb 11.5 oz)     SpO2: 93% 96%  99%    Intake/Output Summary (Last 24 hours) at 04/07/14 1536 Last data filed at 04/06/14 2221  Gross per 24 hour  Intake   1000 ml  Output    200 ml  Net    800 ml    Exam:   General:  Pt is alert, follows commands appropriately, not in acute distress  Cardiovascular: Regular rate and rhythm, S1/S2, no murmurs, no rubs, no gallops  Respiratory: Clear to auscultation bilaterally, no wheezing, no crackles, no rhonchi  Abdomen: Soft, non tender, non distended, bowel sounds present, no guarding  Extremities: No edema, pulses DP and PT palpable bilaterally  Neuro: Grossly nonfocal  Data Reviewed: Basic Metabolic Panel:  Recent Labs Lab 04/06/14 1853 04/06/14 2254 04/07/14 0500  NA 141  --  140  K 3.9  --  3.7  CL 103  --  105  CO2 25  --  26  GLUCOSE 80  --  85  BUN 16  --  14  CREATININE 0.68 0.56 0.63  CALCIUM 8.6  --  8.2*   Liver Function Tests:  Recent Labs Lab 04/06/14 1853 04/07/14 0500  AST 9 7  ALT <5 <5  ALKPHOS 101 96  BILITOT 0.2* 0.3  PROT 6.8 6.0  ALBUMIN 2.8* 2.5*   No results found for this basename: LIPASE, AMYLASE,  in the last 168 hours No results found for this basename: AMMONIA,  in the last 168 hours CBC:  Recent Labs Lab 04/06/14 1853 04/06/14 2254 04/07/14 0500  WBC 9.4 9.0 8.7  NEUTROABS 5.8  --  4.8  HGB 13.1 12.4 12.0  HCT 39.9 37.3 36.8  MCV 88.7 88.2 89.1  PLT 251 232 237   Recent Results (from the past 240 hour(s))  GRAM STAIN      Status: None   Collection Time    04/06/14 10:20 PM      Result Value Ref Range Status   Specimen Description     Final   Value: URINE, RANDOM CONFIRMED WITH KOKO Prince Solian [id number] [facility] M   Special Requests NONE   Final   Gram Stain     Final   Value: CYTOSPUN     WBC PRESENT, PREDOMINANTLY MONONUCLEAR     NO ORGANISMS SEEN   Report Status 04/06/2014 FINAL   Final  MRSA PCR SCREENING     Status: Abnormal   Collection Time    04/07/14  7:43 AM      Result Value Ref Range Status   MRSA by PCR POSITIVE (*) NEGATIVE Final   Comment:            The GeneXpert MRSA Assay (FDA     approved for NASAL specimens     only), is one component of a     comprehensive MRSA colonization     surveillance program. It is not     intended to diagnose MRSA     infection nor to guide or     monitor treatment for     MRSA infections.     RESULT CALLED TO, READ BACK BY AND VERIFIED WITH:     THOMPSON RN 10:15 04/07/14 ([email or web address])     Scheduled Meds: . aspirin EC  81 mg Oral Daily  . atropine  3-4 drop Sublingual TID  . ceFEPime (MAXIPIME) IV  1 g Intravenous Q12H  . clonazePAM  1 mg Oral QHS  . digoxin  0.125 mg Oral Daily  . FLUoxetine  20 mg Oral Daily  . haloperidol  0.5 mg Oral BID  . heparin  5,000 Units Subcutaneous 3 times per day  . levothyroxine  75 mcg Oral QAC breakfast  . pantoprazole  40 mg Oral Daily  . senna-docusate  2 tablet Oral QHS  . vancomycin  500 mg Intravenous Q12H   Continuous Infusions: . sodium chloride 50 mL/hr at 04/06/14 2225

## 2014-04-07 NOTE — Progress Notes (Signed)
INITIAL NUTRITION ASSESSMENT  DOCUMENTATION CODES Per approved criteria  -Non-severe (moderate) malnutrition in the context of chronic illness   INTERVENTION:  Magic Cups with all meals.  Ensure Pudding PO TID, each supplement provides 170 kcal and 4 grams of protein.  NUTRITION DIAGNOSIS: Malnutrition related to chronic catabolic illness as evidenced by 5% weight loss in 1 month and mild depletion of muscle and subcutaneous fat mass.   Goal: Intake to meet >90% of estimated nutrition needs as able.  Monitor:  PO intake, labs, weight trend.  Reason for Assessment: Low Braden  79 y.o. female  Admitting Dx: HCAP  ASSESSMENT: 79 y.o. 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 Afib presenting with HCAP.  S/P swallow evaluation with SLP today. Diet downgraded to dysphagia 1 with honey-thick liquids with known risk of aspiration. Patient's caregiver states that she drinks Ensure at home sometimes. Provided a website resource for ordering pre-thickened beverages at home. Patient willing to try Ensure pudding and Magic Cups.  Nutrition Focused Physical Exam:  Subcutaneous Fat:  Orbital Region: WNL Upper Arm Region: mild depletion Thoracic and Lumbar Region: mild depletion  Muscle:  Temple Region: moderate depletion Clavicle Bone Region: moderate depletion Clavicle and Acromion Bone Region: moderate depletion Scapular Bone Region: mild depletion Dorsal Hand: mild depletion Patellar Region: moderate depletion Anterior Thigh Region: moderate depletion Posterior Calf Region: moderate depletion  Edema: none  Pt meets criteria for non-severe (moderate) MALNUTRITION in the context of chronic illness as evidenced by 5% weight loss within 1 month and mild depletion of muscle and subcutaneous fat mass.   Height: Ht Readings from Last 1 Encounters:  04/06/14  (1.676 m)    Weight: Wt  Readings from Last 1 Encounters:  04/06/14 132 lb 11.5 oz (60.2 kg)    Ideal Body Weight: 59.1 kg  % Ideal Body Weight: 102%  Wt Readings from Last 10 Encounters:  04/06/14 132 lb 11.5 oz (60.2 kg)  03/08/14 139 lb 8.8 oz (63.3 kg)  01/28/12 116 lb (52.617 kg)  12/15/11 115 lb 2 oz (52.22 kg)  12/03/11 134 lb 14.7 oz (61.2 kg)  12/03/11 134 lb 14.7 oz (61.2 kg)  11/07/11 115 lb 12.8 oz (52.527 kg)  09/23/11 127 lb (57.607 kg)  05/10/11 110 lb (49.896 kg)    Usual Body Weight: 139 lb one month ago  % Usual Body Weight: 95%  BMI:  Body mass index is 21.43 kg/(m^2).  Estimated Nutritional Needs: Kcal: 1500-1800 Protein: 85-95 gm Fluid: 1.6-1.8 L  Skin: WDL  Diet Order: Dysphagia 1 with honey thick liquids  EDUCATION NEEDS: -Education needs addressed   Intake/Output Summary (Last 24 hours) at 04/07/14 1355 Last data filed at 04/06/14 2221  Gross per 24 hour  Intake   1000 ml  Output    200 ml  Net    800 ml    Last BM: None documented since admission   Labs:   Recent Labs Lab 04/06/14 1853 04/06/14 2254 04/07/14 0500  NA 141  --  140  K 3.9  --  3.7  CL 103  --  105  CO2 25  --  26  BUN 16  --  14  CREATININE 0.68 0.56 0.63  CALCIUM 8.6  --  8.2*  GLUCOSE 80  --  85    CBG (last 3)  No results found for this basename: GLUCAP,  in the last 72 hours  Scheduled Meds: .  aspirin EC  81 mg Oral Daily  . atropine  3-4 drop Sublingual TID  . ceFEPime (MAXIPIME) IV  1 g Intravenous Q12H  . clonazePAM  1 mg Oral QHS  . digoxin  0.125 mg Oral Daily  . [START ON 04/14/2014] estrogens (conjugated)  0.3 mg Oral Daily  . feeding supplement (ENSURE COMPLETE)  120 mL Oral BID BM  . FLUoxetine  20 mg Oral Daily  . haloperidol  0.5 mg Oral BID  . heparin  5,000 Units Subcutaneous 3 times per day  . levothyroxine  75 mcg Oral QAC breakfast  . pantoprazole  40 mg Oral Daily  . senna-docusate  2 tablet Oral QHS  . vancomycin  500 mg Intravenous Q12H     Continuous Infusions: . sodium chloride 50 mL/hr at 04/06/14 2225    Past Medical History  Diagnosis Date  . Huntington disease   . Atrial fibrillation   . Chronic respiratory failure     nocturnal oxygen  . Complication of anesthesia     felt surgery with epidural  . GERD (gastroesophageal reflux disease)   . Thyroid disease   . Pneumonia 04/07/2014    Past Surgical History  Procedure Laterality Date  . Colon surgery    . Abdominal hysterectomy    . Hemorrhoid surgery    . Tonsillectomy    . Appendectomy    . Cholecystectomy  11/28/2011    Procedure: LAPAROSCOPIC CHOLECYSTECTOMY;  Surgeon: Emelia Loron, MD;  Location: [facility];  Service: General;  Laterality: N/A;    Joaquin Courts, RD, LDN, CNSC Pager [phone] After Hours Pager [phone]

## 2014-04-07 NOTE — Progress Notes (Signed)
Inpatient RN visit- Sydney [name] [facility] 6N Room 13 -[facility] RN Visit-Karen Merilynn Finland RN  Related admission to [facility] diagnosis of Huntington's Dz. Pt is DNR  code.   Pt alert and interactive lying in bed, staff med tech in giving bath. Pt appeared in good sprits, speech very difficult to understand. Writer spoke with Cg Fannie Knee outside of the room. Fannie Knee relayed the events that led her to call EMS for pt. Per Fannie Knee and chart review pt is recvg IV abx for treatment of PNA. Speech eval completed and pt is now on pureed diet and honey think liquids. This is a change from her previous diet. At home she does recv atropine gtts for management of her oral secretions.  [facility] will continue to follow through discharge.  Patient's home medication list is on shadow chart.  Please call [facility] @ [phone] with any hospice needs.   Thank you. Hansel Starling, RN  [facility]  Hospice Liaison  [phone])

## 2014-04-07 NOTE — Progress Notes (Signed)
[facility] and Social Worker Elijio Miles: Pt awake in bed, alert and pleasant, humorous at times, but fatigued overall, joined by cg/POA Fannie Knee.  Pt recognized chaplain and SW, but speech difficult to understand, even to point Fannie Knee not able to understand pt responses.  Speech tx in earlier for eval, and returned to room to post sign on wall about honey thick and pureed consistency, and that Fannie Knee understood that pt's chronic and silent aspiration process will continue to get worse.  Pt's minister Onalee Hua in earlier for visit, and Fannie Knee also open about recent family losses. Plan is to return home at hospital discharge. Discussed case with Clydie Braun, RN liaison. Offered support and active listening.  Lovenia Shuck, ThM, [facility] [place]

## 2014-04-07 NOTE — Progress Notes (Signed)
Utilization review completed.  

## 2014-04-07 NOTE — Evaluation (Signed)
Clinical/Bedside Swallow Evaluation Patient Details  Name: Sydney [name] MRN: [id number] Date of Birth: [birth year]  Today's Date: 04/07/2014 Time: 9629-5284 SLP Time Calculation (min): 32 min  Past Medical History:  Past Medical History  Diagnosis Date  . Huntington disease   . Atrial fibrillation   . Chronic respiratory failure     nocturnal oxygen  . Complication of anesthesia     felt surgery with epidural  . GERD (gastroesophageal reflux disease)   . Thyroid disease   . Pneumonia 04/07/2014   Past Surgical History:  Past Surgical History  Procedure Laterality Date  . Colon surgery    . Abdominal hysterectomy    . Hemorrhoid surgery    . Tonsillectomy    . Appendectomy    . Cholecystectomy  11/28/2011    Procedure: LAPAROSCOPIC CHOLECYSTECTOMY;  Surgeon: Emelia Loron, MD;  Location: [facility];  Service: General;  Laterality: N/A;   HPI:  79 year old female with PMH of Huntington's disease, chronic a-fib, hypothyroidism, admitted with NCAP, likely secondary to aspiration event which is a recurrent issue. Diagnosed with dysphagia and discharged after recent admission for aspiration PNA on a dysphagia 1 diet with nectar- thick liquids. Caregiver witnessed aspiration epsiode on day of admission where patient had difficulty breathing after eating, requiring 2 heimlich manuevers.    Assessment / Plan / Recommendation Clinical Impression  Bedside swallow evaluation complete. Patient with somewhat consistent performance from swallow evaluation complete on previous admission in which pt demonstrates signs of suspected silent aspiration characterized by wet vocal quality post swallow, likely due to decreased oral coordination and and dealyed swallow reponse as a result of progressive neuromuscular disease. SLP manipulated bolus size and presentation including use of teaspoon, cup sips, and straw with decrease in signs. More consistent wet vocal quality noted with nectar thick as  compared to honey thick liquids and pureed solids. Cueing for throat clear and/or cough ineffective to clear suspected penetration and/or aspiration. Caregiver present. SLP discussed suspicion of chronic aspiration given diagnosis, perfomance today, and recurrent PNA episodes. Doubt that instrumental testing would provide any additional assistance. Patient and caregiver verbalized understanding and wish to continue pos with known risk of aspiration. Modified diet will not prevent aspriation, but it may reduce severity of aspiration events. Provided instruction regarding recommended diet consistencies including use of pureed solids to decrease fatigue factor during meals. Will f/u for further education as needed.     Aspiration Risk  Severe    Diet Recommendation Dysphagia 1 (Puree);Honey-thick liquid   Liquid Administration via: Cup;Straw;Spoon Medication Administration: Crushed with puree Supervision: Full supervision/cueing for compensatory strategies;Trained caregiver to feed patient Compensations: Slow rate;Small sips/bites Postural Changes and/or Swallow Maneuvers: Seated upright 90 degrees    Other  Recommendations Oral Care Recommendations: Oral care BID Other Recommendations: Order thickener from pharmacy;Prohibited food (jello, ice cream, thin soups);Remove water pitcher   Follow Up Recommendations  None    Frequency and Duration min 1 x/week  1 week   Pertinent Vitals/Pain n/a        Swallow Study    General HPI: 79 year old female with PMH of Huntington's disease, chronic a-fib, hypothyroidism, admitted with NCAP, likely secondary to aspiration event which is a recurrent issue. Diagnosed with dysphagia and discharged after recent admission for aspiration PNA on a dysphagia 1 diet with nectar- thick liquids. Caregiver witnessed aspiration epsiode on day of admission where patient had difficulty breathing after eating, requiring 2 heimlich manuevers.  Type of Study: Bedside  swallow evaluation  Previous Swallow Assessment: see HPI Diet Prior to this Study: Dysphagia 3 (soft);Honey-thick liquids Temperature Spikes Noted: No Respiratory Status: Nasal cannula History of Recent Intubation: No Behavior/Cognition: Alert;Cooperative;Pleasant mood Oral Cavity - Dentition: Poor condition;Missing dentition Self-Feeding Abilities: Total assist Patient Positioning: Upright in chair Baseline Vocal Quality: Clear;Low vocal intensity Volitional Cough: Other (Comment) (unable to elicit) Volitional Swallow: Unable to elicit    Oral/Motor/Sensory Function Overall Oral Motor/Sensory Function: Impaired (discoordination)   Ice Chips Ice chips: Not tested   Thin Liquid Thin Liquid: Not tested    Nectar Thick Nectar Thick Liquid: Impaired Presentation: Cup;Spoon;Straw Oral Phase Impairments: Impaired anterior to posterior transit;Reduced lingual movement/coordination Pharyngeal Phase Impairments: Suspected delayed Swallow;Decreased hyoid-laryngeal movement;Multiple swallows;Wet Vocal Quality   Honey Thick Honey Thick Liquid: Impaired Oral Phase Impairments: Reduced lingual movement/coordination Pharyngeal Phase Impairments: Suspected delayed Swallow;Decreased hyoid-laryngeal movement;Multiple swallows;Wet Vocal Quality   Puree Puree: Impaired Presentation: Spoon Oral Phase Impairments: Reduced lingual movement/coordination Pharyngeal Phase Impairments: Suspected delayed Swallow;Decreased hyoid-laryngeal movement;Multiple swallows;Wet Vocal Quality   Solid   GO Functional Assessment Tool Used: skilled clinical judgement Functional Limitations: Swallowing Swallow Current Status (O1308): At least 60 percent but less than 80 percent impaired, limited or restricted Swallow Goal Status [phone]): At least 60 percent but less than 80 percent impaired, limited or restricted Swallow Discharge Status [phone]): At least 60 percent but less than 80 percent impaired, limited or restricted   Solid: Not tested      Ferdinand Lango MA, CCC-SLP [phone]  Sydney [name] 04/07/2014,11:50 AM

## 2014-04-08 LAB — BASIC METABOLIC PANEL
Anion gap: 11 (ref 5–15)
BUN: 10 mg/dL (ref 6–23)
CO2: 26 mEq/L (ref 19–32)
CREATININE: 0.65 mg/dL (ref 0.50–1.10)
Calcium: 8.9 mg/dL (ref 8.4–10.5)
Chloride: 104 mEq/L (ref 96–112)
GFR calc non Af Amer: 83 mL/min — ABNORMAL LOW (ref >90)
Glucose, Bld: 88 mg/dL (ref 70–99)
Potassium: 4.1 mEq/L (ref 3.7–5.3)
Sodium: 141 mEq/L (ref 137–147)

## 2014-04-08 LAB — CBC
HEMATOCRIT: 37.1 % (ref 36.0–46.0)
Hemoglobin: 11.8 g/dL — ABNORMAL LOW (ref 12.0–15.0)
MCH: 29 pg (ref 26.0–34.0)
MCHC: 31.8 g/dL (ref 30.0–36.0)
MCV: 91.2 fL (ref 78.0–100.0)
PLATELETS: 220 10*3/uL (ref 150–400)
RBC: 4.07 MIL/uL (ref 3.87–5.11)
RDW: 13.6 % (ref 11.5–15.5)
WBC: 7.9 10*3/uL (ref 4.0–10.5)

## 2014-04-08 MED ORDER — RESOURCE THICKENUP CLEAR PO POWD
ORAL | Status: DC | PRN
  Filled 2014-04-08: qty 125

## 2014-04-08 NOTE — Progress Notes (Addendum)
PT Cancellation Note  Patient Details Name: Sydney [name] MRN: [id number] DOB: 09-30-1934   Cancelled Treatment:    Reason Eval/Treat Not Completed: Other (comment). Met with caregiver in room. Family has all equipment needs including but not limited to hoyer lift, hospital bed, wheelchair; chair lift. Discussed importance of ROM for prevention of contractures and pain. Family appreciative. No acute PT needs warranted. Pt planned to go home with hospice.    Elie Confer [place], [place] 04/08/2014, 2:15 PM

## 2014-04-08 NOTE — Progress Notes (Signed)
GIP visit- [name] [facility] 1O10 -[name] RN   Related admission to [facility] diagnosis of Huntington's Dz. Pt is DNR code. Entered patients room and greeted daughter and other visitor and patient awoke to writers voice. Asked patient how she was feeling and she said good. Spoke with daughter about MD visit and continuing to treat the aspiration pneumonia. Daughter stated that as long as patient alert and able to make needs known that she would continue to treat. Daughter aware of prognosis and is coping appropriately. Daughter asked for MD to be paged if available and asked for thicket packets. Notified nurse Darla and she will take care of those requests.   Please call [facility] @ [phone] with any hospice needs.  Thank you Ulis Rias RN

## 2014-04-08 NOTE — Progress Notes (Signed)
Patient ID: Sydney [name], female   DOB: 1935-03-10, 79 y.o.   MRN: [id number]  [facility] PROGRESS NOTE  Sydney [name] UYQ:[id number] DOB: 1934-10-31 DOA: 04/06/2014 PCP: Ginette Otto, MD  Brief narrative:  79 y.o. 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 Afib presenting progressive weakness, failure to thrive, poor oral intake, dyspnea, non productive cough. Pt noted to have been admitted 2-3 weeks ago for aspiration PNA. Pt was discharged on a 3 day course of oral antibiotics. Per caregiver/HCPOA, pt has had recurrent cough and? Aspiration events at home.   On arrival to the ER, pt was afebrile, hemodynamically stable. HR 70s-80s. Resp 10a-20s. BP 110s-130s. Satting >93% on RA. CBC and BMET WNL. CXR shows Persistent airspace disease in left retrocardiac lung base, suspicious for pneumonia. Started on vanc and cefepime by EDP.  Assessment and Plan:   Active Problems:  HCAP (healthcare-associated pneumonia)  - pt clinically stable this AM And reports feeling better  - she is more alert but very difficult to understand her speech - denies shortness of breath and chest pain  - continue Vancomycin and Maxipime day #3/7, possible transition to oral ABX in AM - provide oxygen as needed  - SLP done, dys I diet recommended  Severe PCM  - secondary to progressive nature of the illness  - advance diet to dys I as recommended  Huntington's disease  - PT/OT pending to determine appropriate D/C plan  DVT prophylaxis  Heparin SQ while pt is in hospital  Code Status: DNR  Family Communication: No daughter at bedside this AM Disposition Plan: Remains inpatient   IV Access:   Peripheral IV Procedures and diagnostic studies:   Dg Chest 2 View 04/07/2014 No acute cardiopulmonary disease.  Dg Chest Port 1 View 04/06/2014 Persistent airspace disease in left retrocardiac lung base, suspicious for pneumonia.  Medical Consultants:    None  Other Consultants:   Physical therapy  SLP Anti-Infectives:   Maxipime 9/10 -->  Vancomycin 9/10 -->  Sydney Presto, MD  [name] Pager [phone]  If 7PM-7AM, please contact night-coverage [email or web address] Password  [name] 04/08/2014, 10:17 AM   LOS: 2 days   HPI/Subjective: No events overnight.   Objective: Filed Vitals:   04/07/14 0938 04/07/14 1409 04/07/14 2203 04/08/14 0515  BP:  110/64 126/62 130/59  Pulse: 72 73 89 86  Temp:  97.8 F (36.6 C) 98.7 F (37.1 C) 98.6 F (37 C)  TempSrc:  Oral Oral Oral  Resp:  Height:      Weight:      SpO2:  99% 97% 95%    Intake/Output Summary (Last 24 hours) at 04/08/14 1017 Last data filed at 04/08/14 0948  Gross per 24 hour  Intake 1719.17 ml  Output      0 ml  Net 1719.17 ml    Exam:   General:  Pt is alert, chorea noted, difficult to understand speech   Cardiovascular: Regular rate and rhythm, no rubs, no gallops  Respiratory: Clear to auscultation bilaterally, no wheezing, diminished breath sounds at bases   Abdomen: Soft, non tender, non distended, bowel sounds present, no guarding  Extremities: No edema, pulses DP and PT palpable bilaterally  Data Reviewed: Basic Metabolic Panel:  Recent Labs Lab 04/06/14 1853 04/06/14 2254 04/07/14 0500 04/08/14 0730  NA 141  --  140 141  K 3.9  --  3.7 4.1  CL 103  --  105 104  CO2  25  --  26 26  GLUCOSE 80  --  85 88  BUN 16  --  14 10  CREATININE 0.68 0.56 0.63 0.65  CALCIUM 8.6  --  8.2* 8.9   Liver Function Tests:  Recent Labs Lab 04/06/14 1853 04/07/14 0500  AST 9 7  ALT <5 <5  ALKPHOS 101 96  BILITOT 0.2* 0.3  PROT 6.8 6.0  ALBUMIN 2.8* 2.5*   CBC:  Recent Labs Lab 04/06/14 1853 04/06/14 2254 04/07/14 0500 04/08/14 0730  WBC 9.4 9.0 8.7 7.9  NEUTROABS 5.8  --  4.8  --   HGB 13.1 12.4 12.0 11.8*  HCT 39.9 37.3 36.8 37.1  MCV 88.7 88.2 89.1 91.2  PLT 251 232 237 220   Recent Results (from the past 240 hour(s))   GRAM STAIN     Status: None   Collection Time    04/06/14 10:20 PM      Result Value Ref Range Status   Specimen Description     Final   Value: URINE, RANDOM CONFIRMED WITH KOKO Prince Solian [id number] [name] M   Special Requests NONE   Final   Gram Stain     Final   Value: CYTOSPUN     WBC PRESENT, PREDOMINANTLY MONONUCLEAR     NO ORGANISMS SEEN   Report Status 04/06/2014 FINAL   Final  MRSA PCR SCREENING     Status: Abnormal   Collection Time    04/07/14  7:43 AM      Result Value Ref Range Status   MRSA by PCR POSITIVE (*) NEGATIVE Final   Comment:            The GeneXpert MRSA Assay (FDA     approved for NASAL specimens     only), is one component of a     comprehensive MRSA colonization     surveillance program. It is not     intended to diagnose MRSA     infection nor to guide or     monitor treatment for     MRSA infections.     RESULT CALLED TO, READ BACK BY AND VERIFIED WITH:     THOMPSON RN 10:15 04/07/14 ([email or web address])     Scheduled Meds: . aspirin EC  81 mg Oral Daily  . atropine  3-4 drop Sublingual TID  . ceFEPime (MAXIPIME) IV  1 g Intravenous Q12H  . clonazePAM  1 mg Oral QHS  . digoxin  0.125 mg Oral Daily  . [START ON 04/14/2014] estrogens (conjugated)  0.3 mg Oral Daily  . feeding supplement (ENSURE COMPLETE)  120 mL Oral BID BM  . feeding supplement (ENSURE)  1 Container Oral TID BM  . FLUoxetine  20 mg Oral Daily  . haloperidol  0.5 mg Oral BID  . heparin  5,000 Units Subcutaneous 3 times per day  . levothyroxine  75 mcg Oral QAC breakfast  . pantoprazole  40 mg Oral Daily  . senna-docusate  2 tablet Oral QHS  . vancomycin  500 mg Intravenous Q12H   Continuous Infusions: . sodium chloride 50 mL/hr at 04/07/14 2330

## 2014-04-09 NOTE — Progress Notes (Signed)
ANTIBIOTIC CONSULT NOTE - FOLLOW UP  Pharmacy Consult for Vancomycin Indication: pneumonia  Allergies  Allergen Reactions  . Codeine Itching  . Penicillins Cross Reactors Hives    Patient Measurements: Height:  (167.6 cm) Weight: 132 lb 11.5 oz (60.2 kg) IBW/kg (Calculated) : 59.3 Adjusted Body Weight:    Vital Signs: Temp: 97.8 F (36.6 C) (09/13 1305) Temp src: Oral (09/13 1305) BP: 137/79 mmHg (09/13 1305) Pulse Rate: 90 (09/13 1305) Intake/Output from previous day: 09/12 0701 - 09/13 0700 In: 140 [P.O.:140] Out: -  Intake/Output from this shift: Total I/O In: 336 [P.O.:336] Out: -   Labs:  Recent Labs  04/06/14 2254 04/07/14 0500 04/08/14 0730  WBC 9.0 8.7 7.9  HGB 12.4 12.0 11.8*  PLT 232 237 220  CREATININE 0.56 0.63 0.65   Estimated Creatinine Clearance: 55.1 ml/min (by C-G formula based on Cr of 0.65). No results found for this basename: VANCOTROUGH, Leodis Binet, VANCORANDOM, GENTTROUGH, GENTPEAK, GENTRANDOM, TOBRATROUGH, TOBRAPEAK, TOBRARND, AMIKACINPEAK, AMIKACINTROU, AMIKACIN,  in the last 72 hours   Microbiology: Recent Results (from the past 720 hour(s))  CULTURE, BLOOD (ROUTINE X 2)     Status: None   Collection Time    04/06/14  9:29 PM      Result Value Ref Range Status   Specimen Description BLOOD LEFT WRIST   Final   Special Requests     Final   Value: BOTTLES DRAWN AEROBIC AND ANAEROBIC BLUE 4 CC RED 5 CC   Culture  Setup Time     Final   Value: 04/07/2014 04:48     Performed at [facility]   Culture     Final   Value:        BLOOD CULTURE RECEIVED NO GROWTH TO DATE CULTURE WILL BE HELD FOR 5 DAYS BEFORE ISSUING A FINAL NEGATIVE REPORT     Performed at [facility]   Report Status PENDING   Incomplete  CULTURE, BLOOD (ROUTINE X 2)     Status: None   Collection Time    04/06/14  9:36 PM      Result Value Ref Range Status   Specimen Description BLOOD RIGHT WRIST   Final   Special Requests BOTTLES DRAWN AEROBIC  AND ANAEROBIC 5 CC   Final   Culture  Setup Time     Final   Value: 04/07/2014 04:48     Performed at [facility]   Culture     Final   Value:        BLOOD CULTURE RECEIVED NO GROWTH TO DATE CULTURE WILL BE HELD FOR 5 DAYS BEFORE ISSUING A FINAL NEGATIVE REPORT     Performed at [facility]   Report Status PENDING   Incomplete  GRAM STAIN     Status: None   Collection Time    04/06/14 10:20 PM      Result Value Ref Range Status   Specimen Description     Final   Value: URINE, RANDOM CONFIRMED WITH KOKO AJALA,RN [id number] [id number] [facility] M   Special Requests NONE   Final   Gram Stain     Final   Value: CYTOSPUN     WBC PRESENT, PREDOMINANTLY MONONUCLEAR     NO ORGANISMS SEEN   Report Status 04/06/2014 FINAL   Final  MRSA PCR SCREENING     Status: Abnormal   Collection Time    04/07/14  7:43 AM      Result Value Ref Range Status  MRSA by PCR POSITIVE (*) NEGATIVE Final   Comment:            The GeneXpert MRSA Assay (FDA     approved for NASAL specimens     only), is one component of a     comprehensive MRSA colonization     surveillance program. It is not     intended to diagnose MRSA     infection nor to guide or     monitor treatment for     MRSA infections.     RESULT CALLED TO, READ BACK BY AND VERIFIED WITH:     THOMPSON RN 10:15 04/07/14 ([email or web address])    Medical History: Past Medical History  Diagnosis Date  . Huntington disease   . Atrial fibrillation   . Chronic respiratory failure     nocturnal oxygen  . Complication of anesthesia     felt surgery with epidural  . GERD (gastroesophageal reflux disease)   . Thyroid disease   . Pneumonia 04/07/2014    Medications:  See med rec  Assessment: SOB 79 y/o F presents with c/o SOB from ongoing PNA (recent hospitalization 8/15). She is currently on empiric Vancomycin and Cefepime. Patient remains afebrile with normal wbc. She reports feeling better. SCr remains stable.   Goal of Therapy:  Vancomycin  trough level 15-20 mcg/ml  Plan:  Cefepime 1g IV q12h per MD  Vancomycin  IV q12h.  Hold off on Vanc trough since plan is to likely transition to PO abx tomorrow morning     Vinnie Level, PharmD.  Clinical Pharmacist Pager [phone]

## 2014-04-09 NOTE — Progress Notes (Signed)
[facility] PROGRESS NOTE  Sydney [name] ZOX:[id number] DOB: 25-Feb-1935 DOA: 04/06/2014 PCP: Ginette Otto, MD  Assessment/Plan: Active Problems:   HCAP (healthcare-associated pneumonia)   Malnutrition of moderate degree   Brief narrative:  79 y.o. 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 Afib presenting progressive weakness, failure to thrive, poor oral intake, dyspnea, non productive cough. Pt noted to have been admitted 2-3 weeks ago for aspiration PNA. Pt was discharged on a 3 day course of oral antibiotics. Per caregiver/HCPOA, pt has had recurrent cough and? Aspiration events at home.  On arrival to the ER, pt was afebrile, hemodynamically stable. HR 70s-80s. Resp 10a-20s. BP 110s-130s. Satting >93% on RA. CBC and BMET WNL. CXR shows Persistent airspace disease in left retrocardiac lung base, suspicious for pneumonia. Started on vanc and cefepime by EDP.   Assessment and Plan:   Active Problems:  HCAP (healthcare-associated pneumonia)  - pt clinically stable this AM And reports feeling better  - she is more alert but very difficult to understand her speech  - denies shortness of breath and chest pain  - continue Vancomycin and Maxipime day #3/7, possible transition to oral ABX in AM  - provide oxygen as needed  - SLP done, dys I diet recommended  Severe PCM  - secondary to progressive nature of the illness  - advance diet to dys I as recommended   Huntington's disease  - PT/OT recommend continuation of hoyer lift, hospital bed, wheelchair; chair lift  DVT prophylaxis  Heparin SQ while pt is in hospital   Code Status: DNR  Family Communication: No daughter at bedside this AM  Disposition Plan likely home tomorrow with hospice  IV Access:   Peripheral IV Procedures and diagnostic studies:   Dg Chest 2 View 04/07/2014 No acute cardiopulmonary disease.  Dg Chest Port 1 View 04/06/2014 Persistent airspace  disease in left retrocardiac lung base, suspicious for pneumonia.  Medical Consultants:   None  Other Consultants:   Physical therapy  SLP Anti-Infectives:   Maxipime 9/10 -->  Vancomycin 9/10 -->    HPI/Subjective: Patient struggling with her diet unable to tolerate thickened liquids, afebrile overnight  Objective: Filed Vitals:   04/08/14 0515 04/08/14 1413 04/08/14 2300 04/09/14 0700  BP: 130/59 133/77 143/70 143/65  Pulse: 86 91 79 63  Temp: 98.6 F (37 C) 97 F (36.1 C) 98.7 F (37.1 C) 97.3 F (36.3 C)  TempSrc: Oral Oral Oral Oral  Resp: Height:      Weight:      SpO2: 95% 97% 94% 95%    Intake/Output Summary (Last 24 hours) at 04/09/14 1135 Last data filed at 04/09/14 0916  Gross per 24 hour  Intake    236 ml  Output      0 ml  Net    236 ml    Exam:  General: alert & oriented x 3 In NAD  Cardiovascular: RRR, nl S1 s2  Respiratory: Decreased breath sounds at the bases, scattered rhonchi, no crackles  Abdomen: soft +BS NT/ND, no masses palpable  Extremities: No cyanosis and no edema      Data Reviewed: Basic Metabolic Panel:  Recent Labs Lab 04/06/14 1853 04/06/14 2254 04/07/14 0500 04/08/14 0730  NA 141  --  140 141  K 3.9  --  3.7 4.1  CL 103  --  105 104  CO2 25  --  26 26  GLUCOSE 80  --  85 88  BUN 16  --  14 10  CREATININE 0.68 0.56 0.63 0.65  CALCIUM 8.6  --  8.2* 8.9    Liver Function Tests:  Recent Labs Lab 04/06/14 1853 04/07/14 0500  AST 9 7  ALT <5 <5  ALKPHOS 101 96  BILITOT 0.2* 0.3  PROT 6.8 6.0  ALBUMIN 2.8* 2.5*   No results found for this basename: LIPASE, AMYLASE,  in the last 168 hours No results found for this basename: AMMONIA,  in the last 168 hours  CBC:  Recent Labs Lab 04/06/14 1853 04/06/14 2254 04/07/14 0500 04/08/14 0730  WBC 9.4 9.0 8.7 7.9  NEUTROABS 5.8  --  4.8  --   HGB 13.1 12.4 12.0 11.8*  HCT 39.9 37.3 36.8 37.1  MCV 88.7 88.2 89.1 91.2  PLT 251 232 237 220     Cardiac Enzymes: No results found for this basename: CKTOTAL, CKMB, CKMBINDEX, TROPONINI,  in the last 168 hours BNP (last 3 results)  Recent Labs  04/06/14 1853  PROBNP 163.0     CBG: No results found for this basename: GLUCAP,  in the last 168 hours  Recent Results (from the past 240 hour(s))  CULTURE, BLOOD (ROUTINE X 2)     Status: None   Collection Time    04/06/14  9:29 PM      Result Value Ref Range Status   Specimen Description BLOOD LEFT WRIST   Final   Special Requests     Final   Value: BOTTLES DRAWN AEROBIC AND ANAEROBIC BLUE 4 CC RED 5 CC   Culture  Setup Time     Final   Value: 04/07/2014 04:48     Performed at [facility]   Culture     Final   Value:        BLOOD CULTURE RECEIVED NO GROWTH TO DATE CULTURE WILL BE HELD FOR 5 DAYS BEFORE ISSUING A FINAL NEGATIVE REPORT     Performed at [facility]   Report Status PENDING   Incomplete  CULTURE, BLOOD (ROUTINE X 2)     Status: None   Collection Time    04/06/14  9:36 PM      Result Value Ref Range Status   Specimen Description BLOOD RIGHT WRIST   Final   Special Requests BOTTLES DRAWN AEROBIC AND ANAEROBIC 5 CC   Final   Culture  Setup Time     Final   Value: 04/07/2014 04:48     Performed at [facility]   Culture     Final   Value:        BLOOD CULTURE RECEIVED NO GROWTH TO DATE CULTURE WILL BE HELD FOR 5 DAYS BEFORE ISSUING A FINAL NEGATIVE REPORT     Performed at [facility]   Report Status PENDING   Incomplete  GRAM STAIN     Status: None   Collection Time    04/06/14 10:20 PM      Result Value Ref Range Status   Specimen Description     Final   Value: URINE, RANDOM CONFIRMED WITH KOKO AJALA,RN [id number] [facility] M   Special Requests NONE   Final   Gram Stain     Final   Value: CYTOSPUN     WBC PRESENT, PREDOMINANTLY MONONUCLEAR     NO ORGANISMS SEEN   Report Status 04/06/2014 FINAL   Final  MRSA PCR SCREENING     Status: Abnormal   Collection Time  04/07/14  7:43 AM      Result Value Ref Range Status   MRSA by PCR POSITIVE (*) NEGATIVE Final   Comment:            The GeneXpert MRSA Assay (FDA     approved for NASAL specimens     only), is one component of a     comprehensive MRSA colonization     surveillance program. It is not     intended to diagnose MRSA     infection nor to guide or     monitor treatment for     MRSA infections.     RESULT CALLED TO, READ BACK BY AND VERIFIED WITH:     THOMPSON RN 10:15 04/07/14 ([email or web address])     Studies: Dg Chest 2 View  04/07/2014   CLINICAL DATA:  Shortness of breath  EXAM: CHEST  2 VIEW  COMPARISON:  04/06/2014; 03/08/2014; 10/01/2011; chest CT - 10/12/2011  FINDINGS: Grossly unchanged borderline enlarged cardiac silhouette. Normal mediastinal contours. Overall improved aeration of the lungs. No focal airspace opacities. No pleural effusion or pneumothorax. No evidence of edema. No acute osseus abnormalities.  IMPRESSION: No acute cardiopulmonary disease.   Electronically Signed   By: Simonne Come M.D.   On: 04/07/2014 08:50   Dg Chest Port 1 View  04/06/2014   CLINICAL DATA:  Dyspnea. Aspiration pneumonia. Huntington's disease.  EXAM: PORTABLE CHEST - 1 VIEW  COMPARISON:  03/08/2014  FINDINGS: Persistent airspace opacity is seen in the retrocardiac left lower lobe. Right lung remains clear. No evidence of pleural effusion. Heart size is stable.  IMPRESSION: Persistent airspace disease in left retrocardiac lung base, suspicious for pneumonia.   Electronically Signed   By: Myles Rosenthal M.D.   On: 04/06/2014 18:48    Scheduled Meds: . aspirin EC  81 mg Oral Daily  . atropine  3-4 drop Sublingual TID  . ceFEPime (MAXIPIME) IV  1 g Intravenous Q12H  . clonazePAM  1 mg Oral QHS  . digoxin  0.125 mg Oral Daily  . [START ON 04/14/2014] estrogens (conjugated)  0.3 mg Oral Daily  . feeding supplement (ENSURE COMPLETE)  120 mL Oral BID BM  . feeding supplement (ENSURE)  1 Container Oral TID BM  .  FLUoxetine  20 mg Oral Daily  . haloperidol  0.5 mg Oral BID  . heparin  5,000 Units Subcutaneous 3 times per day  . levothyroxine  75 mcg Oral QAC breakfast  . pantoprazole  40 mg Oral Daily  . senna-docusate  2 tablet Oral QHS  . vancomycin  500 mg Intravenous Q12H   Continuous Infusions: . sodium chloride 50 mL/hr at 04/07/14 2330    Active Problems:   HCAP (healthcare-associated pneumonia)   Malnutrition of moderate degree    Time spent: 40 minutes   [facility]  [facility] Pager [phone]. If 7PM-7AM, please contact night-coverage at [email or web address], password [facility] 04/09/2014, 11:35 AM  LOS: 3 days

## 2014-04-09 NOTE — Progress Notes (Signed)
GIP visit- Sydney Chinchilla [facility] [id number] -[name] RN   Related admission to [facility] diagnosis of Huntington's Dz. Pt is DNR code. Patient sleeping on visit. Spoke with daughter she stated that patient ate about 75% of her breakfast fed to her by the nurse Darla. Daughter had concerns about the thickened milk and that it was so thick that it would not come out of the container. Daughter requesting tea for the patient because thicket is at bedside. Darla notified and will get patient tea for her meals. No acute distress noted patient is still getting antibiotics.   Please call [facility] @ [phone] with any hospice needs.  Thank you Ulis Rias RN

## 2014-04-09 NOTE — Plan of Care (Signed)
Problem: Phase II Progression Outcomes Goal: Obtain order to discontinue catheter if appropriate Outcome: Not Applicable Date Met:  89/38/10 Patient incontinent of urine.

## 2014-04-10 LAB — BASIC METABOLIC PANEL
Anion gap: 12 (ref 5–15)
BUN: 8 mg/dL (ref 6–23)
CO2: 26 meq/L (ref 19–32)
CREATININE: 0.59 mg/dL (ref 0.50–1.10)
Calcium: 8.9 mg/dL (ref 8.4–10.5)
Chloride: 103 mEq/L (ref 96–112)
GFR calc non Af Amer: 86 mL/min — ABNORMAL LOW (ref >90)
Glucose, Bld: 106 mg/dL — ABNORMAL HIGH (ref 70–99)
Potassium: 3.7 mEq/L (ref 3.7–5.3)
Sodium: 141 mEq/L (ref 137–147)

## 2014-04-10 MED ORDER — STARCH (THICKENING) PO POWD: ORAL | Status: DC

## 2014-04-10 MED ORDER — ENSURE PUDDING PO PUDG: 1.0000 | Freq: Three times a day (TID) | ORAL | Status: DC

## 2014-04-10 MED ORDER — POLYETHYLENE GLYCOL 3350 17 G PO PACK: 17.0000 g | PACK | Freq: Every day | ORAL | Status: DC

## 2014-04-10 MED ORDER — MORPHINE SULFATE (CONCENTRATE) 20 MG/ML PO SOLN: 5.0000 mg | ORAL | Status: DC | PRN

## 2014-04-10 MED ORDER — LEVOFLOXACIN 500 MG PO TABS: 500.0000 mg | ORAL_TABLET | Freq: Every day | ORAL | Status: AC

## 2014-04-10 MED ORDER — RESOURCE THICKENUP CLEAR PO POWD: ORAL | Status: DC

## 2014-04-10 MED ORDER — LEVOFLOXACIN 500 MG PO TABS: 500.0000 mg | ORAL_TABLET | Freq: Every day | ORAL | Status: DC

## 2014-04-10 MED ORDER — ALPRAZOLAM 0.25 MG PO TABS: 0.2500 mg | ORAL_TABLET | Freq: Two times a day (BID) | ORAL | Status: DC | PRN

## 2014-04-10 NOTE — Progress Notes (Signed)
[facility] MSW note: This is a hospice related admission. Pt is a DNR. Pt asleep upon arrival but easily awaken. Pt denied pain or discomfort. Speech difficult to understand. No caregiver present. MSW present when Dr. Susie Cassette came to assess pt. Pt feeling better. Pt being discharge from hospital and back home today. Pt will have continued [facility] services at home. MSW discussed case with Dr. Susie Cassette, staff RN-Zee, staff nurse tech-Tonia, and social worker-Jenna. Hospital social worker agreed to arrange transport home. MSW called and informed [name]/caregiver/friend of hospital discharge today. Caregiver agreed to bring pt clothes and brief to go home in. MSW informed Clydie Braun, RN hospital liaison and home care team of discharge home today.   Sydney [name], MSW

## 2014-04-10 NOTE — Progress Notes (Signed)
[facility] called to follow up on transport and they stated that patient is on the list.

## 2014-04-10 NOTE — Clinical Social Work Note (Signed)
Patient will discharge to home Anticipated discharge date: 04/10/14 Family notified: Family at bedside Transportation by [facility]- called and scheduled pick up for 5pm  CSW spoke with patient nurse who stated that patient would not be ready for pick up until 5pm due to new home health equipment needs.  Merlyn Lot, LCSWA Clinical Social Worker [phone]

## 2014-04-10 NOTE — Progress Notes (Signed)
Discharge instruction with prescription given to caregiver, Fannie Knee. Instructed that SW called [facility] for transport . Caregiver verbalized understanding with the discharge instruction.

## 2014-04-10 NOTE — Progress Notes (Signed)
Speech Language Pathology Treatment:    Patient Details Name: Sydney [name] MRN: [id number] DOB: 09-08-1934 Today's Date: 04/10/2014 Time: 4098-1191 SLP Time Calculation (min): 30 min  Assessment / Plan / Recommendation Clinical Impression  SLP provided f/u check of pts diet tolerance and reinforced strategies. Caregiver reported ongoing difficulty swallowing with wet respirations following honey thick liquids and increased difficulty with thicker texture. SLP provided trials of thin liquids and nectar thick liquids to again check for evidence of aspiration. Pt with consistently delayed wet vocal quality following trials of both textures. Given reports from caregiver of increased cough at the end of meals, difficulty swallowing secretions and improved function with warm liquids, concerned for possible esophageal backflow following PO. Recommend pt resume nectar thick liquids with puree, following esophageal precautions (demosntrated and provided to pts caregiver). Pt may benefit from f/u MBS or esophageal assessment if further workup part of plan of care. Pt to d/c home right after assessment. Education complete with caregiver.    HPI HPI: 79 year old female with PMH of Huntington's disease, chronic a-fib, hypothyroidism, admitted with NCAP, likely secondary to aspiration event which is a recurrent issue. Diagnosed with dysphagia and discharged after recent admission for aspiration PNA on a dysphagia 1 diet with nectar- thick liquids. Caregiver witnessed aspiration epsiode on day of admission where patient had difficulty breathing after eating, requiring 2 heimlich manuevers.    Pertinent Vitals Pain Assessment: No/denies pain  SLP Plan  Continue with current plan of care    Recommendations Diet recommendations: Dysphagia 1 (puree);Nectar-thick liquid Liquids provided via: Cup;Straw Medication Administration: Crushed with puree Supervision: Full supervision/cueing for compensatory  strategies;Trained caregiver to feed patient Compensations: Slow rate;Small sips/bites;Follow solids with liquid Postural Changes and/or Swallow Maneuvers: Upright 30-60 min after meal;Seated upright 90 degrees              Oral Care Recommendations: Oral care BID Follow up Recommendations: None Plan: Continue with current plan of care    GO    [facility], MA CCC-SLP [phone]  [name] 04/10/2014, 2:11 PM

## 2014-04-10 NOTE — Discharge Summary (Signed)
Physician Discharge Summary  Sydney [name] MRN: [id number] DOB/AGE: November 13, 1934 79 y.o.  PCP: Ginette Otto, MD   Admit date: 04/06/2014 Discharge date: 04/10/2014  Discharge Diagnoses:  Huntington's disease   HCAP (healthcare-associated pneumonia)   Malnutrition of moderate degree  Follow up recommendations Follow up with PCP in 5-7 days Continue home hospice at home     Medication List    STOP taking these medications       clonazePAM 0.5 MG tablet  Commonly known as:  KLONOPIN     estrogens (conjugated) 0.3 MG tablet  Commonly known as:  PREMARIN     ibuprofen 800 MG tablet  Commonly known as:  ADVIL,MOTRIN     loperamide 2 MG tablet  Commonly known as:  IMODIUM A-D      TAKE these medications       acetaminophen 325 MG tablet  Commonly known as:  TYLENOL  Take 650 mg by mouth every 4 (four) hours as needed (pain).     ALPRAZolam 0.25 MG tablet  Commonly known as:  XANAX  Take 1-2 tablets (0.25-0.5 mg total) by mouth 2 (two) times daily as needed for anxiety (anxiety). take 1tab 0.25mg  qdprn and 2tablets (0.5mg )qhsprn     AMBULATORY NON FORMULARY MEDICATION  - Morphine Sul Sol 100/67ml  - Sig: Administer 0.62ml every 2 hours as needed for pain/ dyspnea; Administer 0.49ml every 2 hours as needed for dyspnea/pain     aspirin EC 81 MG tablet  Take 81 mg by mouth daily.     atropine 1 % ophthalmic ointment  Place 1 application under the tongue 3 (three) times daily. 3-4 drops under tongue for excessive secretions     digoxin 0.25 MG tablet  Commonly known as:  LANOXIN  Take 0.125 mcg by mouth daily. Takes half a tablet     feeding supplement (ENSURE COMPLETE) Liqd  Take 120 mLs by mouth 2 (two) times daily between meals.     feeding supplement (ENSURE) Pudg  Take 1 Container by mouth 3 (three) times daily between meals.     FLUoxetine 20 MG capsule  Commonly known as:  PROZAC  Take 20 mg by mouth daily.     food thickener Powd   Commonly known as:  THICK IT  With food     haloperidol 0.5 MG tablet  Commonly known as:  HALDOL  Take 0.5 mg by mouth 2 (two) times daily.     levofloxacin 500 MG tablet  Commonly known as:  LEVAQUIN  Take 1 tablet (500 mg total) by mouth daily.     levothyroxine 75 MCG tablet  Commonly known as:  SYNTHROID, LEVOTHROID  Take 75 mcg by mouth daily before breakfast.     meclizine 25 MG tablet  Commonly known as:  ANTIVERT  Take 25 mg by mouth 3 (three) times daily as needed. For dizziness/nausea.     morphine 20 MG/ML concentrated solution  Commonly known as:  ROXANOL  Take 0.25-0.5 mLs (5-10 mg total) by mouth every 2 (two) hours as needed for severe pain (dyspnea/pain).     nitroGLYCERIN 0.4 MG SL tablet  Commonly known as:  NITROSTAT  Place 0.4 mg under the tongue every 5 (five) minutes x 3 doses as needed.     omeprazole 20 MG capsule  Commonly known as:  PRILOSEC  Take 20 mg by mouth daily.     ondansetron 8 MG disintegrating tablet  Commonly known as:  ZOFRAN-ODT  Take 8 mg by  mouth every 8 (eight) hours as needed for nausea. For nausea.     polyethylene glycol packet  Commonly known as:  MIRALAX / GLYCOLAX  Take 17 g by mouth daily. For constipation.     promethazine 25 MG suppository  Commonly known as:  PHENERGAN  Place 25 mg rectally every 8 (eight) hours as needed. For nausea.     RESOURCE THICKENUP CLEAR Powd  With food     senna-docusate 8.6-50 MG per tablet  Commonly known as:  Senokot-S  Take 2 tablets by mouth at bedtime.        Discharge Condition: Guarded  Disposition: 50-Hospice/Home   Consults: None  Significant Diagnostic Studies: Dg Chest 2 View  04/07/2014   CLINICAL DATA:  Shortness of breath  EXAM: CHEST  2 VIEW  COMPARISON:  04/06/2014; 03/08/2014; 10/01/2011; chest CT - 10/12/2011  FINDINGS: Grossly unchanged borderline enlarged cardiac silhouette. Normal mediastinal contours. Overall improved aeration of the lungs. No focal  airspace opacities. No pleural effusion or pneumothorax. No evidence of edema. No acute osseus abnormalities.  IMPRESSION: No acute cardiopulmonary disease.   Electronically Signed   By: Sandi Mariscal M.D.   On: 04/07/2014 08:50   Dg Chest Port 1 View  04/06/2014   CLINICAL DATA:  Dyspnea. Aspiration pneumonia. Huntington's disease.  EXAM: PORTABLE CHEST - 1 VIEW  COMPARISON:  03/08/2014  FINDINGS: Persistent airspace opacity is seen in the retrocardiac left lower lobe. Right lung remains clear. No evidence of pleural effusion. Heart size is stable.  IMPRESSION: Persistent airspace disease in left retrocardiac lung base, suspicious for pneumonia.   Electronically Signed   By: Earle Gell M.D.   On: 04/06/2014 18:48      Microbiology: Recent Results (from the past 240 hour(s))  CULTURE, BLOOD (ROUTINE X 2)     Status: None   Collection Time    04/06/14  9:29 PM      Result Value Ref Range Status   Specimen Description BLOOD LEFT WRIST   Final   Special Requests     Final   Value: BOTTLES DRAWN AEROBIC AND ANAEROBIC BLUE 4 CC RED 5 CC   Culture  Setup Time     Final   Value: 04/07/2014 04:48     Performed at [facility]   Culture     Final   Value:        BLOOD CULTURE RECEIVED NO GROWTH TO DATE CULTURE WILL BE HELD FOR 5 DAYS BEFORE ISSUING A FINAL NEGATIVE REPORT     Performed at [facility]   Report Status PENDING   Incomplete  CULTURE, BLOOD (ROUTINE X 2)     Status: None   Collection Time    04/06/14  9:36 PM      Result Value Ref Range Status   Specimen Description BLOOD RIGHT WRIST   Final   Special Requests BOTTLES DRAWN AEROBIC AND ANAEROBIC 5 CC   Final   Culture  Setup Time     Final   Value: 04/07/2014 04:48     Performed at [facility]   Culture     Final   Value:        BLOOD CULTURE RECEIVED NO GROWTH TO DATE CULTURE WILL BE HELD FOR 5 DAYS BEFORE ISSUING A FINAL NEGATIVE REPORT     Performed at [facility]   Report Status PENDING    Incomplete  GRAM STAIN     Status: None   Collection Time  04/06/14 10:20 PM      Result Value Ref Range Status   Specimen Description     Final   Value: URINE, RANDOM CONFIRMED WITH KOKO Elane Fritz [id number] [facility] [address]   Special Requests NONE   Final   Gram Stain     Final   Value: CYTOSPUN     WBC PRESENT, PREDOMINANTLY MONONUCLEAR     NO ORGANISMS SEEN   Report Status 04/06/2014 FINAL   Final  MRSA PCR SCREENING     Status: Abnormal   Collection Time    04/07/14  7:43 AM      Result Value Ref Range Status   MRSA by PCR POSITIVE (*) NEGATIVE Final   Comment:            The GeneXpert MRSA Assay (FDA     approved for NASAL specimens     only), is one component of a     comprehensive MRSA colonization     surveillance program. It is not     intended to diagnose MRSA     infection nor to guide or     monitor treatment for     MRSA infections.     RESULT CALLED TO, READ BACK BY AND VERIFIED WITH:     THOMPSON RN 10:15 04/07/14 ([email or web address])     Labs: Results for orders placed during the hospital encounter of 04/06/14 (from the past 48 hour(s))  BASIC METABOLIC PANEL     Status: Abnormal   Collection Time    04/10/14  6:16 AM      Result Value Ref Range   Sodium 141  137 - 147 mEq/L   Potassium 3.7  3.7 - 5.3 mEq/L   Chloride 103  96 - 112 mEq/L   CO2 26  19 - 32 mEq/L   Glucose, Bld 106 (*) 70 - 99 mg/dL   BUN 8  6 - 23 mg/dL   Creatinine, Ser 0.59  0.50 - 1.10 mg/dL   Calcium 8.9  8.4 - 10.5 mg/dL   GFR calc non Af Amer 86 (*) >90 mL/min   GFR calc Af Amer >90  >90 mL/min   Comment: (NOTE)     The eGFR has been calculated using the CKD EPI equation.     This calculation has not been validated in all clinical situations.     eGFR's persistently <90 mL/min signify possible Chronic Kidney     Disease.   Anion gap 12  5 - 15     Brief narrative:  79 y.o. 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 Afib presenting  progressive weakness, failure to thrive, poor oral intake, dyspnea, non productive cough. Pt noted to have been admitted 2-3 weeks ago for aspiration PNA. Pt was discharged on a 3 day course of oral antibiotics. Per caregiver/HCPOA, pt has had recurrent cough and? Aspiration events at home.  On arrival to the ER, pt was afebrile, hemodynamically stable. HR 70s-80s. Resp 10a-20s. BP 110s-130s. Satting >93% on RA. CBC and BMET WNL. CXR shows Persistent airspace disease in left retrocardiac lung base, suspicious for pneumonia. Started on vanc and cefepime by EDP.   Assessment and Plan:   Active Problems:  HCAP (healthcare-associated pneumonia)  - pt clinically stable this AM And reports feeling better  - she is more alert and comprehends what is being told, responding appropriately - denies shortness of breath and chest pain  - Discontinue Vancomycin and Maxipime she  has received 3 days of this antibiotic regimen, Patient will continue with oral levofloxacin for another 5 days, Augmentin not prescribed because of allergy to penicillin Oxygen need as determined by hospice  - SLP done, dys I diet recommended   Severe PCM  - secondary to progressive nature of the illness  - advance diet to dys I as recommended  Patient's healthcare power of attorney refuses feeding tube  History of atrial fibrillation In normal sinus rhythm Not a candidate for long-term under coagulation secondary to end-stage Huntington's disease Continue aspirin  Huntington's disease  - PT/OT recommend continuation of hoyer lift, hospital bed, wheelchair; chair lift         Discharge Exam:   Blood pressure 136/77, pulse 88, temperature 98.4 F (36.9 C), temperature source Oral, resp. rate 19, height $RemoveBe'5\' 6"'ZaApnYmVL$  (1.676 m), weight 60.2 kg (132 lb 11.5 oz), SpO2 95.00%.  General: alert & oriented x 2, no acute distress  Cardiovascular: RRR, nl S1 s2  Respiratory: Decreased breath sounds at the bases, scattered rhonchi, no  crackles  Abdomen: soft +BS NT/ND, no masses palpable  Extremities: No cyanosis and no edema            Follow-up Information   Follow up with Mathews Argyle, MD. Schedule an appointment as soon as possible for a visit in 1 week.   Specialty:  Internal Medicine   Contact information:   [address] [place] [address] [phone]       Signed: Reyne Dumas 04/10/2014, 1:07 PM

## 2014-04-10 NOTE — Progress Notes (Signed)
OT Cancellation Note  Patient Details Name: Sydney [name] MRN: [id number] DOB: Jun 27, 1935   Cancelled Treatment:    Reason Eval/Treat Not Completed: OT screened. Per PT note, pt has all equipment needs and is going home with hospice.   Earlie Raveling OTR/L [phone] 04/10/2014, 9:30 AM

## 2014-04-10 NOTE — Progress Notes (Signed)
Inpatient RN visit- Sofhia Ulibarri [facility] 6N Room 13 -[facility] RN Visit-Karen Merilynn Finland RN  Related admission to [facility] diagnosis of Huntington's dz.  Pt is DNR code.  Pt alert & oriented sitting up in bed, caregiver Fannie Knee present during visit. Fannie Knee fed pt her lunch, reported that liquid was changed from honey to nectar thick as patient could not tolerate the honey thick. IV Abt to be d/cd, pt to discharge with levaquin po for treatment of PNA.  Per Fannie Knee patient's bed is being changed out at home, pt to d/c via non emergent transport at 5pm. [facility] team aware of discharge.  Patient's home medication list and transfer summary in place on shadow chart.   Please call [facility] @ [phone]-  with any hospice needs.   Thank you. Hansel Starling, RN  [facility]  Hospice Liaison  [phone])

## 2014-04-13 LAB — CULTURE, BLOOD (ROUTINE X 2)
CULTURE: NO GROWTH
Culture: NO GROWTH

## 2015-12-31 ENCOUNTER — Telehealth: Payer: Self-pay | Admitting: Cardiology

## 2015-12-31 NOTE — Telephone Encounter (Deleted)
New message     The pt was wanting to know the recovery time for the procedure on Thursday on the left arm cause the takes care of another pt and needs to know how long she will be out of work.

## 2017-09-16 ENCOUNTER — Encounter (HOSPITAL_COMMUNITY): Payer: Self-pay | Admitting: Emergency Medicine

## 2017-09-16 ENCOUNTER — Inpatient Hospital Stay (HOSPITAL_COMMUNITY)
Admission: EM | Admit: 2017-09-16 | Discharge: 2017-09-22 | DRG: 872 | Disposition: A | Discharge status: Skilled Nursing Facility | Attending: Internal Medicine | Admitting: Internal Medicine

## 2017-09-16 DIAGNOSIS — Z880 Allergy status to penicillin: Secondary | ICD-10-CM

## 2017-09-16 DIAGNOSIS — I482 Chronic atrial fibrillation, unspecified: Secondary | ICD-10-CM | POA: Diagnosis present

## 2017-09-16 DIAGNOSIS — Z66 Do not resuscitate: Secondary | ICD-10-CM | POA: Diagnosis present

## 2017-09-16 DIAGNOSIS — K219 Gastro-esophageal reflux disease without esophagitis: Secondary | ICD-10-CM | POA: Diagnosis present

## 2017-09-16 DIAGNOSIS — A0472 Enterocolitis due to Clostridium difficile, not specified as recurrent: Secondary | ICD-10-CM | POA: Diagnosis present

## 2017-09-16 DIAGNOSIS — Z885 Allergy status to narcotic agent status: Secondary | ICD-10-CM

## 2017-09-16 DIAGNOSIS — A419 Sepsis, unspecified organism: Secondary | ICD-10-CM | POA: Diagnosis not present

## 2017-09-16 DIAGNOSIS — E860 Dehydration: Secondary | ICD-10-CM | POA: Diagnosis present

## 2017-09-16 DIAGNOSIS — J9611 Chronic respiratory failure with hypoxia: Secondary | ICD-10-CM | POA: Diagnosis present

## 2017-09-16 DIAGNOSIS — Z9981 Dependence on supplemental oxygen: Secondary | ICD-10-CM | POA: Diagnosis not present

## 2017-09-16 DIAGNOSIS — Z9049 Acquired absence of other specified parts of digestive tract: Secondary | ICD-10-CM | POA: Diagnosis not present

## 2017-09-16 DIAGNOSIS — E875 Hyperkalemia: Secondary | ICD-10-CM | POA: Diagnosis present

## 2017-09-16 DIAGNOSIS — G10 Huntington's disease: Secondary | ICD-10-CM | POA: Diagnosis present

## 2017-09-16 DIAGNOSIS — Z79899 Other long term (current) drug therapy: Secondary | ICD-10-CM | POA: Diagnosis not present

## 2017-09-16 DIAGNOSIS — E039 Hypothyroidism, unspecified: Secondary | ICD-10-CM | POA: Diagnosis present

## 2017-09-16 DIAGNOSIS — Z87891 Personal history of nicotine dependence: Secondary | ICD-10-CM

## 2017-09-16 DIAGNOSIS — F0280 Dementia in other diseases classified elsewhere without behavioral disturbance: Secondary | ICD-10-CM | POA: Diagnosis present

## 2017-09-16 DIAGNOSIS — G969 Disorder of central nervous system, unspecified: Secondary | ICD-10-CM | POA: Diagnosis present

## 2017-09-16 DIAGNOSIS — N179 Acute kidney failure, unspecified: Secondary | ICD-10-CM | POA: Diagnosis present

## 2017-09-16 DIAGNOSIS — Z7982 Long term (current) use of aspirin: Secondary | ICD-10-CM | POA: Diagnosis not present

## 2017-09-16 DIAGNOSIS — A414 Sepsis due to anaerobes: Secondary | ICD-10-CM | POA: Diagnosis present

## 2017-09-16 LAB — BASIC METABOLIC PANEL
Anion gap: 15 (ref 5–15)
BUN: 23 mg/dL — AB (ref 6–20)
CHLORIDE: 105 mmol/L (ref 101–111)
CO2: 21 mmol/L — ABNORMAL LOW (ref 22–32)
CREATININE: 0.81 mg/dL (ref 0.44–1.00)
Calcium: 9 mg/dL (ref 8.9–10.3)
GFR calc Af Amer: >60 mL/min (ref >60)
GFR calc non Af Amer: >60 mL/min (ref >60)
GLUCOSE: 104 mg/dL — AB (ref 65–99)
POTASSIUM: 5.7 mmol/L — AB (ref 3.5–5.1)
Sodium: 141 mmol/L (ref 135–145)

## 2017-09-16 LAB — CLOSTRIDIUM DIFFICILE BY PCR, REFLEXED: Toxigenic C. Difficile by PCR: POSITIVE — AB

## 2017-09-16 LAB — GASTROINTESTINAL PANEL BY PCR, STOOL (REPLACES STOOL CULTURE)

## 2017-09-16 LAB — C DIFFICILE QUICK SCREEN W PCR REFLEX
C Diff antigen: POSITIVE — AB
C Diff toxin: NEGATIVE

## 2017-09-16 LAB — URINALYSIS, ROUTINE W REFLEX MICROSCOPIC
Bacteria, UA: NONE SEEN
Glucose, UA: NEGATIVE mg/dL
Ketones, ur: 5 mg/dL — AB
Leukocytes, UA: NEGATIVE
Nitrite: POSITIVE — AB
Protein, ur: NEGATIVE mg/dL
Specific Gravity, Urine: 1.03 (ref 1.005–1.030)
Squamous Epithelial / LPF: NONE SEEN
pH: 5 (ref 5.0–8.0)

## 2017-09-16 LAB — CBC WITH DIFFERENTIAL/PLATELET
Basophils Absolute: 0 10*3/uL (ref 0.0–0.1)
Basophils Relative: 0 %
Eosinophils Absolute: 0 10*3/uL (ref 0.0–0.7)
Eosinophils Relative: 0 %
HCT: 50.4 % — ABNORMAL HIGH (ref 36.0–46.0)
Hemoglobin: 16.5 g/dL — ABNORMAL HIGH (ref 12.0–15.0)
Lymphocytes Relative: 3 %
Lymphs Abs: 0.7 10*3/uL (ref 0.7–4.0)
MCH: 30.8 pg (ref 26.0–34.0)
MCHC: 32.7 g/dL (ref 30.0–36.0)
MCV: 94.2 fL (ref 78.0–100.0)
Monocytes Absolute: 1.9 10*3/uL — ABNORMAL HIGH (ref 0.1–1.0)
Monocytes Relative: 9 %
Neutro Abs: 18.6 10*3/uL — ABNORMAL HIGH (ref 1.7–7.7)
Neutrophils Relative %: 88 %
Platelets: 276 10*3/uL (ref 150–400)
RBC: 5.35 MIL/uL — ABNORMAL HIGH (ref 3.87–5.11)
RDW: 14.6 % (ref 11.5–15.5)
WBC: 21.2 10*3/uL — ABNORMAL HIGH (ref 4.0–10.5)

## 2017-09-16 LAB — COMPREHENSIVE METABOLIC PANEL
ALT: 21 U/L (ref 14–54)
AST: 36 U/L (ref 15–41)
Albumin: 3.1 g/dL — ABNORMAL LOW (ref 3.5–5.0)
Alkaline Phosphatase: 115 U/L (ref 38–126)
Anion gap: 16 — ABNORMAL HIGH (ref 5–15)
BUN: 23 mg/dL — ABNORMAL HIGH (ref 6–20)
CO2: 21 mmol/L — ABNORMAL LOW (ref 22–32)
Calcium: 8.8 mg/dL — ABNORMAL LOW (ref 8.9–10.3)
Chloride: 104 mmol/L (ref 101–111)
Creatinine, Ser: 0.93 mg/dL (ref 0.44–1.00)
GFR calc Af Amer: >60 mL/min (ref >60)
GFR calc non Af Amer: 56 mL/min — ABNORMAL LOW (ref >60)
Glucose, Bld: 142 mg/dL — ABNORMAL HIGH (ref 65–99)
Potassium: 5.6 mmol/L — ABNORMAL HIGH (ref 3.5–5.1)
Sodium: 141 mmol/L (ref 135–145)
Total Bilirubin: 1.1 mg/dL (ref 0.3–1.2)
Total Protein: 6.7 g/dL (ref 6.5–8.1)

## 2017-09-16 LAB — LIPASE, BLOOD: Lipase: 19 U/L (ref 11–51)

## 2017-09-16 LAB — CK: Total CK: 20 U/L — ABNORMAL LOW (ref 38–234)

## 2017-09-16 MED ORDER — METRONIDAZOLE IN NACL 5-0.79 MG/ML-% IV SOLN
500.0000 mg | Freq: Three times a day (TID) | INTRAVENOUS | Status: DC
  Administered 2017-09-16: 500 mg via INTRAVENOUS
  Filled 2017-09-16 (×2): qty 100

## 2017-09-16 MED ORDER — RISPERIDONE 0.5 MG PO TABS
0.5000 mg | ORAL_TABLET | Freq: Two times a day (BID) | ORAL | Status: DC
  Administered 2017-09-16 – 2017-09-22 (×12): 0.5 mg via ORAL
  Filled 2017-09-16 (×12): qty 1

## 2017-09-16 MED ORDER — VANCOMYCIN 50 MG/ML ORAL SOLUTION
125.0000 mg | Freq: Four times a day (QID) | ORAL | Status: DC
  Filled 2017-09-16: qty 2.5

## 2017-09-16 MED ORDER — HALOPERIDOL 1 MG PO TABS: 0.5000 mg | ORAL_TABLET | Freq: Two times a day (BID) | ORAL | Status: DC

## 2017-09-16 MED ORDER — SODIUM CHLORIDE 0.9% FLUSH
3.0000 mL | Freq: Two times a day (BID) | INTRAVENOUS | Status: DC
  Administered 2017-09-16 – 2017-09-22 (×9): 3 mL via INTRAVENOUS

## 2017-09-16 MED ORDER — ALPRAZOLAM 0.25 MG PO TABS
0.2500 mg | ORAL_TABLET | Freq: Two times a day (BID) | ORAL | Status: DC
  Administered 2017-09-16 – 2017-09-22 (×12): 0.25 mg via ORAL
  Filled 2017-09-16 (×12): qty 1

## 2017-09-16 MED ORDER — VANCOMYCIN 50 MG/ML ORAL SOLUTION
125.0000 mg | Freq: Four times a day (QID) | ORAL | Status: DC
  Filled 2017-09-16 (×2): qty 2.5

## 2017-09-16 MED ORDER — ONDANSETRON HCL 4 MG PO TABS: 4.0000 mg | ORAL_TABLET | Freq: Four times a day (QID) | ORAL | Status: DC | PRN

## 2017-09-16 MED ORDER — RESOURCE THICKENUP CLEAR PO POWD
ORAL | Status: DC | PRN
  Filled 2017-09-16: qty 125

## 2017-09-16 MED ORDER — ACETAMINOPHEN 325 MG PO TABS: 650.0000 mg | ORAL_TABLET | Freq: Four times a day (QID) | ORAL | Status: DC | PRN

## 2017-09-16 MED ORDER — LEVOTHYROXINE SODIUM 75 MCG PO TABS
75.0000 ug | ORAL_TABLET | Freq: Every day | ORAL | Status: DC
  Administered 2017-09-17 – 2017-09-22 (×6): 75 ug via ORAL
  Filled 2017-09-16 (×6): qty 1

## 2017-09-16 MED ORDER — SODIUM CHLORIDE 0.9 % IV SOLN
INTRAVENOUS | Status: DC
  Administered 2017-09-16 – 2017-09-19 (×3): via INTRAVENOUS

## 2017-09-16 MED ORDER — SODIUM CHLORIDE 0.9 % IV BOLUS (SEPSIS)
500.0000 mL | Freq: Once | INTRAVENOUS | Status: AC
  Administered 2017-09-16: 500 mL via INTRAVENOUS

## 2017-09-16 MED ORDER — ONDANSETRON HCL 4 MG/2ML IJ SOLN
4.0000 mg | Freq: Four times a day (QID) | INTRAMUSCULAR | Status: DC | PRN
  Administered 2017-09-17: 4 mg via INTRAVENOUS
  Filled 2017-09-16: qty 2

## 2017-09-16 MED ORDER — ACETAMINOPHEN 650 MG RE SUPP: 650.0000 mg | Freq: Four times a day (QID) | RECTAL | Status: DC | PRN

## 2017-09-16 MED ORDER — ENOXAPARIN SODIUM 40 MG/0.4ML ~~LOC~~ SOLN
40.0000 mg | SUBCUTANEOUS | Status: DC
  Administered 2017-09-16 – 2017-09-21 (×6): 40 mg via SUBCUTANEOUS
  Filled 2017-09-16 (×6): qty 0.4

## 2017-09-16 MED ORDER — VANCOMYCIN 50 MG/ML ORAL SOLUTION
500.0000 mg | Freq: Four times a day (QID) | ORAL | Status: DC
  Administered 2017-09-16 – 2017-09-17 (×3): 500 mg via ORAL
  Filled 2017-09-16 (×5): qty 10

## 2017-09-16 MED ORDER — VANCOMYCIN 50 MG/ML ORAL SOLUTION
125.0000 mg | Freq: Once | ORAL | Status: AC
Start: 2017-09-16 — End: 2017-09-16
  Administered 2017-09-16: 125 mg via ORAL
  Filled 2017-09-16: qty 2.5

## 2017-09-16 MED ORDER — MORPHINE SULFATE 10 MG/5ML PO SOLN
5.0000 mg | ORAL | Status: DC | PRN
Start: 2017-09-16 — End: 2017-09-22

## 2017-09-16 NOTE — ED Notes (Addendum)
Lounge chair gotten for caregiver; no signs of distress; resting.

## 2017-09-16 NOTE — ED Notes (Addendum)
Sydney [name] [phone] HCPOA, POA, NOK

## 2017-09-16 NOTE — Progress Notes (Signed)
[facility]: RN visit  Notified by on call RN that patient [facility] due to N/V/D.   Visited patient and caregiver, Sydney HoesSue Andrews at bedside. Patient does not appear in distress. She is awake but not conversational. The plan is to admit to stabilize symptoms. She is positive for C-Diff infection, WBC 21.2, RBC 5.35, HGB 16.5.  Hospice will continue to follow while hospitalized and anticipate discharge needs.  Please call with any hospice related questions.   Please use [facility] for any ambulance transportation needs.  Thank you,  Elsie SaasMary Anne Robertson, RN, [facility] Hospital Liaison [phone]  [facility] Hospital Liaisons are on AMION

## 2017-09-16 NOTE — ED Notes (Signed)
Rectal tube placed; patient in and out cathed for urine. Urine is dark brown. Purewick placed.

## 2017-09-16 NOTE — ED Notes (Signed)
Patient had another episode of diarr. Cleaned. Order for rectal tube obtained.

## 2017-09-16 NOTE — ED Triage Notes (Addendum)
PT reports nausea and vomiting for 5 hours. [facility]. Nonverbal- advanced Huntingtons disease. She is DNR; yellow form not transported with her. 4 zofran and fluids given by EMS.

## 2017-09-16 NOTE — ED Notes (Signed)
Pt covered from waist down in yellowish diarr; dark particulates that look like coffee grounds. Patient cleaned and washed. Warm blankets given.

## 2017-09-16 NOTE — ED Notes (Signed)
Pt's rectal tube leaking; reassessed and stool now flowing into tube. Cleaned and changed.

## 2017-09-16 NOTE — ED Provider Notes (Signed)
[facility] Provider Note   CSN: [id number] Arrival date & time: 09/16/17  [id number]     History   Chief Complaint Chief Complaint  Patient presents with  . GI Problem   Level 5 caveat due to advanced Huntington's disease HPI Sydney [name] is a 83 y.o. female with history of A-fib, respiratory failure, GERD, advanced Huntington's disease presents today for evaluation of acute onset of nausea, vomiting, which began 5 hours ago.  EMS gave 4 mg of Zofran and IV fluids.  Patient is a hospice patient.  [facility], patient is DNR. [facility] which lasted 5 days. Returned 2 days ago. Good appetite. Pureed foods mostly.  Lives at home with a caregiver.  Patient is nonverbal at baseline.  The history is provided by the EMS personnel Boneta Lucks(Jenny from [facility]). The history is limited by the condition of the patient.  GI Problem     Past Medical History:  Diagnosis Date  . Atrial fibrillation (HCC)   . Chronic respiratory failure (HCC)    nocturnal oxygen  . Complication of anesthesia    felt surgery with epidural  . GERD (gastroesophageal reflux disease)   . Huntington disease (HCC)   . Pneumonia 04/07/2014  . Thyroid disease     Patient Active Problem List   Diagnosis Date Noted  . Sepsis (HCC) 09/16/2017  . Clostridium difficile colitis 09/16/2017  . Huntington disease (HCC) 09/16/2017  . Atrial fibrillation, chronic (HCC) 09/16/2017  . Chronic respiratory failure with hypoxia/nocturnal O2 09/16/2017  . Hypothyroidism, adult 09/16/2017  . Acute kidney injury (HCC) 09/16/2017  . Acute hyperkalemia 09/16/2017  . Malnutrition of moderate degree (HCC) 04/07/2014  . HCAP (healthcare-associated pneumonia) 04/06/2014  . Pneumonia 03/08/2014  . Status post laparoscopic cholecystectomy 11/30/2011  . UTI (lower urinary tract infection) 11/27/2011  . Constipation 11/24/2011  .  Hypothyroidism 11/24/2011  . Vertigo 09/24/2011  . Aspiration pneumonia (HCC) 09/23/2011  . Huntington's disease (HCC) 09/23/2011  . Campath-induced atrial fibrillation 09/23/2011    Past Surgical History:  Procedure Laterality Date  . ABDOMINAL HYSTERECTOMY    . APPENDECTOMY    . CHOLECYSTECTOMY  11/28/2011   Procedure: LAPAROSCOPIC CHOLECYSTECTOMY;  Surgeon: Emelia LoronMatthew Wakefield, MD;  Location: [facility] ORS;  Service: General;  Laterality: N/A;  . COLON SURGERY    . HEMORRHOID SURGERY    . TONSILLECTOMY      OB History    No data available       Home Medications    Prior to Admission medications   Medication Sig Start Date End Date Taking? Authorizing Provider  ALPRAZolam (XANAX) 0.25 MG tablet Take 1-2 tablets (0.25-0.5 mg total) by mouth 2 (two) times daily as needed for anxiety (anxiety). take 1tab 0.25mg  qdprn and 2tablets (0.5mg )qhsprn Patient taking differently: Take 0.25 mg by mouth 2 (two) times daily.  04/10/14  Yes Richarda OverlieAbrol, Nayana, MD  aspirin EC 81 MG tablet Take 81 mg by mouth at bedtime.    Yes [provider]  atropine 1 % ophthalmic ointment Place under the tongue as needed (2-3  drops under tongue for excessive secretions).    Yes [provider]  clonazePAM (KLONOPIN) 0.5 MG tablet Take 1 mg by mouth at bedtime.   Yes [provider]  FLUoxetine (PROZAC) 20 MG capsule Take 20 mg by mouth daily.     Yes [provider]  haloperidol (HALDOL) 0.5 MG tablet Take 0.5 mg by  mouth 2 (two) times daily.     Yes [provider]  levothyroxine (SYNTHROID, LEVOTHROID) 75 MCG tablet Take 75 mcg by mouth daily before breakfast.   Yes [provider]  omeprazole (PRILOSEC) 40 MG capsule Take 40 mg by mouth daily. 09/01/17  Yes [provider]  polyethylene glycol (MIRALAX / GLYCOLAX) packet Take 17 g by mouth daily. For constipation. Patient taking differently: Take 17 g by mouth daily as needed. For constipation.   In morning  coffee 04/10/14  Yes Richarda Overlie, MD  risperiDONE (RISPERDAL) 0.5 MG tablet Take 0.5 mg by mouth 2 (two) times daily. 08/25/17  Yes [provider]  senna-docusate (SENOKOT-S) 8.6-50 MG per tablet Take 2 tablets by mouth at bedtime. Patient taking differently: Take 1-2 tablets by mouth 2 (two) times daily as needed for mild constipation.  03/10/14  Yes Ghimire, Werner Lean, MD  acetaminophen (TYLENOL) 325 MG tablet Take 650 mg by mouth every 4 (four) hours as needed (pain).    [provider]  AMBULATORY NON FORMULARY MEDICATION Morphine Sul Sol 100/31ml Sig: Administer 0.74ml every 2 hours as needed for pain/ dyspnea; Administer 0.11ml every 2 hours as needed for dyspnea/pain Patient not taking: Reported on 09/16/2017 03/06/14   Bufford Spikes L, DO  digoxin (LANOXIN) 0.25 MG tablet Take 0.125 mcg by mouth daily. Takes half a tablet    [provider]  feeding supplement, ENSURE COMPLETE, (ENSURE COMPLETE) LIQD Take 120 mLs by mouth 2 (two) times daily between meals.    [provider]  feeding supplement, ENSURE, (ENSURE) PUDG Take 1 Container by mouth 3 (three) times daily between meals. Patient not taking: Reported on 09/16/2017 04/10/14   Richarda Overlie, MD  food thickener (THICK IT) POWD With food Patient not taking: Reported on 09/16/2017 04/10/14   Richarda Overlie, MD  Maltodextrin-Xanthan Gum (RESOURCE THICKENUP CLEAR) POWD With food Patient not taking: Reported on 09/16/2017 04/10/14   Richarda Overlie, MD  meclizine (ANTIVERT) 25 MG tablet Take 25 mg by mouth 3 (three) times daily as needed. For dizziness/nausea.    [provider]  morphine (ROXANOL) 20 MG/ML concentrated solution Take 0.25-0.5 mLs (5-10 mg total) by mouth every 2 (two) hours as needed for severe pain (dyspnea/pain). Patient not taking: Reported on 09/16/2017 04/10/14   Richarda Overlie, MD  nitroGLYCERIN (NITROSTAT) 0.4 MG SL tablet Place 0.4 mg under the tongue every 5 (five) minutes x 3 doses as  needed.    [provider]  ondansetron (ZOFRAN-ODT) 8 MG disintegrating tablet Take 8 mg by mouth every 8 (eight) hours as needed for nausea. For nausea. 10/02/11   Penny Pia, MD  promethazine (PHENERGAN) 25 MG suppository Place 25 mg rectally every 8 (eight) hours as needed. For nausea.    [provider]    Family History Family History  Problem Relation Age of Onset  . Heart disease Mother   . Stroke Father   . Heart disease Brother   . Huntington's disease Unknown   . Diabetes type II Unknown   . Coronary artery disease Unknown     Social History Social History   Tobacco Use  . Smoking status: Former Smoker    Last attempt to quit: 09/22/1998    Years since quitting: 18.9  . Smokeless tobacco: Never Used  Substance Use Topics  . Alcohol use: No  . Drug use: No     Allergies   Codeine and Penicillins cross reactors   Review of Systems Review of Systems  Unable to perform ROS: Patient nonverbal     Physical Exam Updated Vital Signs BP (!) 99/54   Pulse (!) 106   Temp 98.9 F (37.2 C) (Rectal)   SpO2 93%   Physical Exam  Constitutional: She appears well-developed and well-nourished. No distress.  HENT:  Head: Normocephalic and atraumatic.  Eyes: Conjunctivae are normal. Right eye exhibits no discharge. Left eye exhibits no discharge.  Neck: No JVD present. No tracheal deviation present.  Cardiovascular: Normal rate, regular rhythm and normal heart sounds.  Pulmonary/Chest: Effort normal and breath sounds normal. No stridor. No respiratory distress. She has no wheezes. She has no rales.  Abdominal: Soft. Bowel sounds are normal. She exhibits no distension. There is no tenderness. There is no guarding.  Genitourinary:  Genitourinary Comments: Examination performed in the presence of chaperone.  No frank rectal bleeding.  No fissures or tears noted.  Yellow loose watery stool actively being admitted with dark particulates.     Musculoskeletal: She exhibits no edema.  Neurological: She is alert.  Eyes are open, patient moves extremities spontaneously with some limitations.  She does respond to external stimuli  Skin: Skin is warm and dry. No erythema.  Psychiatric: She has a normal mood and affect. Her behavior is normal.  Nursing note and vitals reviewed.    ED Treatments / Results  Labs (all labs ordered are listed, but only abnormal results are displayed) Labs Reviewed  C DIFFICILE QUICK SCREEN W PCR REFLEX - Abnormal; Notable for the following components:      Result Value   C Diff antigen POSITIVE (*)    All other components within normal limits  CLOSTRIDIUM DIFFICILE BY PCR, REFLEXED - Abnormal; Notable for the following components:   Toxigenic C. Difficile by PCR POSITIVE (*)    All other components within normal limits  CBC WITH DIFFERENTIAL/PLATELET - Abnormal; Notable for the following components:   WBC 21.2 (*)    RBC 5.35 (*)    Hemoglobin 16.5 (*)    HCT 50.4 (*)    Neutro Abs 18.6 (*)    Monocytes Absolute 1.9 (*)    All other components within normal limits  COMPREHENSIVE METABOLIC PANEL - Abnormal; Notable for the following components:   Potassium 5.6 (*)    CO2 21 (*)    Glucose, Bld 142 (*)    BUN 23 (*)    Calcium 8.8 (*)    Albumin 3.1 (*)    GFR calc non Af Amer 56 (*)    Anion gap 16 (*)    All other components within normal limits  URINALYSIS, ROUTINE W REFLEX MICROSCOPIC - Abnormal; Notable for the following components:   Color, Urine AMBER (*)    Hgb urine dipstick SMALL (*)    Bilirubin Urine SMALL (*)    Ketones, ur 5 (*)    Nitrite POSITIVE (*)    All other components within normal limits  CK - Abnormal; Notable for the following components:   Total CK 20 (*)    All other components within normal limits  GASTROINTESTINAL PANEL BY PCR, STOOL (REPLACES STOOL CULTURE)  LIPASE, BLOOD  POC OCCULT BLOOD, ED    EKG  EKG Interpretation None        Radiology No results found.  Procedures Procedures (including critical care time)  Medications Ordered in ED Medications  metroNIDAZOLE (FLAGYL) IVPB 500 mg (not administered)  0.9 %  sodium chloride infusion (not administered)  morphine (ROXANOL) 20 MG/ML concentrated solution 5-10 mg (not  administered)  ALPRAZolam (XANAX) tablet 0.25 mg (not administered)  levothyroxine (SYNTHROID, LEVOTHROID) tablet 75 mcg (not administered)  risperiDONE (RISPERDAL) tablet 0.5 mg (not administered)  enoxaparin (LOVENOX) injection 40 mg (not administered)  sodium chloride flush (NS) 0.9 % injection 3 mL (not administered)  acetaminophen (TYLENOL) tablet 650 mg (not administered)    Or  acetaminophen (TYLENOL) suppository 650 mg (not administered)  ondansetron (ZOFRAN) tablet 4 mg (not administered)    Or  ondansetron (ZOFRAN) injection 4 mg (not administered)  RESOURCE THICKENUP CLEAR (not administered)  vancomycin (VANCOCIN) 50 mg/mL oral solution 500 mg (not administered)  sodium chloride 0.9 % bolus 500 mL (0 mLs Intravenous Stopped 09/16/17 1129)  vancomycin (VANCOCIN) 50 mg/mL oral solution 125 mg (125 mg Oral Given 09/16/17 1402)     Initial Impression / Assessment and Plan / ED Course  I have reviewed the triage vital signs and the nursing notes.  Pertinent labs & imaging results that were available during my care of the patient were reviewed by me and considered in my medical decision making (see chart for details).     Patient with advanced Huntington's disease presents for evaluation of acute onset of nausea, vomiting, and diarrhea.  I spoke with patient's caregiver and close friend who states that 5 hours prior to arrival she had multiple episodes of nonbloody nonbilious emesis and almost constant diarrhea.  Patient positive for C. difficile antigen in the ED.  Abdomen is soft and nontender.  I doubt perforation.  She is DNR, however her caregiver states that she would likely  consent to antibiotic treatment with vancomycin orally.  Patient has tolerated p.o. after Zofran with EMS.  She is afebrile. Diarrhea was so extensive as to require rectal tube. Dr. Dalene Seltzer spoke with [facility] who agree to assume care of patient and bring her into the hospital for further evaluation and management under Dr. Ophelia Charter.   Final Clinical Impressions(s) / ED Diagnoses   Final diagnoses:  C. difficile diarrhea    ED Discharge Orders    None       Jeanie Sewer, PA-C 09/16/17 1554    Alvira Monday, MD 09/16/17 2154

## 2017-09-16 NOTE — H&P (Signed)
History and Physical    Sydney [name] MWN:[id number] DOB: Oct 05, 1934 DOA: 09/16/2017  **Will admit patient based on the expectation that the patient will need hospitalization/ hospital care that crosses at least 2 midnights  PCP: Merlene LaughterStoneking, Hal, MD   Attending physician: Ophelia CharterYates  Patient coming from/Resides with: Private residence  Chief Complaint: Nausea and vomiting  HPI: Sydney Barsancy S Mcaulay is a 83 y.o. female with medical history significant for end-stage Huntington's chorea, DNR status, hypothyroidism, chronic atrial fibrillation, chronic respiratory failure on nocturnal oxygen who was brought to the ER by family after having 5 hours straight of nausea and vomiting.  Upon arrival to the ER patient was having profuse watery diarrhea and a rectal tube was placed.  Subsequent stool PCR positive for C. difficile.  Patient has been initiated on oral vancomycin by EDP.  Patient has remained tachycardic with suboptimal blood pressures.  Her white count was 21,200 with neutrophils 88% absolute neutrophils 18.6%, she appeared hemoconcentrated with a hemoglobin of 16.5.  Urinalysis not consistent with UTI.  CK normal.  She was mildly hyperkalemic with potassium of 5.6 with normal creatinine but elevated BUN.  EDP spoke with patient's power of attorney who requested that we actively treat patient C. difficile infection but otherwise primary focus should be on comfort.  ED Course:  Vital Signs: BP (!) 117/52 (BP Location: Right Arm)   Pulse (!) 116   Temp 98.6 F (37 C) (Axillary)   Resp 20   SpO2 94%  Lab data: Sodium 141, potassium 5.6, chloride 104, CO2 21, glucose 142, BUN 23, creatinine 0.93, anion gap 16, albumin 3.1, CK 20, white count 21,200, neutrophils 80%, absolute neutrophils 18.6%, hemoglobin 16.5, hematocrit 50.4, platelets 276,000, urinalysis abnormal with small amount of bilirubin, amber color, small hemoglobin, 5 ketones, positive nitrite, bacteria none, WBC 0-5, C. difficile  antigen as well as toxigenic C. difficile PCR positive Medications and treatments: NS times 500 cc, oral vancomycin 125 mg po x 1  Review of Systems:  **Patient unable to answer questions in context of chronic neurological debility; family no longer at bedside therefore history obtained from the medical record and nursing staff   Past Medical History:  Diagnosis Date  . Atrial fibrillation (HCC)   . Chronic respiratory failure (HCC)    nocturnal oxygen  . Complication of anesthesia    felt surgery with epidural  . GERD (gastroesophageal reflux disease)   . Huntington disease (HCC)   . Pneumonia 04/07/2014  . Thyroid disease     Past Surgical History:  Procedure Laterality Date  . ABDOMINAL HYSTERECTOMY    . APPENDECTOMY    . CHOLECYSTECTOMY  11/28/2011   Procedure: LAPAROSCOPIC CHOLECYSTECTOMY;  Surgeon: Emelia LoronMatthew Wakefield, MD;  Location: [facility];  Service: General;  Laterality: N/A;  . COLON SURGERY    . HEMORRHOID SURGERY    . TONSILLECTOMY      Social History   Socioeconomic History  . Marital status: Single    Spouse name: Not on file  . Number of children: Not on file  . Years of education: Not on file  . Highest education level: Not on file  Social Needs  . Financial resource strain: Not on file  . Food insecurity - worry: Not on file  . Food insecurity - inability: Not on file  . Transportation needs - medical: Not on file  . Transportation needs - non-medical: Not on file  Occupational History  . Not on file  Tobacco Use  . Smoking status:  Former Smoker    Last attempt to quit: 09/22/1998    Years since quitting: 18.9  . Smokeless tobacco: Never Used  Substance and Sexual Activity  . Alcohol use: No  . Drug use: No  . Sexual activity: Not on file  Other Topics Concern  . Not on file  Social History Narrative  . Not on file    Mobility: Unknown-suspect patient may be bedbound Work history: Not obtained   Allergies  Allergen Reactions  . Codeine  Itching  . Penicillins Cross Reactors Hives    Family History  Problem Relation Age of Onset  . Heart disease Mother   . Stroke Father   . Heart disease Brother   . Huntington's disease Unknown   . Diabetes type II Unknown   . Coronary artery disease Unknown      Prior to Admission medications   Medication Sig Start Date End Date Taking? Authorizing Provider  ALPRAZolam (XANAX) 0.25 MG tablet Take 1-2 tablets (0.25-0.5 mg total) by mouth 2 (two) times daily as needed for anxiety (anxiety). take 1tab 0.25mg  qdprn and 2tablets (0.5mg )qhsprn Patient taking differently: Take 0.25 mg by mouth 2 (two) times daily.  04/10/14  Yes Richarda Overlie, MD  aspirin EC 81 MG tablet Take 81 mg by mouth at bedtime.    Yes [provider]  atropine 1 % ophthalmic ointment Place under the tongue as needed (2-3  drops under tongue for excessive secretions).    Yes [provider]  clonazePAM (KLONOPIN) 0.5 MG tablet Take 1 mg by mouth at bedtime.   Yes [provider]  FLUoxetine (PROZAC) 20 MG capsule Take 20 mg by mouth daily.     Yes [provider]  haloperidol (HALDOL) 0.5 MG tablet Take 0.5 mg by mouth 2 (two) times daily.     Yes [provider]  levothyroxine (SYNTHROID, LEVOTHROID) 75 MCG tablet Take 75 mcg by mouth daily before breakfast.   Yes [provider]  omeprazole (PRILOSEC) 40 MG capsule Take 40 mg by mouth daily. 09/01/17  Yes [provider]  polyethylene glycol (MIRALAX / GLYCOLAX) packet Take 17 g by mouth daily. For constipation. Patient taking differently: Take 17 g by mouth daily as needed. For constipation.   In morning coffee 04/10/14  Yes Richarda Overlie, MD  risperiDONE (RISPERDAL) 0.5 MG tablet Take 0.5 mg by mouth 2 (two) times daily. 08/25/17  Yes [provider]  senna-docusate (SENOKOT-S) 8.6-50 MG per tablet Take 2 tablets by mouth at bedtime. Patient taking differently: Take 1-2 tablets by mouth 2 (two)  times daily as needed for mild constipation.  03/10/14  Yes Ghimire, Werner Lean, MD  acetaminophen (TYLENOL) 325 MG tablet Take 650 mg by mouth every 4 (four) hours as needed (pain).    [provider]  AMBULATORY NON FORMULARY MEDICATION Morphine Sul Sol 100/46ml Sig: Administer 0.69ml every 2 hours as needed for pain/ dyspnea; Administer 0.32ml every 2 hours as needed for dyspnea/pain Patient not taking: Reported on 09/16/2017 03/06/14   Bufford Spikes L, DO  digoxin (LANOXIN) 0.25 MG tablet Take 0.125 mcg by mouth daily. Takes half a tablet    [provider]  feeding supplement, ENSURE COMPLETE, (ENSURE COMPLETE) LIQD Take 120 mLs by mouth 2 (two) times daily between meals.    [provider]  feeding supplement, ENSURE, (ENSURE) PUDG Take 1 Container by mouth 3 (three) times daily between meals. Patient not taking: Reported on 09/16/2017 04/10/14   Richarda Overlie, MD  food thickener (THICK IT) POWD With food Patient not taking: Reported on 09/16/2017 04/10/14   Richarda Overlie, MD  Maltodextrin-Xanthan Gum (RESOURCE THICKENUP CLEAR) POWD With food Patient not taking: Reported on 09/16/2017 04/10/14   Richarda Overlie, MD  meclizine (ANTIVERT) 25 MG tablet Take 25 mg by mouth 3 (three) times daily as needed. For dizziness/nausea.    [provider]  morphine (ROXANOL) 20 MG/ML concentrated solution Take 0.25-0.5 mLs (5-10 mg total) by mouth every 2 (two) hours as needed for severe pain (dyspnea/pain). Patient not taking: Reported on 09/16/2017 04/10/14   Richarda Overlie, MD  nitroGLYCERIN (NITROSTAT) 0.4 MG SL tablet Place 0.4 mg under the tongue every 5 (five) minutes x 3 doses as needed.    [provider]  ondansetron (ZOFRAN-ODT) 8 MG disintegrating tablet Take 8 mg by mouth every 8 (eight) hours as needed for nausea. For nausea. 10/02/11   Penny Pia, MD  promethazine (PHENERGAN) 25 MG suppository Place 25 mg rectally every 8 (eight) hours as needed. For nausea.     [provider]    Physical Exam: Vitals:   09/16/17 1500 09/16/17 1530 09/16/17 1545 09/16/17 1623  BP: (!) 99/54  105/60 (!) 117/52  Pulse: (!) 102 (!) 106 (!) 104 (!) 116  Resp:    20  Temp:    98.6 F (37 C)  TempSrc:    Axillary  SpO2: 92% 93% 93% 94%      Constitutional: Awakens remains quite sleepy-has difficulty holding head upright but suspect this is in the context of known neurological deficits from Huntington's chorea Eyes: PERRL, ears to have some lid lag otherwise conjunctivae normal ENMT: Mucous membranes are dry. Posterior pharynx clear of any exudate or lesions. Neck: normal, supple, no masses, no thyromegaly Respiratory: clear to auscultation bilaterally, no wheezing, no crackles. Normal respiratory effort. No accessory muscle use.  Cardiovascular: Regular rate and rhythm, no murmurs / rubs / gallops. No extremity edema. 2+ pedal pulses. No carotid bruits.  Abdomen: no tenderness, no masses palpated. No hepatosplenomegaly. Bowel sounds positive.  Musculoskeletal: no clubbing / cyanosis. No joint deformity upper and lower extremities. Good ROM, no contractures. Normal muscle tone.  Skin: no rashes, lesions, ulcers. No induration Neurologic: CN 2-12 grossly intact.  Unable to hold head upright even in supine position.  Sensation intact. Strength 2/5 x all 4 extremities.  Psychiatric: Awakens to voice, nonverbal at baseline.   Labs on Admission: I have personally reviewed following labs and imaging studies  CBC: Recent Labs  Lab 09/16/17 1026  WBC 21.2*  NEUTROABS 18.6*  HGB 16.5*  HCT 50.4*  MCV 94.2  PLT 276   Basic Metabolic Panel: Recent Labs  Lab 09/16/17 1026  NA 141  K 5.6*  CL 104  CO2 21*  GLUCOSE 142*  BUN 23*  CREATININE 0.93  CALCIUM 8.8*   GFR: CrCl cannot be calculated (Unknown ideal weight.). Liver Function Tests: Recent Labs  Lab 09/16/17 1026  AST 36  ALT 21  ALKPHOS 115  BILITOT 1.1  PROT 6.7  ALBUMIN 3.1*    Recent Labs  Lab 09/16/17 1026  LIPASE 19   No results for input(s): AMMONIA in the last 168 hours. Coagulation Profile: No results for input(s): INR, PROTIME in the last 168 hours. Cardiac Enzymes: Recent Labs  Lab 09/16/17 1224  CKTOTAL 20*   BNP (last 3 results) No results for input(s): PROBNP in the last 8760 hours. HbA1C: No results for input(s): HGBA1C in the last 72 hours. CBG:  No results for input(s): GLUCAP in the last 168 hours. Lipid Profile: No results for input(s): CHOL, HDL, LDLCALC, TRIG, CHOLHDL, LDLDIRECT in the last 72 hours. Thyroid Function Tests: No results for input(s): TSH, T4TOTAL, FREET4, T3FREE, THYROIDAB in the last 72 hours. Anemia Panel: No results for input(s): VITAMINB12, FOLATE, FERRITIN, TIBC, IRON, RETICCTPCT in the last 72 hours. Urine analysis:    Component Value Date/Time   COLORURINE AMBER (A) 09/16/2017 1120   APPEARANCEUR CLEAR 09/16/2017 1120   LABSPEC 1.030 09/16/2017 1120   PHURINE 5.0 09/16/2017 1120   GLUCOSEU NEGATIVE 09/16/2017 1120   HGBUR SMALL (A) 09/16/2017 1120   BILIRUBINUR SMALL (A) 09/16/2017 1120   KETONESUR 5 (A) 09/16/2017 1120   PROTEINUR NEGATIVE 09/16/2017 1120   UROBILINOGEN 0.2 04/06/2014 2220   NITRITE POSITIVE (A) 09/16/2017 1120   LEUKOCYTESUR NEGATIVE 09/16/2017 1120   Sepsis Labs: @LABRCNTIP (procalcitonin:4,lacticidven:4) ) Recent Results (from the past 240 hour(s))  C difficile quick scan w PCR reflex     Status: Abnormal   Collection Time: 09/16/17 11:22 AM  Result Value Ref Range Status   C Diff antigen POSITIVE (A) NEGATIVE Final   C Diff toxin NEGATIVE NEGATIVE Final   C Diff interpretation Results are indeterminate. See PCR results.  Final  C. Diff by PCR, Reflexed     Status: Abnormal   Collection Time: 09/16/17 11:22 AM  Result Value Ref Range Status   Toxigenic C. Difficile by PCR POSITIVE (A) NEGATIVE Final    Comment: Positive for toxigenic C. difficile with little to no toxin  production. Only treat if clinical presentation suggests symptomatic illness. Performed at [facility], [address] [place], [place] [address]      Radiological Exams on Admission: No results found.  Assessment/Plan Principal Problem:   Sepsis 2/2 Clostridium difficile colitis -Patient presents with 5 hours of nausea and vomiting with diarrhea and was found to be positive for C. difficile colitis -Patient presents with sepsis physiology: Tachycardia, mild acute kidney injury, white count greater than 12,000 -Continue PO vancomycin but increase dosage of 100 mg every 6 hours -Add IV Flagyl -Continue supportive care with IV fluids, antiemetics and pain medications if indicated -Patient is DNR and based on EDP report of conversation with power of attorney likely would not escalate care  Active Problems:   Acute kidney injury/Acute hyperkalemia w/ associated anion gap elevated acidosis -Secondary to nausea, vomiting and diarrhea in context of acute C. difficile infection -Continue volume resuscitation as above -Repeat BMET at 8 PM and again in a.m. -With ongoing infectious diarrhea would not utilize Kayexalate -Anticipate potassium to improve with hydration    Huntington disease  -Reported as being at end stages -Recently discharged from [facility] and now resides at home with family caring for her -Continue preadmission medications as appropriate: Xanax, Risperdal, and concentrated Roxanol -Hold Haldol unless develops agitation -Use thickeners with liquids; on pured diet at home    Atrial fibrillation, chronic  -Tachycardic and likely related to volume depletion -Not on chronic anticoagulation or beta blockers prior to admission    Chronic respiratory failure with hypoxia/nocturnal O2 -Oxygen available at bedtime    Hypothyroidism, adult -Continue Synthroid    **Additional lab, imaging and/or diagnostic evaluation at discretion of supervising physician  DVT  prophylaxis: Lovenox Code Status: DNR Family Communication: No family or POA at bedside Disposition Plan: Home Consults called: None    ELLIS,ALLISON L. ANP-BC [facility] Pager [phone]   If 7PM-7AM, please contact night-coverage [email or web address] Password  [email or web address]  09/16/2017, 4:52 PM

## 2017-09-17 DIAGNOSIS — A0472 Enterocolitis due to Clostridium difficile, not specified as recurrent: Secondary | ICD-10-CM

## 2017-09-17 DIAGNOSIS — A419 Sepsis, unspecified organism: Secondary | ICD-10-CM

## 2017-09-17 DIAGNOSIS — I482 Chronic atrial fibrillation: Secondary | ICD-10-CM

## 2017-09-17 DIAGNOSIS — E875 Hyperkalemia: Secondary | ICD-10-CM

## 2017-09-17 LAB — COMPREHENSIVE METABOLIC PANEL
ALT: UNDETERMINED U/L (ref 14–54)
ANION GAP: 12 (ref 5–15)
AST: 16 U/L (ref 15–41)
Albumin: 2.6 g/dL — ABNORMAL LOW (ref 3.5–5.0)
Alkaline Phosphatase: 84 U/L (ref 38–126)
BILIRUBIN TOTAL: 0.6 mg/dL (ref 0.3–1.2)
BUN: 24 mg/dL — AB (ref 6–20)
CO2: 21 mmol/L — ABNORMAL LOW (ref 22–32)
Calcium: 8.3 mg/dL — ABNORMAL LOW (ref 8.9–10.3)
Chloride: 107 mmol/L (ref 101–111)
Creatinine, Ser: 0.88 mg/dL (ref 0.44–1.00)
GFR calc Af Amer: >60 mL/min (ref >60)
GFR, EST NON AFRICAN AMERICAN: 60 mL/min — AB (ref >60)
Glucose, Bld: 107 mg/dL — ABNORMAL HIGH (ref 65–99)
POTASSIUM: 4.6 mmol/L (ref 3.5–5.1)
Sodium: 140 mmol/L (ref 135–145)
TOTAL PROTEIN: 5.6 g/dL — AB (ref 6.5–8.1)

## 2017-09-17 LAB — CBC
HEMATOCRIT: 45.7 % (ref 36.0–46.0)
Hemoglobin: 14.3 g/dL (ref 12.0–15.0)
MCH: 29.4 pg (ref 26.0–34.0)
MCHC: 31.3 g/dL (ref 30.0–36.0)
MCV: 94 fL (ref 78.0–100.0)
PLATELETS: 244 10*3/uL (ref 150–400)
RBC: 4.86 MIL/uL (ref 3.87–5.11)
RDW: 14.9 % (ref 11.5–15.5)
WBC: 11.5 10*3/uL — AB (ref 4.0–10.5)

## 2017-09-17 MED ORDER — VANCOMYCIN 50 MG/ML ORAL SOLUTION
125.0000 mg | Freq: Four times a day (QID) | ORAL | Status: DC
  Administered 2017-09-17 – 2017-09-22 (×19): 125 mg via ORAL
  Filled 2017-09-17 (×20): qty 2.5

## 2017-09-17 MED ORDER — ORAL CARE MOUTH RINSE
15.0000 mL | Freq: Two times a day (BID) | OROMUCOSAL | Status: DC
  Administered 2017-09-17 – 2017-09-22 (×11): 15 mL via OROMUCOSAL

## 2017-09-17 NOTE — Progress Notes (Addendum)
[facility] [id number] - [facility] - GIP RN visit at 0915 am.  This is a related and covered GIP admission of 09/16/17 with [facility] diagnosis of Huntington's Disease, per Dr. Anne FuFeldmann.  Patient has OOF DNR.  Patient had recently completed a [facility] and returned home on Monday.  Patient started having N/V/D early Wednesday morning and contact [facility] RN.  [facility] RN did go out to the home, patient was having projectile vomiting and explosive diarrhea in the home.  Patient was given phenergan supposity in the home, with no relief.  At that time EMS was initiated to come get patient and transfer to hospital for additional care.   Admission diagnosis:  Sepsis 2/2 Clostridium Difficile Colitis  Day of hospital GIP LOS:  1  Specific symptoms that are being actively addressed:  Pulled from H&P note, as related to Hospice. Sepsis 2/2 Clostridium difficile colitis -Patient found to be positive for C. Difficile colitis. -Patient presented with sepsis physiology:  Tachycardia, mild acute kidney injury, white count greater than 12,000. -Continue PO vancomycin but increase dosage of 100 mg every 6 hours. -Add IV Flagyl -Continue supportive care with IV fluids, antiemetics and pain medications, if indicated.   Acute kidney injury/Acute hyperkalemia with associated anion gap elevated acidosis -Secondary to nausea, vomiting and diarrhea in context of C. Diff infection -Continue volume resuscitation as above  Huntington disease -Continue preadmission medications as appropriate:  Xanax, Risperdal, and concentrated Roxanol. -Hold Haldol unless develops agitation -Use thickeners with liquids, on pureed diet at home.  Atrial fibrillation, chronic -Tachycardia likely related to volume depletion.  Chronic respiratory failure with hypoxial/nocturnal O2 -Oxygen available at bedtime  Hypothyroidism, adult -Continue synthroid  Of note, [facility], it  is extremely important to follow aspiration precautions when feeding/eating.  Aspiration is of utmost concern for care.   Rectal tube was placed while in the ED to help with management of diarrhea.  Fluids and antibiotics given as above stated.   Plan is to discharge patient home after symptoms are better managed and patient has been appropriately treated for C. Diff.   Patient would return home with hospice support.   Transfer summary and medication list was placed on the paper chart.   Dr. Pete GlatterStoneking and Dr. Anne FuFeldmann were contacted to advise of admission/condition.  We will continue to monitor patient while hospitalized and anticipate any discharge needs for home.  Please feel free to contact [facility] with any hospice related questions or concerns.  If patient requires discharge transportation, please use [facility], as patient is a currently [facility] patient.     Thank you,  Adele BarthelAmy Evans, RN, BSN [facility] Hospital Liaison [phone]  All hospital liaisons are now on AMION.

## 2017-09-17 NOTE — Progress Notes (Addendum)
PROGRESS NOTE   CHELISE HANGER  ZOX:[id number]    DOB: 01-Jul-1935    DOA: 09/16/2017  PCP: Merlene Laughter, MD   I have briefly reviewed patients previous medical records in [facility].  Brief Narrative:  83 year old female with PMH of end-stage Huntington's chorea, hypothyroid, chronic atrial fibrillation, chronic respiratory failure on nocturnal oxygen, recently completed 5 night respite stay at [facility] and returned home on 09/14/17 then started having profuse nausea, vomiting and diarrhea from 2/20 prompting ED visit and admission for suspected C. difficile colitis.  EDP discussed with patient's power of attorney who requested treatment for C. difficile but otherwise primary focus is of comfort.   Assessment & Plan:   Principal Problem:   Sepsis (HCC) Active Problems:   Clostridium difficile colitis   Huntington disease (HCC)   Atrial fibrillation, chronic (HCC)   Chronic respiratory failure with hypoxia/nocturnal O2   Hypothyroidism, adult   Acute kidney injury (HCC)   Acute hyperkalemia   1. C. difficile colitis: Presented with several hours of projectile vomiting and profuse diarrhea.  C. difficile testing positive for antigen, negative toxin but PCR positive.  GI panel PCR negative.  Rectal tube placed due to profuse diarrhea.  Started vancomycin 500 mg every 6 hours >reduced to 125 mg every 6 hours in the absence of shock, ileus or toxic megacolon.  Rest supportive treatment. 2. Sepsis secondary to C. difficile colitis: Management as above.  Sepsis physiology resolved. 3. Hyperkalemia: Unclear etiology.  Creatinine was normal.  Resolved. 4. Dehydration: Precipitated by GI losses in the context of poor baseline intake related to end-stage Huntington's disease. 5. Huntington's disease: Reportedly end-stage and on home hospice.  As per discussion with hospice team today, at baseline patient is bedbound, mostly nonverbal and may respond with a yes or no answer at times.   Continue preadmission medications including Xanax, Risperdal and Roxanol. 6. Chronic atrial fibrillation: Tachycardia has improved. 7. Chronic respiratory failure with hypoxia 8. Hypothyroid   DVT prophylaxis: Lovenox Code Status: DNR Family Communication: None at bedside.  Unable to reach contact listed via phone.  Need to discuss with HCPOA in regards of how aggressive we need to be in terms of drawing labs and IV fluids. Disposition: DC back to home with hospice pending clinical improvement   Consultants:  None  Procedures:  None  Antimicrobials:  Oral vancomycin    Subjective: Patient's eyes open, tracks activity in the room but nonverbal and does not follow instructions.  Has rectal tube with liquid brown stools.  No vomiting reported.  Does not appear in discomfort.  As per discussion with hospice RN, mental status at baseline.  ROS: As above.  Objective:  Vitals:   09/16/17 1545 09/16/17 1623 09/16/17 2102 09/17/17 0532  BP: 105/60 (!) 117/52 (!) 112/54 (!) 113/56  Pulse: (!) 104 (!) 116 (!) 102 99  Resp:  20 18 17   Temp:  98.6 F (37 C) 98.3 F (36.8 C) 98 F (36.7 C)  TempSrc:  Axillary    SpO2: 93% 94% 94% 96%    Examination:  General exam: Elderly female, small built, frail, chronically ill looking lying comfortably supine in bed. Respiratory system: Poor inspiratory effort but seems clear to auscultation. Respiratory effort normal. Cardiovascular system: S1 & S2 heard, RRR. No JVD, murmurs, rubs, gallops or clicks. No pedal edema. Gastrointestinal system: Abdomen is nondistended, soft and nontender. No organomegaly or masses felt. Normal bowel sounds heard.  Rectal tube +. Central nervous system: Mental status  as described above. No focal neurological deficits. Extremities: No movements of limbs appreciated at this time and may have contractures. Skin: No rashes, lesions or ulcers Psychiatry: Judgement and insight impaired. Mood & affect cannot be  assessed.     Data Reviewed: I have personally reviewed following labs and imaging studies  CBC: Recent Labs  Lab 09/16/17 1026 09/17/17 0548  WBC 21.2* 11.5*  NEUTROABS 18.6*  --   HGB 16.5* 14.3  HCT 50.4* 45.7  MCV 94.2 94.0  PLT 276 244   Basic Metabolic Panel: Recent Labs  Lab 09/16/17 1026 09/16/17 2025 09/17/17 0548  NA 141 141 140  K 5.6* 5.7* 4.6  CL 104 105 107  CO2 21* 21* 21*  GLUCOSE 142* 104* 107*  BUN 23* 23* 24*  CREATININE 0.93 0.81 0.88  CALCIUM 8.8* 9.0 8.3*   Liver Function Tests: Recent Labs  Lab 09/16/17 1026 09/17/17 0548  AST 36 16  ALT 21 QUANTITY NOT SUFFICIENT, UNABLE TO PERFORM TEST  ALKPHOS 115 84  BILITOT 1.1 0.6  PROT 6.7 5.6*  ALBUMIN 3.1* 2.6*   Cardiac Enzymes: Recent Labs  Lab 09/16/17 1224  CKTOTAL 20*     Recent Results (from the past 240 hour(s))  Gastrointestinal Panel by PCR , Stool     Status: None   Collection Time: 09/16/17 11:22 AM  Result Value Ref Range Status   Campylobacter species NOT DETECTED NOT DETECTED Final   Plesimonas shigelloides NOT DETECTED NOT DETECTED Final   Salmonella species NOT DETECTED NOT DETECTED Final   Yersinia enterocolitica NOT DETECTED NOT DETECTED Final   Vibrio species NOT DETECTED NOT DETECTED Final   Vibrio cholerae NOT DETECTED NOT DETECTED Final   Enteroaggregative E coli (EAEC) NOT DETECTED NOT DETECTED Final   Enteropathogenic E coli (EPEC) NOT DETECTED NOT DETECTED Final   Enterotoxigenic E coli (ETEC) NOT DETECTED NOT DETECTED Final   Shiga like toxin producing E coli (STEC) NOT DETECTED NOT DETECTED Final   Shigella/Enteroinvasive E coli (EIEC) NOT DETECTED NOT DETECTED Final   Cryptosporidium NOT DETECTED NOT DETECTED Final   Cyclospora cayetanensis NOT DETECTED NOT DETECTED Final   Entamoeba histolytica NOT DETECTED NOT DETECTED Final   Giardia lamblia NOT DETECTED NOT DETECTED Final   Adenovirus F40/41 NOT DETECTED NOT DETECTED Final   Astrovirus NOT DETECTED  NOT DETECTED Final   Norovirus GI/GII NOT DETECTED NOT DETECTED Final   Rotavirus A NOT DETECTED NOT DETECTED Final   Sapovirus (I, II, IV, and V) NOT DETECTED NOT DETECTED Final    Comment: Performed at [facility], [address] [place], [place] [address]  C difficile quick scan w PCR reflex     Status: Abnormal   Collection Time: 09/16/17 11:22 AM  Result Value Ref Range Status   C Diff antigen POSITIVE (A) NEGATIVE Final   C Diff toxin NEGATIVE NEGATIVE Final   C Diff interpretation Results are indeterminate. See PCR results.  Final  C. Diff by PCR, Reflexed     Status: Abnormal   Collection Time: 09/16/17 11:22 AM  Result Value Ref Range Status   Toxigenic C. Difficile by PCR POSITIVE (A) NEGATIVE Final    Comment: Positive for toxigenic C. difficile with little to no toxin production. Only treat if clinical presentation suggests symptomatic illness. Performed at [facility], [address], [place], [place] [address]          Radiology Studies: No results found.      Scheduled Meds: .  ALPRAZolam  0.25 mg Oral BID  . enoxaparin (LOVENOX) injection  40 mg Subcutaneous Q24H  . levothyroxine  75 mcg Oral QAC breakfast  . mouth rinse  15 mL Mouth Rinse BID  . risperiDONE  0.5 mg Oral BID  . sodium chloride flush  3 mL Intravenous Q12H  . vancomycin  500 mg Oral Q6H   Continuous Infusions: . sodium chloride 100 mL/hr at 09/17/17 0352     LOS: 1 day     Marcellus ScottAnand Genisis Sonnier, MD, FACP, [facility]. [facility] Pager [phone]  If 7PM-7AM, please contact night-coverage [email or web address] Password [email or web address] 09/17/2017, 3:18 PM

## 2017-09-17 NOTE — Progress Notes (Signed)
Nutrition Brief Note  Chart reviewed. Pt now transitioning to comfort care.  No further nutrition interventions warranted at this time.  Please re-consult as needed.   Carmie Lanpher A. Tamikka Pilger, RD, LDN, CDE Pager: [phone] After hours Pager: [phone]  

## 2017-09-17 NOTE — Plan of Care (Signed)
  Education: Knowledge of General Education information will improve 09/17/2017 0412 - Progressing by Olena Materobinson, Anita Mcadory G, RN Note POC reviewed with pt.- unsure what pt. Understands (nonverbal).

## 2017-09-17 NOTE — Progress Notes (Signed)
[facility] note: Pt is a current [facility] patient at home. Pt is a DNR. This a related hospice admission. Pt lives with caregiver-[name] Andrews(long time friend). [name]-caregiver is POA and HCPOA. Pt has never been married and does not have children. Pt returned home 2/18 after a [facility] at [facility]. This MSW and [facility] LPN visited with pt on 2/19 in the home. Pt was not having any nausea, vomiting or diarrhea during that visit. Prior to hospitalization, Pt on occasion would say 1-2 words. Pt is total care. It has been caregiver's goal to keep pt home for a home death. There was no family or caregiver in the room. MSW attempted call to caregiver-[name] during visit. No answer. MSW left message. Pt opened her eyes when MSW called her name. Pt did not verbalize to this MSW. Pt appeared weak and tired. MSW provided reassurance to pt of continued care while in the hospital. Please call with questions, changes or at discharge.   Elijio Milesebbie Garner, MSW  [phone]

## 2017-09-17 NOTE — Progress Notes (Signed)
Note from nursing sec. to call Eugenio HoesSue Andrews- patient's caregiver; RN called and no response; left message for caregiver to call RN back if needed.

## 2017-09-17 NOTE — Progress Notes (Signed)
Patient had no urine output this shift. Bladder scanned for 170 cc only. 3 nurses attempted in and out cath but were unsuccessful in advancing the catheter, so no urine was returned.  A purewick external catheter is now in place. She had 500 cc of liquid stool today. She did consume about 75% of each meal and tolerated both doses of vancomycin. No signs of discomfort and no episodes of N/V.

## 2017-09-18 NOTE — Progress Notes (Signed)
PROGRESS NOTE   Sydney [name]  ZOX:[id number]    DOB: 24-Oct-1934    DOA: 09/16/2017  PCP: Merlene Laughter, MD   I have briefly reviewed patients previous medical records in [facility].  Brief Narrative:  83 year old female with PMH of end-stage Huntington's chorea, hypothyroid, chronic atrial fibrillation, chronic respiratory failure on nocturnal oxygen, recently completed 5 night respite stay at [facility] and returned home on 09/14/17 then started having profuse nausea, vomiting and diarrhea from 2/20 prompting ED visit and admission for suspected C. difficile colitis.  I discussed with patient's healthcare power of attorney on 2/22 and she advised ongoing treatment for C. difficile with oral vancomycin but no other aggressive interventions.  No labs or IV fluids.  Assessment & Plan:   Principal Problem:   Sepsis (HCC) Active Problems:   Clostridium difficile colitis   Huntington disease (HCC)   Atrial fibrillation, chronic (HCC)   Chronic respiratory failure with hypoxia/nocturnal O2   Hypothyroidism, adult   Acute kidney injury (HCC)   Acute hyperkalemia   1. C. difficile colitis: Presented with several hours of projectile vomiting and profuse diarrhea.  C. difficile testing positive for antigen, negative toxin but PCR positive.  GI panel PCR negative.  Rectal tube placed due to profuse diarrhea.  Started vancomycin 500 mg every 6 hours >reduced to 125 mg every 6 hours in the absence of shock, ileus or toxic megacolon.  Rest supportive treatment.  Improving.  Had 500 mL stool recorded before the daytime yesterday, 20 cc at night and 100 cc in back thus far according to RN.  Advised RN to accurately document intake output. 2. Sepsis secondary to C. difficile colitis: Management as above.  Sepsis physiology resolved. 3. Hyperkalemia: Unclear etiology.  Creatinine was normal.  Resolved.  No lab draws as per healthcare power of attorney. 4. Dehydration: Precipitated by GI losses in  the context of poor baseline intake related to end-stage Huntington's disease.  No IV fluids as discussed with healthcare power of attorney. 5. Huntington's disease: Reportedly end-stage and on home hospice.  As per discussion with hospice team today, at baseline patient is bedbound, mostly nonverbal and may respond with a yes or no answer at times.  Continue preadmission medications including Xanax, Risperdal and Roxanol. 6. Chronic atrial fibrillation: Tachycardia has improved. 7. Chronic respiratory failure with hypoxia 8. Hypothyroid   DVT prophylaxis: Lovenox Code Status: DNR Family Communication: I discussed in detail with patient's healthcare power of attorney Ms. Eugenio Hoes.  Updated care and answered questions.  She confirmed that we continue oral vancomycin but no labs or IV fluids.  I also discussed with the hospice team. Disposition: DC back to home with hospice pending clinical improvement, could be as early as 2/23.   Consultants:  None  Procedures:  None  Antimicrobials:  Oral vancomycin    Subjective: Much more alert than yesterday.  Looks around.  Makes 1 or 2 sounds that cannot be comprehended.  As per RN, no acute issues reported.  ROS: As above.  Objective:  Vitals:   09/17/17 1623 09/17/17 2047 09/18/17 0344 09/18/17 1339  BP: (!) 117/53 (!) 112/48 (!) 118/53 103/63  Pulse: 86 96 84 90  Resp: 18 17 17 16   Temp: 99 F (37.2 C) 99 F (37.2 C)  98.4 F (36.9 C)  TempSrc: Oral Oral  Oral  SpO2: 95% 93% 92% 94%  Weight:    59 kg (130 lb 1.1 oz)    Examination:  General exam:  Elderly female, small built, frail, chronically ill looking lying comfortably supine in bed.  Appears comfortable and in no obvious distress.  Looks better than she did yesterday. Respiratory system: Poor inspiratory effort but seems clear to auscultation. Respiratory effort normal. Cardiovascular system: S1 & S2 heard, RRR. No JVD, murmurs, rubs, gallops or clicks. No pedal  edema. Gastrointestinal system: Abdomen is nondistended, soft and nontender. No organomegaly or masses felt. Normal bowel sounds heard.  Rectal tube +.  Stable without change.  Stool volume seems lesser in consistency better than yesterday Central nervous system: Mental status as described above. No focal neurological deficits. Extremities: No movements of limbs appreciated at this time and may have contractures. Skin: No rashes, lesions or ulcers Psychiatry: Judgement and insight impaired. Mood & affect cannot be assessed.     Data Reviewed: I have personally reviewed following labs and imaging studies  CBC: Recent Labs  Lab 09/16/17 1026 09/17/17 0548  WBC 21.2* 11.5*  NEUTROABS 18.6*  --   HGB 16.5* 14.3  HCT 50.4* 45.7  MCV 94.2 94.0  PLT 276 244   Basic Metabolic Panel: Recent Labs  Lab 09/16/17 1026 09/16/17 2025 09/17/17 0548  NA 141 141 140  K 5.6* 5.7* 4.6  CL 104 105 107  CO2 21* 21* 21*  GLUCOSE 142* 104* 107*  BUN 23* 23* 24*  CREATININE 0.93 0.81 0.88  CALCIUM 8.8* 9.0 8.3*   Liver Function Tests: Recent Labs  Lab 09/16/17 1026 09/17/17 0548  AST 36 16  ALT 21 QUANTITY NOT SUFFICIENT, UNABLE TO PERFORM TEST  ALKPHOS 115 84  BILITOT 1.1 0.6  PROT 6.7 5.6*  ALBUMIN 3.1* 2.6*   Cardiac Enzymes: Recent Labs  Lab 09/16/17 1224  CKTOTAL 20*     Recent Results (from the past 240 hour(s))  Gastrointestinal Panel by PCR , Stool     Status: None   Collection Time: 09/16/17 11:22 AM  Result Value Ref Range Status   Campylobacter species NOT DETECTED NOT DETECTED Final   Plesimonas shigelloides NOT DETECTED NOT DETECTED Final   Salmonella species NOT DETECTED NOT DETECTED Final   Yersinia enterocolitica NOT DETECTED NOT DETECTED Final   Vibrio species NOT DETECTED NOT DETECTED Final   Vibrio cholerae NOT DETECTED NOT DETECTED Final   Enteroaggregative E coli (EAEC) NOT DETECTED NOT DETECTED Final   Enteropathogenic E coli (EPEC) NOT DETECTED NOT  DETECTED Final   Enterotoxigenic E coli (ETEC) NOT DETECTED NOT DETECTED Final   Shiga like toxin producing E coli (STEC) NOT DETECTED NOT DETECTED Final   Shigella/Enteroinvasive E coli (EIEC) NOT DETECTED NOT DETECTED Final   Cryptosporidium NOT DETECTED NOT DETECTED Final   Cyclospora cayetanensis NOT DETECTED NOT DETECTED Final   Entamoeba histolytica NOT DETECTED NOT DETECTED Final   Giardia lamblia NOT DETECTED NOT DETECTED Final   Adenovirus F40/41 NOT DETECTED NOT DETECTED Final   Astrovirus NOT DETECTED NOT DETECTED Final   Norovirus GI/GII NOT DETECTED NOT DETECTED Final   Rotavirus A NOT DETECTED NOT DETECTED Final   Sapovirus (I, II, IV, and V) NOT DETECTED NOT DETECTED Final    Comment: Performed at [facility], [address] [place], [place] [address]  C difficile quick scan w PCR reflex     Status: Abnormal   Collection Time: 09/16/17 11:22 AM  Result Value Ref Range Status   C Diff antigen POSITIVE (A) NEGATIVE Final   C Diff toxin NEGATIVE NEGATIVE Final   C Diff interpretation Results are indeterminate. See  PCR results.  Final  C. Diff by PCR, Reflexed     Status: Abnormal   Collection Time: 09/16/17 11:22 AM  Result Value Ref Range Status   Toxigenic C. Difficile by PCR POSITIVE (A) NEGATIVE Final    Comment: Positive for toxigenic C. difficile with little to no toxin production. Only treat if clinical presentation suggests symptomatic illness. Performed at [facility], [address] [place], [place] [address]          Radiology Studies: No results found.      Scheduled Meds: . ALPRAZolam  0.25 mg Oral BID  . enoxaparin (LOVENOX) injection  40 mg Subcutaneous Q24H  . levothyroxine  75 mcg Oral QAC breakfast  . mouth rinse  15 mL Mouth Rinse BID  . risperiDONE  0.5 mg Oral BID  . sodium chloride flush  3 mL Intravenous Q12H  . vancomycin  125 mg Oral Q6H   Continuous Infusions: . sodium chloride 50 mL/hr at 09/18/17 0600      LOS: 2 days     Marcellus ScottAnand Avaline Stillson, MD, FACP, [facility]. [facility] Pager [phone]  If 7PM-7AM, please contact night-coverage [email or web address] Password [email or web address] 09/18/2017, 2:45 PM

## 2017-09-18 NOTE — Progress Notes (Addendum)
[place] - GIP RN visit at 0930 am.  This is a related and covered GIP admission of 09/16/17 with [facility] Huntington's Disease, per Dr. Karie Georges.  Patient has [place] DNR.    Met with patient at bedside today.  Patient did not respond to voice.  Patient was sleeping with good rise and fall of chest.  Patient continues to have rectal tube to measure the amount of bowel output.  If return yielded continues to decrease, they will consider discharge to home.  Patient had 500 cc of liquid stool yesterday. Patient continues to receive vancomycin (VANCOCIN) 50 mg/mL oral solution 125 mg, Dose 125 mg, Q6H via PO for C. Diff.  Patient did have decreased urine output for last nursing shift per RN report.  Purewick external catheter was placed.  Patient was able to consume 75% of each meal yesterday and did tolerate both doses of vancomycin when given. Patient continues to receive 0.9% NaCl infusion for hydration.  Admission diagnosis:  Sepsis 2/2 Clostridium Difficile Colitis  Day of hospital GIP LOS:  2  Of note, per [facility] homecare team, it is extremely important to follow aspiration precautions when feeding/eating.  Aspiration is of utmost concern for care.   Plan is to discharge patient home after symptoms are resolved.   Patient would return home with hospice support.   We will continue to monitor patient while hospitalized and anticipate any discharge needs for home.  Please feel free to contact [facility] with any hospice related questions or concerns.  If patient requires discharge transportation, please use [facility], as patient is a currently [facility] patient.     Thank you,  Edyth Gunnels, RN, BSN [facility] Liaison [phone]  All hospital liaisons are now on [place].   **UPDATE - met with Harmon Pier, [place] MSW; Orinda Kenner Primary RN at bedside to discuss care of patient.  Collie Siad, [facility], with many questions about patient condition now versus going home  and the care that she needs to receive.  Collie Siad is at this time undecided on care in the home and if she can continue to care for patient in the home.  She will be better able to make a decision based on patient condition closer to discharge.  Collie Siad did express the desire for continued comfort care with continuation of antibiotic therapy to treat C. Diff.  Per Cristela Blue, RN, request to have diet changed to Nectar thick liquids.  I did speak with bedside RN who advised that he put in order.   **

## 2017-09-18 NOTE — Progress Notes (Signed)
[facility] MSW note: This is related hospice admission. Pt is a DNR. Pt was more alert and engaging today. Pt did smile at this MSW. Joint visit with Edyth Gunnels, [facility] RN liaison; Cristela Blue, primary RN; and Dr. Cherie Ouch, [facility] MD to meet with Collie Siad, caregiver to discuss pt's medical status, hospital discharge plans and hospice recertification purposes. MSW and RNs educated caregiver on rectal tube and C-Diff. Collie Siad expressed concern about taking pt home with continued diarrhea and vomiting. Collie Siad is the sole caregiver to pt and has been caring for pt over 10 years. There is no other family or friends to care for pt if Collie Siad is not able to care for her. Collie Siad stated that if pt's symptoms are not managed well she may need to consider skilled nursing placement .Collie Siad would like to take pt home if her symptoms are managed and she no longer has the rectal tube. Met with Suezanne Jacquet, Staff RN with [name]-caregiver to discuss case and care concerns. MSW asked if staff doctor could contact Collie Siad. Suezanne Jacquet, RN sent message to Dr. Algis Liming. Dr. Algis Liming called back to discuss pt's case with Collie Siad by phone. He presented options to [name]-caregiver of aggressive care or comfort care. Collie Siad was uncertain what to do. MSW provided more education on comfort care versus aggressive care. Collie Siad decided comfort care for pt which includes to continue vancomycin but no labs or fluids. Collie Siad wants to think about taking pt home or seeking skilled nursing placement. MSW spoke with [facility] social worker prior to visit to discuss case.  Rhys Martini [phone]

## 2017-09-19 NOTE — Social Work (Signed)
CS[facility], and her caregiver Fannie KneeSue. CSW spoke with [facility], Debbie regarding pt needs. Pt caregiver is looking for support for pt care for a few days as she gets her house cleaned up and ready to take pt back for continued care. Pt has been transferred to full comfort care and pt caregiver would like for her to be able to come back home but wants to make sure home is prepared for pt. Pt CSW from [facility] expressed that pt caregiver is aware that they may have to private pay for a few days until they can get pt back to home.   CSW will work up pt for SNF and see if there are any that pt caregiver is amenable to private paying for a short term stay at. CSW has permission from [place] and CSW to send out FL2.   CSW continuing to follow and will update Debbie, [facility] CSW, and Fannie KneeSue, pt caregiver with bed offers.  Doy HutchingIsabel H Yoseline [name], LCSWA [facility] Clinical Social Work [phone]

## 2017-09-19 NOTE — Progress Notes (Signed)
[place] -- GIP RN visit at [phone] am  This is a related and covered GIP admission of 09/16/17 with [facility] diagnosis of Huntington's disease, per Dr. Karie Georges. Patient has [place] DNR. Patient started having N/V/D early Wednesday morning and primary caregiver Collie Siad, contacted [facility] RN. [facility] RN visited patient in the home, patient was having projectile vomiting and explosive diarrhea. Patient was given Phenergan suppository without relief. At that time EMS was initiated to transfer patient to the hospital for additional care.  Admission diagnosis: Sepsis 2/2 Clostridium Difficile Colitis  Day of hospital GIP LOS: 3  Met with patient at bedside. Nursing tech is feeding patient breakfast, cranberry and apple juice by spoon, which patient is tolerating well sitting upright. Patient  did say hi upon my arrival and smiled. She does not appear to be in pain. Rectal tube was discontinued this morning. Pt has Purewick Catheter that has black colored fluid in suction canister. No other visitors are present. Received report from bedside RN, Suezanne Jacquet. Discussion about continuing IVF as per Dr. Janelle Floor note 09/18/17 labs and IVF to be discontinued.  Continuous medications being received include 0.9% sodium chloride infusion at 50 cc/hr.  She has not received any prn medications.   Plan at this time is to discharge back to home with hospice support when clinically stable and symptoms are well managed.  Communication with [facility]G: No one present at time of visit.  Communication with IDG: Marilynne Halsted, [facility] CSW updated.  [facility] will continue to monitor patient while in hospital and anticipate any discharge needs for home. Please feel free to call with any hospice related questions or concerns.  When patient is ready to discharge, please call [facility] for contracted transportation needs.  Thank you,  Margaretmary Eddy, RN, [facility] Liaison [phone]  [place] are  on AMION

## 2017-09-19 NOTE — NC FL2 (Addendum)
[place] MEDICAID FL2 LEVEL OF CARE SCREENING TOOL     IDENTIFICATION  Patient Name: Sydney [name] Birthdate: Dec 23, 1934 Sex: female Admission Date (Current Location): 09/16/2017  [facility] and [place] Number:  Producer, television/film/video and Address:  [facility] [place], [address] [place], [place] [address]      Provider Number: [id number]  Attending Physician Name and Address:  Elease Etienne, MD  Relative Name and Phone Number:   Eugenio Hoes, [phone]    Current Level of Care: Hospital Recommended Level of Care: Skilled Nursing Facility Prior Approval Number:    Date Approved/Denied:   PASRR Number: [id number]  Discharge Plan: SNF    Current Diagnoses: Patient Active Problem List   Diagnosis Date Noted  . Sepsis (HCC) 09/16/2017  . Clostridium difficile colitis 09/16/2017  . Huntington disease (HCC) 09/16/2017  . Atrial fibrillation, chronic (HCC) 09/16/2017  . Chronic respiratory failure with hypoxia/nocturnal O2 09/16/2017  . Hypothyroidism, adult 09/16/2017  . Acute kidney injury (HCC) 09/16/2017  . Acute hyperkalemia 09/16/2017  . Malnutrition of moderate degree (HCC) 04/07/2014  . HCAP (healthcare-associated pneumonia) 04/06/2014  . Pneumonia 03/08/2014  . Status post laparoscopic cholecystectomy 11/30/2011  . UTI (lower urinary tract infection) 11/27/2011  . Constipation 11/24/2011  . Hypothyroidism 11/24/2011  . Vertigo 09/24/2011  . Aspiration pneumonia (HCC) 09/23/2011  . Huntington's disease (HCC) 09/23/2011  . [place]-induced atrial fibrillation 09/23/2011    Orientation RESPIRATION BLADDER Height & Weight     Self  Normal Incontinent, External catheter Weight: 130 lb 1.1 oz (59 kg) Height:     BEHAVIORAL SYMPTOMS/MOOD NEUROLOGICAL BOWEL NUTRITION STATUS      Incontinent Diet(thickened liquids, comfort feeds)  AMBULATORY STATUS COMMUNICATION OF NEEDS Skin   Total Care Non-Verbally Normal                        Personal Care Assistance Level of Assistance  Total care Bathing Assistance: Maximum assistance(bed bound) Feeding assistance: Maximum assistance(bed bound) Dressing Assistance: Maximum assistance(bed bound) Total Care Assistance: Maximum assistance (bed bound)   Functional Limitations Info  Sight, Hearing, Speech Sight Info: Adequate Hearing Info: Adequate Speech Info: Impaired(nonverbal)    SPECIAL CARE FACTORS FREQUENCY                       Contractures Contractures Info: Present    Additional Factors Info  Code Status, Allergies Code Status Info: DNR Allergies Info: CODEINE, PENICILLINS CROSS REACTORS            Current Medications (09/19/2017):  This is the current hospital active medication list Current Facility-Administered Medications  Medication Dose Route Frequency Provider Last Rate Last Dose  . acetaminophen (TYLENOL) tablet 650 mg  650 mg Oral Q6H PRN Russella Dar, NP       Or  . acetaminophen (TYLENOL) suppository 650 mg  650 mg Rectal Q6H PRN Russella Dar, NP      . ALPRAZolam Prudy Feeler) tablet 0.25 mg  0.25 mg Oral BID Russella Dar, NP   0.25 mg at 09/19/17 6237  . enoxaparin (LOVENOX) injection 40 mg  40 mg Subcutaneous Q24H Russella Dar, NP   40 mg at 09/18/17 1639  . levothyroxine (SYNTHROID, LEVOTHROID) tablet 75 mcg  75 mcg Oral QAC breakfast Russella Dar, NP   75 mcg at 09/19/17 0753  . MEDLINE mouth rinse  15 mL Mouth Rinse BID Hongalgi, Maximino Greenland, MD  15 mL at 09/19/17 0939  . morphine 10 MG/5ML solution 5-10 mg  5-10 mg Oral Q2H PRN Russella DarEllis, Allison L, NP      . ondansetron [facility](ZOFRAN) tablet 4 mg  4 mg Oral Q6H PRN Russella DarEllis, Allison L, NP       Or  . ondansetron [facility](ZOFRAN) injection 4 mg  4 mg Intravenous Q6H PRN Russella DarEllis, Allison L, NP   4 mg at 09/17/17 0954  . RESOURCE THICKENUP CLEAR   Oral PRN Russella DarEllis, Allison L, NP      . risperiDONE (RISPERDAL) tablet 0.5 mg  0.5 mg Oral BID Russella DarEllis, Allison L, NP   0.5 mg at 09/19/17 [id number]  .  sodium chloride flush (NS) 0.9 % injection 3 mL  3 mL Intravenous Q12H Russella DarEllis, Allison L, NP   3 mL at 09/19/17 0941  . vancomycin (VANCOCIN) 50 mg/mL oral solution 125 mg  125 mg Oral Q6H Hongalgi, Maximino GreenlandAnand D, MD   125 mg at 09/19/17 1232     Discharge Medications: Please see discharge summary for a list of discharge medications.  Relevant Imaging Results:  Relevant Lab Results:   Additional Information SS# [id number]  Isabel H [place], [place]

## 2017-09-19 NOTE — Progress Notes (Signed)
Flexi seal removed this morning as ordered by MD. No bowel movement noted since rectal tube removal

## 2017-09-19 NOTE — Progress Notes (Signed)
PROGRESS NOTE   Sydney [name]  ZOX:[id number]    DOB: 06-16-1935    DOA: 09/16/2017  PCP: Merlene LaughterStoneking, Hal, MD   I have briefly reviewed patients [facility].  Brief Narrative:  83 year old female with PMH of end-stage Huntington's chorea, hypothyroid, chronic atrial fibrillation, chronic respiratory failure on nocturnal oxygen, recently completed 5 night respite stay at [facility] and returned home on 09/14/17 then started having profuse nausea, vomiting and diarrhea from 2/20 prompting ED visit and admission for suspected C. difficile colitis.  I discussed with patient's healthcare power of attorney on 2/22 and she advised ongoing treatment for C. difficile with oral vancomycin but no other aggressive interventions.  No labs or IV fluids.  Assessment & Plan:   Principal Problem:   Sepsis (HCC) Active Problems:   Clostridium difficile colitis   Huntington disease (HCC)   Atrial fibrillation, chronic (HCC)   Chronic respiratory failure with hypoxia/nocturnal O2   Hypothyroidism, adult   Acute kidney injury (HCC)   Acute hyperkalemia   1. C. difficile colitis: Presented with several hours of projectile vomiting and profuse diarrhea.  C. difficile testing positive for antigen, negative toxin but PCR positive.  GI panel PCR negative.  Rectal tube placed due to profuse diarrhea.  Started vancomycin 500 mg every 6 hours >reduced to 125 mg every 6 hours in the absence of shock, ileus or toxic megacolon.  Rest supportive treatment.  Significantly improved.  Rectal tube removed several hours ago this morning without any further BM.  Stable for discharge from a medical standpoint. HCPOA undecided regarding DC home versus SNF, awaiting update. 2. Sepsis secondary to C. difficile colitis: Management as above.  Sepsis resolved. 3. Hyperkalemia: Unclear etiology.  Creatinine was normal.  Resolved.  No lab draws as per healthcare power of attorney. 4. Dehydration:  Precipitated by GI losses in the context of poor baseline intake related to end-stage Huntington's disease.  No IV fluids as discussed with healthcare power of attorney. 5. Huntington's disease: Reportedly end-stage and on home hospice.  As per discussion with hospice team today, at baseline patient is bedbound, mostly nonverbal and may respond with a yes or no answer at times.  Continue preadmission medications including Xanax, Risperdal and Roxanol. 6. Chronic atrial fibrillation: Tachycardia has improved. 7. Chronic respiratory failure with hypoxia 8. Hypothyroid   DVT prophylaxis: Lovenox Code Status: DNR Family Communication: none at bedside today.  RN has communicated with HCPOA and waiting to hear back regarding disposition. Disposition: As above.   Consultants:  None  Procedures:  None  Antimicrobials:  Oral vancomycin    Subjective: Patient nonverbal.  Tracks activity with her eyes but does not say anything.  Does not follow instructions.  As per nursing, diarrhea has significantly improved.  Removed rectal tube this morning several hours ago and no BM since.  No pain reported.  ROS: As above.  Objective:  Vitals:   09/18/17 2247 09/19/17 0444 09/19/17 0803 09/19/17 1337  BP: 116/60 118/66 108/72 117/66  Pulse: 84 82 72 79  Resp: 17 18 17 18   Temp: 98.3 F (36.8 C) 98.4 F (36.9 C) 98.2 F (36.8 C) 98.4 F (36.9 C)  TempSrc:  Axillary Oral Axillary  SpO2: 92% 92% 98% 97%  Weight:        Examination:  General exam: Elderly female, small built, frail, chronically ill looking lying comfortably propped up in bed.  Does not appear in any distress. Respiratory system: Poor inspiratory effort but  seems clear to auscultation. Respiratory effort normal. Cardiovascular system: S1 & S2 heard, RRR. No JVD, murmurs, rubs, gallops or clicks. No pedal edema. Gastrointestinal system: Abdomen is nondistended, soft and nontender. No organomegaly or masses felt. Normal bowel  sounds heard.  Stable.   Central nervous system: Mental status as described above. No focal neurological deficits. Extremities: No movements of limbs appreciated at this time and may have contractures. Skin: No rashes, lesions or ulcers Psychiatry: Judgement and insight impaired. Mood & affect cannot be assessed.     Data Reviewed: I have personally reviewed following labs and imaging studies  CBC: Recent Labs  Lab 09/16/17 1026 09/17/17 0548  WBC 21.2* 11.5*  NEUTROABS 18.6*  --   HGB 16.5* 14.3  HCT 50.4* 45.7  MCV 94.2 94.0  PLT 276 244   Basic Metabolic Panel: Recent Labs  Lab 09/16/17 1026 09/16/17 2025 09/17/17 0548  NA 141 141 140  K 5.6* 5.7* 4.6  CL 104 105 107  CO2 21* 21* 21*  GLUCOSE 142* 104* 107*  BUN 23* 23* 24*  CREATININE 0.93 0.81 0.88  CALCIUM 8.8* 9.0 8.3*   Liver Function Tests: Recent Labs  Lab 09/16/17 1026 09/17/17 0548  AST 36 16  ALT 21 QUANTITY NOT SUFFICIENT, UNABLE TO PERFORM TEST  ALKPHOS 115 84  BILITOT 1.1 0.6  PROT 6.7 5.6*  ALBUMIN 3.1* 2.6*   Cardiac Enzymes: Recent Labs  Lab 09/16/17 1224  CKTOTAL 20*     Recent Results (from the past 240 hour(s))  Gastrointestinal Panel by PCR , Stool     Status: None   Collection Time: 09/16/17 11:22 AM  Result Value Ref Range Status   Campylobacter species NOT DETECTED NOT DETECTED Final   Plesimonas shigelloides NOT DETECTED NOT DETECTED Final   Salmonella species NOT DETECTED NOT DETECTED Final   Yersinia enterocolitica NOT DETECTED NOT DETECTED Final   Vibrio species NOT DETECTED NOT DETECTED Final   Vibrio cholerae NOT DETECTED NOT DETECTED Final   Enteroaggregative E coli (EAEC) NOT DETECTED NOT DETECTED Final   Enteropathogenic E coli (EPEC) NOT DETECTED NOT DETECTED Final   Enterotoxigenic E coli (ETEC) NOT DETECTED NOT DETECTED Final   Shiga like toxin producing E coli (STEC) NOT DETECTED NOT DETECTED Final   Shigella/Enteroinvasive E coli (EIEC) NOT DETECTED NOT  DETECTED Final   Cryptosporidium NOT DETECTED NOT DETECTED Final   Cyclospora cayetanensis NOT DETECTED NOT DETECTED Final   Entamoeba histolytica NOT DETECTED NOT DETECTED Final   Giardia lamblia NOT DETECTED NOT DETECTED Final   Adenovirus F40/41 NOT DETECTED NOT DETECTED Final   Astrovirus NOT DETECTED NOT DETECTED Final   Norovirus GI/GII NOT DETECTED NOT DETECTED Final   Rotavirus A NOT DETECTED NOT DETECTED Final   Sapovirus (I, II, IV, and V) NOT DETECTED NOT DETECTED Final    Comment: Performed at [facility], [address] [place], [place] [address]  C difficile quick scan w PCR reflex     Status: Abnormal   Collection Time: 09/16/17 11:22 AM  Result Value Ref Range Status   C Diff antigen POSITIVE (A) NEGATIVE Final   C Diff toxin NEGATIVE NEGATIVE Final   C Diff interpretation Results are indeterminate. See PCR results.  Final  C. Diff by PCR, Reflexed     Status: Abnormal   Collection Time: 09/16/17 11:22 AM  Result Value Ref Range Status   Toxigenic C. Difficile by PCR POSITIVE (A) NEGATIVE Final    Comment: Positive for toxigenic C.  difficile with little to no toxin production. Only treat if clinical presentation suggests symptomatic illness. Performed at [facility], [address] [place], [place] [address]          Radiology Studies: No results found.      Scheduled Meds: . ALPRAZolam  0.25 mg Oral BID  . enoxaparin (LOVENOX) injection  40 mg Subcutaneous Q24H  . levothyroxine  75 mcg Oral QAC breakfast  . mouth rinse  15 mL Mouth Rinse BID  . risperiDONE  0.5 mg Oral BID  . sodium chloride flush  3 mL Intravenous Q12H  . vancomycin  125 mg Oral Q6H   Continuous Infusions:    LOS: 3 days     Marcellus Scott, MD, FACP, [facility]. [facility] Pager [phone]  If 7PM-7AM, please contact night-coverage [email or web address] Password [facility] 09/19/2017, 3:32 PM

## 2017-09-19 NOTE — Progress Notes (Signed)
Rectal Tube Removed 9:45a. Patient cleaned and washed.

## 2017-09-19 NOTE — Progress Notes (Signed)
Called and spoke to caregiver this morning who doubles up as the POA and discussed with her plans for discharge and tried to get an answer as to where pt is going after discharge. Caregiver said that she was not sure if pt was coming home or going to SNF at that time ,that she was going to ask some other family members and call reporting nurse back. Did not receive a call back and called caregiver again this afternoon but call was unanswered. voicemail left on her phone to call back and give us an update

## 2017-09-20 NOTE — Progress Notes (Signed)
PROGRESS NOTE   Sydney [name]  ZOX:[id number]    DOB: [birth year]    DOA: 09/16/2017  PCP: Merlene LaughterStoneking, Hal, MD   I have briefly reviewed patients previous medical [facility] Link.  Brief Narrative:  83 year old female with PMH of end-stage Huntington's chorea, hypothyroid, chronic atrial fibrillation, chronic respiratory failure on nocturnal oxygen, recently completed 5 night respite stay at [facility] and returned home on 09/14/17 then started having profuse nausea, vomiting and diarrhea from 2/20 prompting ED visit and admission for suspected C. difficile colitis.  I discussed with patient's healthcare power of attorney on 2/22 and she advised ongoing treatment for C. difficile with oral vancomycin but no other aggressive interventions.  Diarrhea resolved.  Assessment & Plan:   Principal Problem:   Sepsis (HCC) Active Problems:   Clostridium difficile colitis   Huntington disease (HCC)   Atrial fibrillation, chronic (HCC)   Chronic respiratory failure with hypoxia/nocturnal O2   Hypothyroidism, adult   Acute kidney injury (HCC)   Acute hyperkalemia   1. C. difficile colitis: Presented with several hours of projectile vomiting and profuse diarrhea.  C. difficile testing positive for antigen, negative toxin but PCR positive.  GI panel PCR negative.  Rectal tube placed due to profuse diarrhea.  Started vancomycin 500 mg every 6 hours >reduced to 125 mg every 6 hours in the absence of shock, ileus or toxic megacolon.  Rest supportive treatment.  Significantly improved and diarrhea seems to have resolved.  Rectal tube was removed 2/24 morning and as per nursing, patient has not had any stools in greater than 24 hours since.  Complete total 14 days of oral vancomycin. 2. Sepsis secondary to C. difficile colitis: Management as above.  Sepsis resolved. 3. Hyperkalemia: Unclear etiology.  Creatinine was normal.  Resolved.   4. Dehydration: Precipitated by GI losses in the context of  poor baseline intake related to end-stage Huntington's disease.  As per healthcare power of attorney's update today, she wishes for patient to get IV fluids if needed.  Currently not needed but patient remains at high risk for recurrent dehydration due to inconsistent oral intake. 5. Huntington's disease: Reportedly end-stage and on home hospice.  As per discussion with hospice team today, at baseline patient is bedbound, mostly nonverbal and may respond with a yes or no answer at times.  Continue preadmission medications including Xanax, Risperdal and Roxanol. 6. Chronic atrial fibrillation: Tachycardia has improved. 7. Chronic respiratory failure with hypoxia 8. Hypothyroid   DVT prophylaxis: Lovenox Code Status: DNR Family Communication: I discussed in detail with patient's healthcare power of attorney who indicates that home is being cleaned up after patient's recent blowout diarrhea and may take several days.  In the interim she wishes for patient to go to SNF for short-term rehab and clinical social work/hospice team coordinating same.  She also updated today that if patient requires IV fluids, she would like her to get it. Disposition: As above.   Consultants:  None  Procedures:  None  Antimicrobials:  Oral vancomycin    Subjective: Patient nonverbal.  Does not appear in any distress.  As per nursing, no BM after rectal tube was removed yesterday morning.  Continues to eat and drink.  Has been getting all her doses of oral vancomycin.  ROS: As above.  Objective:  Vitals:   09/19/17 0803 09/19/17 1337 09/19/17 2144 09/20/17 0613  BP: 108/72 117/66 140/81 (!) 138/98  Pulse: 72 79 85 90  Resp: 17 18 18  18  Temp: 98.2 F (36.8 C) 98.4 F (36.9 C) 98.4 F (36.9 C) 97.9 F (36.6 C)  TempSrc: Oral Axillary Axillary Oral  SpO2: 98% 97% 98% 100%  Weight:        Examination: No change in exam over the last 2 days.  General exam: Elderly female, small built, frail,  chronically ill looking lying comfortably propped up in bed.  Does not appear in any distress. Respiratory system: Poor inspiratory effort but seems clear to auscultation. Respiratory effort normal. Cardiovascular system: S1 & S2 heard, RRR. No JVD, murmurs, rubs, gallops or clicks. No pedal edema. Gastrointestinal system: Abdomen is nondistended, soft and nontender. No organomegaly or masses felt. Normal bowel sounds heard.  Stable.   Central nervous system: Mental status as described above. No focal neurological deficits. Extremities: No movements of limbs appreciated at this time and may have contractures. Skin: No rashes, lesions or ulcers Psychiatry: Judgement and insight impaired. Mood & affect cannot be assessed.     Data Reviewed: I have personally reviewed following labs and imaging studies  CBC: Recent Labs  Lab 09/16/17 1026 09/17/17 0548  WBC 21.2* 11.5*  NEUTROABS 18.6*  --   HGB 16.5* 14.3  HCT 50.4* 45.7  MCV 94.2 94.0  PLT 276 244   Basic Metabolic Panel: Recent Labs  Lab 09/16/17 1026 09/16/17 2025 09/17/17 0548  NA 141 141 140  K 5.6* 5.7* 4.6  CL 104 105 107  CO2 21* 21* 21*  GLUCOSE 142* 104* 107*  BUN 23* 23* 24*  CREATININE 0.93 0.81 0.88  CALCIUM 8.8* 9.0 8.3*   Liver Function Tests: Recent Labs  Lab 09/16/17 1026 09/17/17 0548  AST 36 16  ALT 21 QUANTITY NOT SUFFICIENT, UNABLE TO PERFORM TEST  ALKPHOS 115 84  BILITOT 1.1 0.6  PROT 6.7 5.6*  ALBUMIN 3.1* 2.6*   Cardiac Enzymes: Recent Labs  Lab 09/16/17 1224  CKTOTAL 20*     Recent Results (from the past 240 hour(s))  Gastrointestinal Panel by PCR , Stool     Status: None   Collection Time: 09/16/17 11:22 AM  Result Value Ref Range Status   Campylobacter species NOT DETECTED NOT DETECTED Final   Plesimonas shigelloides NOT DETECTED NOT DETECTED Final   Salmonella species NOT DETECTED NOT DETECTED Final   Yersinia enterocolitica NOT DETECTED NOT DETECTED Final   Vibrio species  NOT DETECTED NOT DETECTED Final   Vibrio cholerae NOT DETECTED NOT DETECTED Final   Enteroaggregative E coli (EAEC) NOT DETECTED NOT DETECTED Final   Enteropathogenic E coli (EPEC) NOT DETECTED NOT DETECTED Final   Enterotoxigenic E coli (ETEC) NOT DETECTED NOT DETECTED Final   Shiga like toxin producing E coli (STEC) NOT DETECTED NOT DETECTED Final   Shigella/Enteroinvasive E coli (EIEC) NOT DETECTED NOT DETECTED Final   Cryptosporidium NOT DETECTED NOT DETECTED Final   Cyclospora cayetanensis NOT DETECTED NOT DETECTED Final   Entamoeba histolytica NOT DETECTED NOT DETECTED Final   Giardia lamblia NOT DETECTED NOT DETECTED Final   Adenovirus F40/41 NOT DETECTED NOT DETECTED Final   Astrovirus NOT DETECTED NOT DETECTED Final   Norovirus GI/GII NOT DETECTED NOT DETECTED Final   Rotavirus A NOT DETECTED NOT DETECTED Final   Sapovirus (I, II, IV, and Sydney) NOT DETECTED NOT DETECTED Final    Comment: Performed at [facility], [address] [place], [place] [address]  C difficile quick scan w PCR reflex     Status: Abnormal   Collection Time: 09/16/17 11:22 AM  Result  Value Ref Range Status   C Diff antigen POSITIVE (A) NEGATIVE Final   C Diff toxin NEGATIVE NEGATIVE Final   C Diff interpretation Results are indeterminate. See PCR results.  Final  C. Diff by PCR, Reflexed     Status: Abnormal   Collection Time: 09/16/17 11:22 AM  Result Value Ref Range Status   Toxigenic C. Difficile by PCR POSITIVE (A) NEGATIVE Final    Comment: Positive for toxigenic C. difficile with little to no toxin production. Only treat if clinical presentation suggests symptomatic illness. Performed at [name], [address] [place], [place] [address]          Radiology Studies: No results found.      Scheduled Meds: . ALPRAZolam  0.25 mg Oral BID  . enoxaparin (LOVENOX) injection  40 mg Subcutaneous Q24H  . levothyroxine  75 mcg Oral QAC breakfast  . mouth rinse  15 mL Mouth  Rinse BID  . risperiDONE  0.5 mg Oral BID  . sodium chloride flush  3 mL Intravenous Q12H  . vancomycin  125 mg Oral Q6H   Continuous Infusions:    LOS: 4 days     Marcellus Scott, MD, Sydney [name], Sydney [name]. Sydney [name] Pager [phone]  If 7PM-7AM, please contact night-coverage [email or web address] Password [name] 09/20/2017, 2:31 PM

## 2017-09-20 NOTE — Progress Notes (Signed)
[place] -- GIP RN Visit at 0900 am  This is a related and covered GIP admission of 09/16/17 with [facility] diagnosis of Huntington's disease, per Dr. Karie Georges. Patient has [place] DNR. Patient started having N/V/D early Wednesday morning and primary caregiver Collie Siad, contacted [facility] RN. [facility] RN visited patient in the home, patient was having projectile vomiting and explosive diarrhea. Patient was given Phenergan suppository without relief. At that time EMS was initiated to transfer patient to the hospital for additional care.  Admission diagnosis: Sepsis 2/2 Clostridium Difficile Colitis  Day of hospital GIP LOS: 4  Met with patient at bedside. Patient is being fed applesauce by hospital staff. She is eager about eating and tolerating well sitting upright. Patient does track activity with eyes and mumbles a sound when asked simple questions. She does not appear to have any pain. No documented bowel movements since rectal tube removed yesterday. Purewick canister now with clear yellow urine. No visitors are present at this time.  No continuous medications are being administered at this time. No prn medications have been given in the last 24 hours.  Plan at this time for discharge includes awaiting information from SNF's for caregiver to evaluate private pay for short term stay for caregiver to continue to ready the home for patient to return with hospice support.  Communication with PCG: No one present at this time. Marilynne Halsted, [facility] CSW continuing to assist caregiver in evaluation of SNF for short term stay.  Communication with IDG: Marilynne Halsted, [facility] CSW update.  [facility] will continue to monitor patient while in hospital and anticipate any discharge needs. Please feel free to call with any hospice related questions or concerns.  When patient is ready to discharge, please call [facility] for contracted transportation needs.  Thank you,  Margaretmary Eddy, RN, [place] [facility] Liaison [phone]  [place] are on AMION

## 2017-09-21 MED ORDER — SENNOSIDES-DOCUSATE SODIUM 8.6-50 MG PO TABS: 1.0000 | ORAL_TABLET | Freq: Two times a day (BID) | ORAL | Status: DC | PRN

## 2017-09-21 MED ORDER — POLYETHYLENE GLYCOL 3350 17 G PO PACK: 17.0000 g | PACK | Freq: Every day | ORAL | Status: AC | PRN

## 2017-09-21 MED ORDER — ALPRAZOLAM 0.25 MG PO TABS: 0.2500 mg | ORAL_TABLET | Freq: Two times a day (BID) | ORAL | 0 refills | Status: AC

## 2017-09-21 MED ORDER — VANCOMYCIN 50 MG/ML ORAL SOLUTION: 125.0000 mg | Freq: Four times a day (QID) | ORAL | Status: AC

## 2017-09-21 NOTE — Progress Notes (Signed)
1628 Confirmed with Velora MediateBridget Cobb, LCSW- discharge will take place tomorrow morning. Respite bed is available 09/22/17. Paged Dr. Waymon AmatoHongalgi to notify him.

## 2017-09-21 NOTE — Progress Notes (Addendum)
[facility] [phone] -- [facility] -- GIP RN Visit at 11:00 am  This is a related and covered GIP admission of 09/16/17 with [facility] diagnosis of Huntington's disease, per Dr. Anne FuFeldmann. Patient has [facility] DNR. Patient started having N/V/D early Wednesday morning and primary caregiver [facility]neeSue[facility] RN visited patient in the home, patient was having projectile vomiting and explosive diarrhea. Patient was given Phenergan suppository without relief. At that time EMS was initiated to transfer patient to the hospital for additional care.  Admission diagnosis: Sepsis 2/2 Clostridium Difficile Colitis  Day of hospital GIP LOS: 5  Checked in with Bedside RN Lillia AbedLindsay who reported that patient has been more alert, tolerating bites of Clear liquids and Oral Medications, and has no reported stools since Rectal tube was discontinued 2 days ago. Visited with patient who was awake in NAD, responding to questions with mumbled speech/shaking head no to deny discomfort. No visitors at bedside.   No continuous medications are being administered at this time. No prn medications have been given in the last 24 hours.  Discharge planning: [facility], possible discharge today pending caregiver evaluation of possible SNF options (private pay for short term stay) while caregiver continues to ready the home for patient to return with hospice support.   Communication with PCG: Elijio Milesebbie Garner, [facility] CSW continuing to assist caregiver in evaluation of SNF for short term stay.  Communication with IDG: Spoke with Elijio Milesebbie Garner, [facility] CSW to update on patient status.  [facility] will continue to monitor patient while in hospital and anticipate any discharge needs. Please feel free to call with any hospice related questions or concerns.  When patient is ready to discharge, please call [facility] for contracted transportation needs.  Thank you,  Roda ShuttersSherry Gibson, RN [facility]  Liaison [phone]  [facility] are on AMION   Update at 1630  Per [facility] SW Elijio Milesebbie Garner, patient to discharge at 10:00 am in the morning to [facility] 5 day Respite Stay and she notified CSW Vesta MixerBrigitte and updated patient caregiver.   [facility] Liaison will check in on patient in the morning.

## 2017-09-21 NOTE — Clinical Social Work Placement (Addendum)
   CLINICAL SOCIAL WORK PLACEMENT  NOTE  Date:  09/22/2017 Patient Details  Name: Sydney [name] MRN: [id number] Date of Birth: Mar 10, 1935  Clinical Social Work is seeking post-discharge placement for this patient at the Skilled  Nursing Facility level of care (*CSW will initial, date and re-position this form in  chart as items are completed):      Patient/family provided with [facility] Clinical Social Work Department's list of facilities offering this level of care within the geographic area requested by the patient (or if unable, by the patient's family).  Yes   Patient/family informed of their freedom to choose among providers that offer the needed level of care, that participate in Medicare, Medicaid or managed care program needed by the patient, have an available bed and are willing to accept the patient.      Patient/family informed of [place]'s ownership interest in [facility] and [facility], as well as of the fact that they are under no obligation to receive care at these facilities.  PASRR submitted to EDS on       PASRR number received on 09/19/17     Existing PASRR number confirmed on       FL2 transmitted to all facilities in geographic area requested by pt/family on 09/19/17     FL2 transmitted to all facilities within larger geographic area on       Patient informed that his/her managed care company has contracts with or will negotiate with certain facilities, including the following:        Yes   Patient/family informed of bed offers received.  [facility]    Physician recommends and patient chooses bed at      [facility].  [facility]     Patient family notified on 09/22/2017 of transfer.  Name of family member notified:  Fannie KneeSue, caregiver & Debbie CSW at [facility]     PHYSICIAN Please prepare priority  discharge summary, including medications, Please prepare prescriptions, Please sign FL2, Please sign DNR     Additional Comment:    _______________________________________________ Maree KrabbeBridget A Cobb, LCSW 09/21/2017, 1:50 PM

## 2017-09-21 NOTE — Discharge Instructions (Signed)
Clostridium Difficile Infection   Clostridium difficile (C. difficile or C. diff) infection causes inflammation of the large intestine (colon). This condition can result in damage to the lining of your colon and may lead to another condition called colitis. This infection can be passed from person to person (is contagious).  Follow these instructions at home:  Eating and drinking   · Drink enough fluid to keep your pee (urine) clear or pale yellow.  · Avoid drinking:  ? Milk.  ? Caffeine.  ? Alcohol.  · Follow exact instructions from your doctor about how to get enough fluid in your body (rehydrate).  · Eat small meals often instead of large meals.  Medicines   · Take your antibiotic medicine as told by your doctor. Do not stop taking the antibiotic even if you start to feel better unless your doctor told you to do that.  · Take over-the-counter and prescription medicines only as told by your doctor.  · Do not use medicines to help with watery poop (diarrhea).  General instructions   · Wash your hands fully before you prepare food and after you use the bathroom. Make sure people who live with you also wash their  · hands often.  · Clean the surfaces that you touch. Use a product that contains chlorine bleach.  · Keep all follow-up visits as told by your doctor. This is important.  Contact a doctor if:  · Your symptoms do not get better with treatment.  · Your symptoms get worse with treatment.  · Your symptoms go away and then come back.  · You have a fever.  · You have new symptoms.  Get help right away if:  · You have more pain or tenderness in your belly (abdomen).  · Your poop (stool) is mostly bloody.  · Your poop looks dark black and tarry.  · You cannot eat or drink without throwing up (vomiting).  · You have signs of dehydration, such as:  ? Dark pee, very little pee, or no pee.  ? Cracked lips.  ? Not making tears when you cry.  ? Dry mouth.  ? Sunken eyes.  ? Feeling sleepy.  ? Feeling weak.  ? Feeling  dizzy.  This information is not intended to replace advice given to you by your health care provider. Make sure you discuss any questions you have with your health care provider.  Document Released: 05/11/2009 Document Revised: 12/20/2015 Document Reviewed: 01/15/2015  Elsevier Interactive Patient Education © 2017 Elsevier Inc.

## 2017-09-21 NOTE — Discharge Summary (Addendum)
Physician Discharge Summary  Sydney [name]:[id number] DOB: 02-May-1935  PCP: Merlene Laughter, MD  Admit date: 09/16/2017 Discharge date: 09/22/2017  Recommendations for Outpatient Follow-up:  1. MD at SNF in 3 days. 2. Dr. Merlene Laughter, PCP upon discharge from SNF.  Home Health: None Equipment/Devices: None  Discharge Condition: Improved and stable CODE STATUS: DNR Diet recommendation: As prior to admission: Pured diet and nectar thickened liquids.  Discharge Diagnoses:  Principal Problem:   Sepsis (HCC) Active Problems:   Clostridium difficile colitis   Huntington disease (HCC)   Atrial fibrillation, chronic (HCC)   Chronic respiratory failure with hypoxia/nocturnal O2   Hypothyroidism, adult   Acute kidney injury (HCC)   Acute hyperkalemia   Brief Summary: 83 year old female with PMH of end-stage Huntington's chorea, hypothyroid, chronic atrial fibrillation, chronic respiratory failure on nocturnal oxygen, recently completed 5 night respite stay at [facility] and returned home on 09/14/17 then started having profuse nausea, vomiting and diarrhea from 2/20 prompting ED visit and admission for suspected C. difficile colitis.  I discussed with patient's healthcare power of attorney on 2/22 and she advised ongoing treatment for C. difficile with oral vancomycin but no other aggressive interventions.  Diarrhea resolved.  Assessment & Plan:   1. C. difficile colitis: Presented with several hours of projectile vomiting and profuse diarrhea.  C. difficile testing positive for antigen, negative toxin but PCR positive.  GI panel PCR negative.  Rectal tube placed due to profuse diarrhea.  Started vancomycin 500 mg every 6 hours >reduced to 125 mg every 6 hours in the absence of shock, ileus or toxic megacolon.  Rest supportive treatment.  Rectal tube was removed 2/23 morning and as per nursing, patient has not had any stools since.   Diarrhea resolved. Complete total 14 days of oral  vancomycin.  Discontinued PPI. 2. Sepsis secondary to C. difficile colitis: Management as above.  Sepsis resolved. 3. Hyperkalemia: Unclear etiology.  Creatinine was normal.  Resolved.   4. Dehydration: Precipitated by GI losses in the context of poor baseline intake related to end-stage Huntington's disease.  As per healthcare power of attorney's update, she wishes for patient to get IV fluids if needed.  Currently not needed but patient remains at high risk for recurrent dehydration due to inconsistent oral intake. 5. Huntington's disease: Reportedly end-stage and on home hospice.  As per discussion with hospice team, at baseline patient is bedbound, mostly nonverbal and may respond with a yes or no answer at times.  6. Chronic atrial fibrillation: Tachycardia resolved 7. Chronic respiratory failure with hypoxia: Saturating in the high 90s on room air. 8. Hypothyroid: Continue Synthroid.   Consultants:  None  Procedures:  None  Discharge Instructions  Discharge Instructions    Call MD for:   Complete by:  As directed    Recurrent nausea, vomiting or diarrhea.   Call MD for:  difficulty breathing, headache or visual disturbances   Complete by:  As directed    Call MD for:  persistant nausea and vomiting   Complete by:  As directed    Call MD for:  severe uncontrolled pain   Complete by:  As directed    Call MD for:  temperature >100.4   Complete by:  As directed    Discharge instructions   Complete by:  As directed    DIET: As prior to admission: Pured diet and nectar thickened liquids.   Increase activity slowly   Complete by:  As directed  Medication List    STOP taking these medications   acetaminophen 325 MG tablet Commonly known as:  TYLENOL   AMBULATORY NON FORMULARY MEDICATION   clonazePAM 0.5 MG tablet Commonly known as:  KLONOPIN   digoxin 0.25 MG tablet Commonly known as:  LANOXIN   feeding supplement (ENSURE COMPLETE) Liqd   feeding  supplement (ENSURE) Pudg   food thickener Powd Commonly known as:  THICK IT   haloperidol 0.5 MG tablet Commonly known as:  HALDOL   meclizine 25 MG tablet Commonly known as:  ANTIVERT   morphine 20 MG/ML concentrated solution Commonly known as:  ROXANOL   nitroGLYCERIN 0.4 MG SL tablet Commonly known as:  NITROSTAT   omeprazole 40 MG capsule Commonly known as:  PRILOSEC   ondansetron 8 MG disintegrating tablet Commonly known as:  ZOFRAN-ODT   promethazine 25 MG suppository Commonly known as:  PHENERGAN   RESOURCE THICKENUP CLEAR Powd     TAKE these medications   ALPRAZolam 0.25 MG tablet Commonly known as:  XANAX Take 1 tablet (0.25 mg total) by mouth 2 (two) times daily.   aspirin EC 81 MG tablet Take 81 mg by mouth at bedtime.   atropine 1 % ophthalmic ointment Place under the tongue as needed (2-3  drops under tongue for excessive secretions).   FLUoxetine 20 MG capsule Commonly known as:  PROZAC Take 20 mg by mouth daily.   levothyroxine 75 MCG tablet Commonly known as:  SYNTHROID, LEVOTHROID Take 75 mcg by mouth daily before breakfast.   polyethylene glycol packet Commonly known as:  MIRALAX / GLYCOLAX Take 17 g by mouth daily as needed. For constipation.   In morning coffee What changed:    when to take this  reasons to take this  additional instructions   risperiDONE 0.5 MG tablet Commonly known as:  RISPERDAL Take 0.5 mg by mouth 2 (two) times daily.   senna-docusate 8.6-50 MG tablet Commonly known as:  Senokot-S Take 1-2 tablets by mouth 2 (two) times daily as needed for mild constipation.   vancomycin 50 mg/mL oral solution Commonly known as:  VANCOCIN Take 2.5 mLs (125 mg total) by mouth every 6 (six) hours. Discontinue after 09/29/2017 doses.       Contact information for follow-up providers    MD at SNF. Schedule an appointment as soon as possible for a visit in 3 day(s).        Merlene LaughterStoneking, Hal, MD. Schedule an appointment as  soon as possible for a visit.   Specialty:  Internal Medicine Why:  Upon discharge from SNF Contact information: [address] [place] [address] [phone]            Contact information for after-discharge care    Destination    [facility] .   Service:  Skilled Nursing Contact information: [address] [place] [id number] [phone]                 Allergies  Allergen Reactions  . Codeine Itching  . Penicillins Cross Reactors Hives      Procedures/Studies: No results found.    Subjective: Patient is nonverbal.  Tracks with eyes.  Does not follow instructions.  As per nursing, no BM since rectal tube was removed 09/19/17 morning and no acute issues reported.  Discharge Exam:  Vitals:   09/21/17 0515 09/21/17 1100 09/21/17 2255 09/22/17 0359  BP: 116/60 134/75 (!) 107/58 (!) 103/52  Pulse: 99 90 85  85  Resp:      Temp: 97.9 F (36.6 C) 98 F (36.7 C) 98.1 F (36.7 C) 97.7 F (36.5 C)  TempSrc: Axillary Axillary Oral Axillary  SpO2: 98% 99% 97% 99%  Weight:        General exam: Elderly female, small built, frail, chronically ill looking lying comfortably propped up in bed.  Does not appear in any distress. Respiratory system: Poor inspiratory effort but seems clear to auscultation. Respiratory effort normal. Cardiovascular system: S1 & S2 heard, RRR. No JVD, murmurs, rubs, gallops or clicks. No pedal edema. Gastrointestinal system: Abdomen is nondistended, soft and nontender. No organomegaly or masses felt. Normal bowel sounds heard.  Central nervous system: Mental status as described above. No focal neurological deficits. Extremities: No movements of limbs appreciated at this time and may have contractures. Skin: No rashes, lesions or ulcers Psychiatry: Judgement and insight impaired. Mood & affect cannot be assessed.      The results of significant diagnostics from this  hospitalization (including imaging, microbiology, ancillary and laboratory) are listed below for reference.     Microbiology: Recent Results (from the past 240 hour(s))  Gastrointestinal Panel by PCR , Stool     Status: None   Collection Time: 09/16/17 11:22 AM  Result Value Ref Range Status   Campylobacter species NOT DETECTED NOT DETECTED Final   Plesimonas shigelloides NOT DETECTED NOT DETECTED Final   Salmonella species NOT DETECTED NOT DETECTED Final   Yersinia enterocolitica NOT DETECTED NOT DETECTED Final   Vibrio species NOT DETECTED NOT DETECTED Final   Vibrio cholerae NOT DETECTED NOT DETECTED Final   Enteroaggregative E coli (EAEC) NOT DETECTED NOT DETECTED Final   Enteropathogenic E coli (EPEC) NOT DETECTED NOT DETECTED Final   Enterotoxigenic E coli (ETEC) NOT DETECTED NOT DETECTED Final   Shiga like toxin producing E coli (STEC) NOT DETECTED NOT DETECTED Final   Shigella/Enteroinvasive E coli (EIEC) NOT DETECTED NOT DETECTED Final   Cryptosporidium NOT DETECTED NOT DETECTED Final   Cyclospora cayetanensis NOT DETECTED NOT DETECTED Final   Entamoeba histolytica NOT DETECTED NOT DETECTED Final   Giardia lamblia NOT DETECTED NOT DETECTED Final   Adenovirus F40/41 NOT DETECTED NOT DETECTED Final   Astrovirus NOT DETECTED NOT DETECTED Final   Norovirus GI/GII NOT DETECTED NOT DETECTED Final   Rotavirus A NOT DETECTED NOT DETECTED Final   Sapovirus (I, II, IV, and V) NOT DETECTED NOT DETECTED Final    Comment: Performed at [facility], [address] [place], [place] [address]  C difficile quick scan w PCR reflex     Status: Abnormal   Collection Time: 09/16/17 11:22 AM  Result Value Ref Range Status   C Diff antigen POSITIVE (A) NEGATIVE Final   C Diff toxin NEGATIVE NEGATIVE Final   C Diff interpretation Results are indeterminate. See PCR results.  Final  C. Diff by PCR, Reflexed     Status: Abnormal   Collection Time: 09/16/17 11:22 AM  Result Value Ref  Range Status   Toxigenic C. Difficile by PCR POSITIVE (A) NEGATIVE Final    Comment: Positive for toxigenic C. difficile with little to no toxin production. Only treat if clinical presentation suggests symptomatic illness. Performed at [facility], [address], [place], [place] [address]      Labs: CBC: Recent Labs  Lab 09/16/17 1026 09/17/17 0548  WBC 21.2* 11.5*  NEUTROABS 18.6*  --   HGB 16.5* 14.3  HCT 50.4* 45.7  MCV 94.2 94.0  PLT 276 244   Basic Metabolic Panel: Recent Labs  Lab 09/16/17 1026 09/16/17 2025 09/17/17 0548  NA 141 141 140  K 5.6* 5.7* 4.6  CL 104 105 107  CO2 21* 21* 21*  GLUCOSE 142* 104* 107*  BUN 23* 23* 24*  CREATININE 0.93 0.81 0.88  CALCIUM 8.8* 9.0 8.3*   Liver Function Tests: Recent Labs  Lab 09/16/17 1026 09/17/17 0548  AST 36 16  ALT 21 QUANTITY NOT SUFFICIENT, UNABLE TO PERFORM TEST  ALKPHOS 115 84  BILITOT 1.1 0.6  PROT 6.7 5.6*  ALBUMIN 3.1* 2.6*   Cardiac Enzymes: Recent Labs  Lab 09/16/17 1224  CKTOTAL 20*   Urinalysis    Component Value Date/Time   COLORURINE AMBER (A) 09/16/2017 1120   APPEARANCEUR CLEAR 09/16/2017 1120   LABSPEC 1.030 09/16/2017 1120   PHURINE 5.0 09/16/2017 1120   GLUCOSEU NEGATIVE 09/16/2017 1120   HGBUR SMALL (A) 09/16/2017 1120   BILIRUBINUR SMALL (A) 09/16/2017 1120   KETONESUR 5 (A) 09/16/2017 1120   PROTEINUR NEGATIVE 09/16/2017 1120   UROBILINOGEN 0.2 04/06/2014 2220   NITRITE POSITIVE (A) 09/16/2017 1120   LEUKOCYTESUR NEGATIVE 09/16/2017 1120      Time coordinating discharge: Over 30 minutes  SIGNED:  Marcellus Scott, MD, FACP, [facility]. [facility] Pager [phone]  If 7PM-7AM, please contact night-coverage [email or web address] Password [facility] 09/22/2017, 10:32 AM

## 2017-09-21 NOTE — Clinical Social Work Note (Signed)
CSW provided pt's care taker with bed choices. CSW awaiting call back with choice. Pt's care taker aware MD will d/c pt today.   [place] LevelBridget Edrik Rundle, [place] [phone]

## 2017-09-22 NOTE — Progress Notes (Signed)
Progress Note  Patient nonverbal.  Sydney [name] activity around with her eyes.  As per discussion with RN, no acute issues.  No BM since 2/23.  RN confirmed with family/friends that patient was taking pured diet and nectar thickened liquids at home > was changed on discharge summary.  Patient does not appear in any distress.  Lying comfortably propped up in bed.  Stable vital signs and unchanged/stable physical exam compared to yesterday.  Patient had been discharged on 5/25 but SNF was unable to accept her.  As per nursing update, patient will be leaving today 5/26 to SNF.  Sydney ScottAnand Derry Arbogast, MD, FACP, [facility]. [facility] Pager [phone]  If 7PM-7AM, please contact night-coverage [email or web address] Password [email or web address] 09/22/2017, 10:36 AM

## 2017-09-22 NOTE — Progress Notes (Signed)
[facility] [phone] -- [facility] -- GIP RN Visit   This is a related and coveredGIP admission of 09/16/17 with [facility] diagnosis of Huntington's disease, per Dr. Anne FuFeldmann. Patient has OOF DNR. Patient started having N/V/D early Wednesday morning and primary caregiver Fannie KneeSue, [facility]. [facility] RN visited patient in the home, patient was having projectile vomiting and explosive diarrhea. Patient was given Phenergan suppository without relief. At that time EMS was initiated to transfer patient to the hospital for additional care.  Admission diagnosis: Sepsis 2/2 Clostridium Difficile Colitis  Day of hospital GIP LOS:6  Visited patient at bedside, patient sleeping and appears comfortable, no apparent distress. No family or visitors present. Aid reports she ate well for breakfast.   No continuous medications are being administered at this time. No prn medications have been given in the last 24 hours.  Discharge planning: The plan is for [facility]. RN will call report and Rutherford Nailsabel, [facility] is arranging [facility].   Communication with [facility]: Elijio Milesebbie Garner, [facility] CSW continuing to assist caregiver in the arrangements for [facility] to [facility].  Communication with [facility]: Spoke with Elijio Milesebbie Garner, [facility] CSW to complete arrangements for patient to [facility].   [facility] will continue to monitor patient while in hospital and anticipate any discharge needs. Please feel free to call with any hospice related questions or concerns.  When patient is ready to discharge, please call [facility] for contracted transportation needs.  Thank you,  Elsie SaasMary Anne Robertson, RN, [facility] Hospital Liaison [phone]  [facility] Liaisons are on AMION

## 2017-09-22 NOTE — Social Work (Signed)
Clinical Social Worker facilitated patient discharge including contacting patient family and facility to confirm patient discharge plans.  Clinical information faxed to facility and family agreeable with plan.  CSW [facility] Rm 313. RN to call [phone] with report prior to discharge.  Clinical Social Worker will sign off for now as social work intervention is no longer needed. Please consult us again if new need arises.  Doy HutchingIsabel H Gustave Lindeman, LCSWA Clinical Social Worker

## 2017-09-22 NOTE — Progress Notes (Signed)
[facility] in [facility]. Transported via [facility]tar. Report called and given to receiving facility nurse with no concerns voiced

## 2017-09-23 ENCOUNTER — Encounter: Payer: Self-pay | Admitting: Internal Medicine

## 2017-09-23 ENCOUNTER — Non-Acute Institutional Stay (SKILLED_NURSING_FACILITY): Admitting: Internal Medicine

## 2017-09-23 DIAGNOSIS — A0472 Enterocolitis due to Clostridium difficile, not specified as recurrent: Secondary | ICD-10-CM | POA: Diagnosis not present

## 2017-09-23 DIAGNOSIS — E860 Dehydration: Secondary | ICD-10-CM

## 2017-09-23 DIAGNOSIS — G10 Huntington's disease: Secondary | ICD-10-CM | POA: Diagnosis not present

## 2017-09-23 NOTE — Progress Notes (Signed)
Location:  [facility] Room Number: 313-A Place of Service:  SNF [phone](31) Provider:  Mahlon GammonGupta, Ilia Dimaano L., MD  Patient Care Team: Merlene LaughterStoneking, Hal, MD as PCP - General (Internal Medicine)  Extended Emergency Contact Information Primary Emergency Contact: Julieanne Cottonndrews,Kathy S Address: [phone]          [place] [id number] [place] Home Phone: [phone] Mobile Phone: [phone] Relation: Friend  Code Status:  DNR Goals of care: Advanced Directive information Advanced Directives 09/16/2017  Does Patient Have a Medical Advance Directive? No  Type of Advance Directive -  Does patient want to make changes to medical advance directive? -  Copy of Healthcare Power of Attorney in Chart? -  Would patient like information on creating a medical advance directive? -  Pre-existing out of facility DNR order (yellow form or pink MOST form) -     Chief Complaint  Patient presents with  . New Admit To SNF    [facility]    HPI:  Pt is an 83 y.o. female seen today for admission to SNF for Respite Care. Patient Has h/o Huntington Chorea, Hypothyroid, Chronic Atrial Fibrillation, On Chronic Oxygen. [facility] She started havening severe Nausea,Vomitng and diarrhea at home. In the hospital she was found to have C.Diff colitis. AN dwas started on PO Vancomycin. Patietn responded very well and her Diarrhea resolved. Rectal tube had to be placed due to her Profuse diarrhea. She needs to be on Vancomycin for total of 14 days. PPI were discontinued. She was hydrated with IV fluids. Patient is here for respite care and would be discharged home with her POA and Hospice.    Past Medical History:  Diagnosis Date  . Atrial fibrillation (HCC)   . Chronic respiratory failure (HCC)    nocturnal oxygen  . Complication of anesthesia    felt surgery with epidural  . GERD (gastroesophageal reflux disease)     . Huntington disease (HCC)   . Pneumonia 04/07/2014  . Thyroid disease    Past Surgical History:  Procedure Laterality Date  . ABDOMINAL HYSTERECTOMY    . APPENDECTOMY    . CHOLECYSTECTOMY  11/28/2011   Procedure: LAPAROSCOPIC CHOLECYSTECTOMY;  Surgeon: Emelia LoronMatthew Wakefield, MD;  Location: [facility] ORS;  Service: General;  Laterality: N/A;  . COLON SURGERY    . HEMORRHOID SURGERY    . TONSILLECTOMY      Allergies  Allergen Reactions  . Codeine Itching  . Penicillins Cross Reactors Hives    Outpatient Encounter Medications as of 09/23/2017  Medication Sig  . ALPRAZolam (XANAX) 0.25 MG tablet Take 1 tablet (0.25 mg total) by mouth 2 (two) times daily.  Marland Kitchen. aspirin EC 81 MG tablet Take 81 mg by mouth at bedtime.   Marland Kitchen. atropine 1 % ophthalmic ointment Place 1 application under the tongue every 2 (two) hours as needed (Secretions).   Marland Kitchen. FLUoxetine (PROZAC) 20 MG capsule Take 20 mg by mouth daily.    Marland Kitchen. levothyroxine (SYNTHROID, LEVOTHROID) 75 MCG tablet Take 75 mcg by mouth daily before breakfast.  . polyethylene glycol (MIRALAX / GLYCOLAX) packet Take 17 g by mouth daily as needed. For constipation.   In morning coffee  . risperiDONE (RISPERDAL) 0.5 MG tablet Take 0.5 mg by mouth 2 (two) times daily.  . sennosides-docusate sodium (SENOKOT-S) 8.6-50 MG tablet Take 2 tablets by mouth 2 (two) times daily as needed for constipation.  . vancomycin (VANCOCIN) 50 mg/mL oral solution Take  2.5 mLs (125 mg total) by mouth every 6 (six) hours. Discontinue after 09/29/2017 doses.  . [DISCONTINUED] senna-docusate (SENOKOT-S) 8.6-50 MG tablet Take 1-2 tablets by mouth 2 (two) times daily as needed for mild constipation.   No facility-administered encounter medications on file as of 09/23/2017.     Review of Systems  Unable to perform ROS: Patient nonverbal     There is no immunization history on file for this patient. Pertinent  Health Maintenance Due  Topic Date Due  . DEXA SCAN  09/13/2001  . PNA vac Low  Risk Adult (1 of 2 - PCV13) 09/13/2001  . INFLUENZA VACCINE  02/25/2017   No flowsheet data found.    Vitals:   09/23/17 1039  BP: 122/72  Pulse: 98  Resp: 19  Temp: (!) 97.1 F (36.2 C)  TempSrc: Oral  Weight: 130 lb 1.1 oz (59 kg)  Height: 5\' 6"  (1.676 m)   Body mass index is 20.99 kg/m. Physical Exam  Constitutional: She appears well-developed.  HENT:  Head: Normocephalic.  Mouth/Throat: Oropharynx is clear and moist.  Eyes: Pupils are equal, round, and reactive to light.  Neck: Neck supple.  Cardiovascular: Normal rate and normal heart sounds.  No murmur heard. Pulmonary/Chest: Effort normal and breath sounds normal. No respiratory distress. She has no wheezes. She has no rales.  Abdominal: Soft. Bowel sounds are normal. She exhibits no distension. There is no tenderness. There is no rebound.  Musculoskeletal: She exhibits no edema.  Lymphadenopathy:    She has no cervical adenopathy.  Neurological:  Alert Bu t doe snot responds or follow any Commands. Has contractures in her hands.  Skin: Skin is warm and dry.  Psychiatric:  Not Assessed.    Labs reviewed: Recent Labs    09/16/17 1026 09/16/17 2025 09/17/17 0548  NA 141 141 140  K 5.6* 5.7* 4.6  CL 104 105 107  CO2 21* 21* 21*  GLUCOSE 142* 104* 107*  BUN 23* 23* 24*  CREATININE 0.93 0.81 0.88  CALCIUM 8.8* 9.0 8.3*   Recent Labs    09/16/17 1026 09/17/17 0548  AST 36 16  ALT 21 QUANTITY NOT SUFFICIENT, UNABLE TO PERFORM TEST  ALKPHOS 115 84  BILITOT 1.1 0.6  PROT 6.7 5.6*  ALBUMIN 3.1* 2.6*   Recent Labs    09/16/17 1026 09/17/17 0548  WBC 21.2* 11.5*  NEUTROABS 18.6*  --   HGB 16.5* 14.3  HCT 50.4* 45.7  MCV 94.2 94.0  PLT 276 244   Lab Results  Component Value Date   TSH 1.857 11/24/2011   No results found for: HGBA1C No results found for: CHOL, HDL, LDLCALC, LDLDIRECT, TRIG, CHOLHDL  Significant Diagnostic Results in last 30 days:  No results  found.  Assessment/Plan  C.Diff Colitis Patient is doing well on Vanco with No Diarrhea. Total of 14 days of treatment PPI discontinued Dehydration Is now taking PO Huntington Disease Continue Xanax Patient family also wants her to have Klonopin Also on Risperdal. Roxanol and Haldol and Digoxin was stopped in th [facility]. Hospice to follow at home.  Hypothyroid Continue Synthroid.  Family/ staff Communication:   Labs/tests ordered:   Total time spent in this patient care encounter was 45_ minutes; greater than 50% of the visit reviewing records , Labs and coordinating care for problems addressed at this encounter.

## 2018-01-08 ENCOUNTER — Encounter: Payer: Self-pay | Admitting: Internal Medicine

## 2018-01-08 NOTE — Progress Notes (Signed)
This encounter was created in error - please disregard.

## 2019-01-26 DEATH — deceased
